# Patient Record
Sex: Female | Born: 1945 | Race: White | Hispanic: No | Smoking: Never smoker
Health system: Southern US, Community
[De-identification: ages and names within clinical notes are randomized; demographics above are authoritative.]

## PROBLEM LIST (undated history)

## (undated) DIAGNOSIS — I10 Essential (primary) hypertension: Secondary | ICD-10-CM

## (undated) DIAGNOSIS — C50919 Malignant neoplasm of unspecified site of unspecified female breast: Secondary | ICD-10-CM

## (undated) DIAGNOSIS — I639 Cerebral infarction, unspecified: Secondary | ICD-10-CM

## (undated) DIAGNOSIS — I509 Heart failure, unspecified: Secondary | ICD-10-CM

## (undated) DIAGNOSIS — M353 Polymyalgia rheumatica: Secondary | ICD-10-CM

## (undated) DIAGNOSIS — F419 Anxiety disorder, unspecified: Secondary | ICD-10-CM

## (undated) DIAGNOSIS — J189 Pneumonia, unspecified organism: Secondary | ICD-10-CM

## (undated) HISTORY — DX: Cerebral infarction, unspecified: I63.9

---

## 1997-04-07 DIAGNOSIS — Z8572 Personal history of non-Hodgkin lymphomas: Secondary | ICD-10-CM | POA: Insufficient documentation

## 2013-09-02 DIAGNOSIS — M353 Polymyalgia rheumatica: Secondary | ICD-10-CM

## 2013-11-07 DIAGNOSIS — F32A Depression, unspecified: Secondary | ICD-10-CM | POA: Insufficient documentation

## 2015-12-15 ENCOUNTER — Emergency Department (HOSPITAL_COMMUNITY): Payer: Medicare Other

## 2015-12-15 ENCOUNTER — Emergency Department (HOSPITAL_COMMUNITY)
Admission: EM | Admit: 2015-12-15 | Discharge: 2015-12-15 | Disposition: A | Discharge status: Home / Self Care | Payer: Medicare Other | Attending: Emergency Medicine | Admitting: Emergency Medicine

## 2015-12-15 ENCOUNTER — Encounter (HOSPITAL_COMMUNITY): Payer: Self-pay | Admitting: Emergency Medicine

## 2015-12-15 DIAGNOSIS — S90512A Abrasion, left ankle, initial encounter: Secondary | ICD-10-CM | POA: Diagnosis not present

## 2015-12-15 DIAGNOSIS — Y929 Unspecified place or not applicable: Secondary | ICD-10-CM | POA: Diagnosis not present

## 2015-12-15 DIAGNOSIS — Z853 Personal history of malignant neoplasm of breast: Secondary | ICD-10-CM | POA: Insufficient documentation

## 2015-12-15 DIAGNOSIS — Y939 Activity, unspecified: Secondary | ICD-10-CM | POA: Insufficient documentation

## 2015-12-15 DIAGNOSIS — S5002XA Contusion of left elbow, initial encounter: Secondary | ICD-10-CM | POA: Diagnosis not present

## 2015-12-15 DIAGNOSIS — S50811A Abrasion of right forearm, initial encounter: Secondary | ICD-10-CM | POA: Diagnosis not present

## 2015-12-15 DIAGNOSIS — S300XXA Contusion of lower back and pelvis, initial encounter: Secondary | ICD-10-CM | POA: Diagnosis not present

## 2015-12-15 DIAGNOSIS — Z79899 Other long term (current) drug therapy: Secondary | ICD-10-CM | POA: Insufficient documentation

## 2015-12-15 DIAGNOSIS — S0083XA Contusion of other part of head, initial encounter: Secondary | ICD-10-CM | POA: Insufficient documentation

## 2015-12-15 DIAGNOSIS — Y999 Unspecified external cause status: Secondary | ICD-10-CM | POA: Diagnosis not present

## 2015-12-15 DIAGNOSIS — W010XXA Fall on same level from slipping, tripping and stumbling without subsequent striking against object, initial encounter: Secondary | ICD-10-CM | POA: Diagnosis not present

## 2015-12-15 DIAGNOSIS — W100XXA Fall (on)(from) escalator, initial encounter: Secondary | ICD-10-CM | POA: Diagnosis not present

## 2015-12-15 DIAGNOSIS — W19XXXA Unspecified fall, initial encounter: Secondary | ICD-10-CM

## 2015-12-15 DIAGNOSIS — T148XXA Other injury of unspecified body region, initial encounter: Secondary | ICD-10-CM

## 2015-12-15 DIAGNOSIS — M25512 Pain in left shoulder: Secondary | ICD-10-CM | POA: Diagnosis present

## 2015-12-15 DIAGNOSIS — I10 Essential (primary) hypertension: Secondary | ICD-10-CM | POA: Diagnosis not present

## 2015-12-15 HISTORY — DX: Essential (primary) hypertension: I10

## 2015-12-15 HISTORY — DX: Anxiety disorder, unspecified: F41.9

## 2015-12-15 HISTORY — DX: Malignant neoplasm of unspecified site of unspecified female breast: C50.919

## 2015-12-15 HISTORY — DX: Polymyalgia rheumatica: M35.3

## 2015-12-15 MED ORDER — CYCLOBENZAPRINE HCL 10 MG PO TABS
10.0000 mg | ORAL_TABLET | Freq: Once | ORAL | Status: AC
  Administered 2015-12-15: 10 mg via ORAL
  Filled 2015-12-15: qty 1

## 2015-12-15 MED ORDER — CYCLOBENZAPRINE HCL 10 MG PO TABS: 10.0000 mg | ORAL_TABLET | Freq: Two times a day (BID) | ORAL | 0 refills | Status: DC | PRN

## 2015-12-15 MED ORDER — ACETAMINOPHEN 500 MG PO TABS
1000.0000 mg | ORAL_TABLET | Freq: Once | ORAL | Status: AC
  Administered 2015-12-15: 1000 mg via ORAL
  Filled 2015-12-15: qty 2

## 2015-12-15 NOTE — ED Triage Notes (Signed)
Pt fell while going up escalator today. Fell onto L side, but caught herself. Pt has abrasion to L elbow and wrist. Also complains of L shoulder pain and L buttock pain. Pt ambulatory

## 2015-12-15 NOTE — ED Notes (Signed)
Patient was on escalator earlier today and fell backward after tripping over her grandson.  Patient presents with left shoulder pain and bruising, but full rom in LUE.  Patient also has bruising and abrasion to left lateral forearm.  Patient also has bruising and abrasions consistent with escalator tracks to upper left lateral thigh.  Patient denies hitting head or LOC.  Patient's last tetanus was in 2013 according to PCP office.

## 2015-12-15 NOTE — ED Provider Notes (Signed)
[facility] DEPT Provider Note   CSN: [id number] Arrival date & time: 12/15/15  1403  First Provider Contact:  None       History   Chief Complaint Chief Complaint  Patient presents with  . Fall  . Shoulder Injury    HPI Melissa Sosa is a 69 y.o. female.  HPI  Was going up the escalator and grandson was afraid and turned to get him and stepped of for lost balance and fell back and 2 ladies on escalator broke her fall and held her, fell to left side and they helped hold her, hit elbow, left hip, hit, scraped edge of escalator. Pt reports she may have fallen down all the way but it happened so fast. No LOC.  Mild headache (woke up with one this AM) No vomiting  Left shoulder pain and left hip pain.  8/10 on arrival but has eased up a little. Has not had anything yet for pain. Stinging burning pain but when move is worse.  Able to ambulate following fall   Past Medical History:  Diagnosis Date  . Anxiety   . Breast cancer ([place])   . Hypertension   . PMR (polymyalgia rheumatica) (HCC)     There are no active problems to display for this patient.   History reviewed. No pertinent surgical history.  OB History    No data available       Home Medications    Prior to Admission medications   Medication Sig Start Date End Date Taking? Authorizing Provider  Cholecalciferol (VITAMIN D3) 2000 units capsule Take 2,000 Units by mouth daily.   Yes Historical Provider, MD  Cyanocobalamin (VITAMIN B 12) 100 MCG LOZG Take 100 mcg by mouth daily.   Yes Historical Provider, MD  Ginger 500 MG CAPS Take 500 mg by mouth daily.   Yes Historical Provider, MD  lisinopril-hydrochlorothiazide (PRINZIDE,ZESTORETIC) 10-12.5 MG tablet Take 0.5 tablets by mouth daily.   Yes Historical Provider, MD  nortriptyline (PAMELOR) 25 MG capsule Take 25 mg by mouth at bedtime.   Yes Historical Provider, MD  Omega-3 Fatty Acids (FISH OIL) 500 MG CAPS Take 1,000 mg by mouth daily.   Yes Historical  Provider, MD  predniSONE (DELTASONE) 10 MG tablet Take 5 mg by mouth daily.   Yes Historical Provider, MD  tamoxifen (NOLVADEX) 20 MG tablet Take 20 mg by mouth at bedtime.   Yes Historical Provider, MD  Turmeric 500 MG CAPS Take 500 mg by mouth daily.   Yes Historical Provider, MD  cyclobenzaprine (FLEXERIL) 10 MG tablet Take 1 tablet (10 mg total) by mouth 2 (two) times daily as needed for muscle spasms. 12/15/15   Gareth Morgan, MD    Family History History reviewed. No pertinent family history.  Social History Social History  Substance Use Topics  . Smoking status: Never Smoker  . Smokeless tobacco: Never Used  . Alcohol use No     Allergies   Imitrex [sumatriptan] and Morphine and related   Review of Systems Review of Systems  Constitutional: Negative for fever.  HENT: Negative for sore throat.   Eyes: Negative for visual disturbance.  Respiratory: Negative for cough and shortness of breath.   Cardiovascular: Negative for chest pain.  Gastrointestinal: Negative for abdominal pain, nausea and vomiting.  Genitourinary: Negative for difficulty urinating.  Musculoskeletal: Positive for arthralgias, myalgias and neck pain. Negative for back pain.  Skin: Positive for wound. Negative for rash.  Neurological: Positive for headaches (woke up with mild headache). Negative  for syncope, weakness and numbness.     Physical Exam Updated Vital Signs BP 133/86 (BP Location: Right Arm)   Pulse 88   Temp 98.4 F (36.9 C) (Oral)   Resp 16   SpO2 98%   Physical Exam  Constitutional: She is oriented to person, place, and time. She appears well-developed and well-nourished. No distress.  HENT:  Head: Normocephalic. Head is with contusion (right occiput). Head is without raccoon's eyes and without Battle's sign.  Right Ear: No hemotympanum.  Left Ear: No hemotympanum.  Eyes: Conjunctivae and EOM are normal.  Neck: Normal range of motion.  Cardiovascular: Normal rate, regular  rhythm, normal heart sounds and intact distal pulses.  Exam reveals no gallop and no friction rub.   No murmur heard. Pulmonary/Chest: Effort normal and breath sounds normal. No respiratory distress. She has no wheezes. She has no rales.  Abdominal: Soft. She exhibits no distension. There is no tenderness. There is no guarding.  Musculoskeletal: She exhibits no edema.       Right shoulder: She exhibits normal range of motion, no bony tenderness and no deformity.       Left shoulder: She exhibits tenderness (L trapezius). She exhibits normal range of motion, no deformity, no laceration and normal pulse.       Right hip: She exhibits normal range of motion and normal strength.       Left hip: She exhibits normal range of motion and normal strength.       Cervical back: She exhibits no bony tenderness.       Thoracic back: She exhibits no bony tenderness.       Lumbar back: She exhibits no bony tenderness.  Neurological: She is alert and oriented to person, place, and time. She has normal strength. No sensory deficit. GCS eye subscore is 4. GCS verbal subscore is 5. GCS motor subscore is 6.  Skin: Skin is warm and dry. No rash noted. She is not diaphoretic. There is erythema (vertical erythematous lines over back, left elbow with abrasion/contusion, left buttock with contusion/abrasion, left ankle abrasion, right forearm abrasions).  Nursing note and vitals reviewed.    ED Treatments / Results  Labs (all labs ordered are listed, but only abnormal results are displayed) Labs Reviewed - No data to display  EKG  EKG Interpretation None       Radiology Dg Shoulder Left  Result Date: 12/15/2015 CLINICAL DATA:  Fall backwards on escalator.  Left shoulder pain. EXAM: LEFT SHOULDER - 2+ VIEW COMPARISON:  None. FINDINGS: Mild degenerative changes in the left [facility] joint. No acute bony abnormality. Specifically, no fracture, subluxation, or dislocation. Soft tissues are intact. IMPRESSION: No acute  bony abnormality. Electronically Signed   By: Rolm Baptise M.D.   On: 12/15/2015 15:09   Procedures Procedures (including critical care time)  Medications Ordered in ED Medications  acetaminophen (TYLENOL) tablet 1,000 mg (1,000 mg Oral Given 12/15/15 1640)  cyclobenzaprine (FLEXERIL) tablet 10 mg (10 mg Oral Given 12/15/15 1640)     Initial Impression / Assessment and Plan / ED Course  I have reviewed the triage vital signs and the nursing notes.  Pertinent labs & imaging results that were available during my care of the patient were reviewed by me and considered in my medical decision making (see chart for details).  Clinical Course   70yo female with history of uterine cancer, breast cancer, Nonhodgkins lymphoma, htn, polymyalgia rheumatica presents with concern for fall on the escalator.  Patient with normal  mentation, no vomiting, no sign of skull fx on exam, not on anticoagulation, no LOC. Discussed that given age, I have low threshold to CT however patient and son would prefer to monitor symptoms.  Pt with contusion and abrasion around left hip, likely etiology of this pain, however able to ambulate, full ROM and doubt fracture or bony injury.  XR of left shoulder without acute abnormalities. No cervical, thoracic or lumbar spine tenderness.  Patient with several areas of contusion, abrasion.  Given flexeril, recommend prn outpatient follow up, wound care.   Final Clinical Impressions(s) / ED Diagnoses   Final diagnoses:  Fall, initial encounter  Abrasion  Contusion    New Prescriptions Discharge Medication List as of 12/15/2015  4:53 PM    START taking these medications   Details  cyclobenzaprine (FLEXERIL) 10 MG tablet Take 1 tablet (10 mg total) by mouth 2 (two) times daily as needed for muscle spasms., Starting Thu 12/15/2015, Print         Gareth Morgan, MD 12/15/15 2159

## 2019-07-15 ENCOUNTER — Ambulatory Visit: Payer: Medicare Other

## 2019-10-31 ENCOUNTER — Other Ambulatory Visit: Payer: Self-pay

## 2019-10-31 ENCOUNTER — Encounter (HOSPITAL_BASED_OUTPATIENT_CLINIC_OR_DEPARTMENT_OTHER): Payer: Self-pay | Admitting: Emergency Medicine

## 2019-10-31 ENCOUNTER — Emergency Department (HOSPITAL_BASED_OUTPATIENT_CLINIC_OR_DEPARTMENT_OTHER): Payer: Medicare Other

## 2019-10-31 ENCOUNTER — Emergency Department (HOSPITAL_BASED_OUTPATIENT_CLINIC_OR_DEPARTMENT_OTHER)
Admission: EM | Admit: 2019-10-31 | Discharge: 2019-10-31 | Disposition: A | Discharge status: Home / Self Care | Payer: Medicare Other | Attending: Emergency Medicine | Admitting: Emergency Medicine

## 2019-10-31 DIAGNOSIS — I10 Essential (primary) hypertension: Secondary | ICD-10-CM | POA: Diagnosis not present

## 2019-10-31 DIAGNOSIS — R0789 Other chest pain: Secondary | ICD-10-CM | POA: Diagnosis not present

## 2019-10-31 DIAGNOSIS — R05 Cough: Secondary | ICD-10-CM | POA: Diagnosis present

## 2019-10-31 DIAGNOSIS — Z20822 Contact with and (suspected) exposure to covid-19: Secondary | ICD-10-CM | POA: Diagnosis not present

## 2019-10-31 DIAGNOSIS — Z79899 Other long term (current) drug therapy: Secondary | ICD-10-CM | POA: Diagnosis not present

## 2019-10-31 DIAGNOSIS — J069 Acute upper respiratory infection, unspecified: Secondary | ICD-10-CM

## 2019-10-31 HISTORY — DX: Pneumonia, unspecified organism: J18.9

## 2019-10-31 LAB — TROPONIN I (HIGH SENSITIVITY)
Troponin I (High Sensitivity): 7 ng/L (ref <18)
Troponin I (High Sensitivity): 6 ng/L (ref <18)

## 2019-10-31 LAB — CBC WITH DIFFERENTIAL/PLATELET
Abs Immature Granulocytes: 0.03 10*3/uL (ref 0.00–0.07)
Basophils Absolute: 0.1 10*3/uL (ref 0.0–0.1)
Basophils Relative: 1 %
Eosinophils Absolute: 0.3 10*3/uL (ref 0.0–0.5)
Eosinophils Relative: 4 %
HCT: 41.5 % (ref 36.0–46.0)
Hemoglobin: 13.3 g/dL (ref 12.0–15.0)
Immature Granulocytes: 0 %
Lymphocytes Relative: 20 %
Lymphs Abs: 1.6 10*3/uL (ref 0.7–4.0)
MCH: 27.1 pg (ref 26.0–34.0)
MCHC: 32 g/dL (ref 30.0–36.0)
MCV: 84.5 fL (ref 80.0–100.0)
Monocytes Absolute: 0.8 10*3/uL (ref 0.1–1.0)
Monocytes Relative: 10 %
Neutro Abs: 5.4 10*3/uL (ref 1.7–7.7)
Neutrophils Relative %: 65 %
Platelets: 304 10*3/uL (ref 150–400)
RBC: 4.91 MIL/uL (ref 3.87–5.11)
RDW: 14.3 % (ref 11.5–15.5)
WBC: 8.2 10*3/uL (ref 4.0–10.5)
nRBC: 0 % (ref 0.0–0.2)

## 2019-10-31 LAB — BASIC METABOLIC PANEL
Anion gap: 11 (ref 5–15)
BUN: 19 mg/dL (ref 8–23)
CO2: 23 mmol/L (ref 22–32)
Calcium: 9.4 mg/dL (ref 8.9–10.3)
Chloride: 105 mmol/L (ref 98–111)
Creatinine, Ser: 0.97 mg/dL (ref 0.44–1.00)
GFR calc Af Amer: >60 mL/min (ref >60)
GFR calc non Af Amer: 58 mL/min — ABNORMAL LOW (ref >60)
Glucose, Bld: 108 mg/dL — ABNORMAL HIGH (ref 70–99)
Potassium: 3.8 mmol/L (ref 3.5–5.1)
Sodium: 139 mmol/L (ref 135–145)

## 2019-10-31 LAB — SARS CORONAVIRUS 2 BY RT PCR (HOSPITAL ORDER, PERFORMED IN ~~LOC~~ HOSPITAL LAB): SARS Coronavirus 2: NEGATIVE

## 2019-10-31 MED ORDER — ACETAMINOPHEN 325 MG PO TABS
650.0000 mg | ORAL_TABLET | Freq: Once | ORAL | Status: AC
  Administered 2019-10-31: 650 mg via ORAL
  Filled 2019-10-31: qty 2

## 2019-10-31 NOTE — ED Provider Notes (Signed)
[facility] EMERGENCY DEPARTMENT Provider Note   CSN: [id number] Arrival date & time: 10/31/19  1610     History Chief Complaint  Patient presents with  . Cough  . Chest Pain    Melissa Sosa is a 74 y.o. female.  The history is provided by the patient.  Cough Cough characteristics:  Dry, hacking and non-productive Severity:  Moderate Onset quality:  Gradual Duration:  24 hours Timing:  Constant Progression:  Worsening Chronicity:  New Smoker: no   Context: upper respiratory infection   Context comment:  Low grade fever today and ongoing cough. states feels tight in the chest but no SOB Relieved by:  None tried Worsened by:  Nothing Ineffective treatments:  None tried Associated symptoms: chest pain, fever and myalgias   Associated symptoms: no ear pain, no eye discharge, no shortness of breath, no sinus congestion, no sore throat and no wheezing   Associated symptoms comment:  Since yesterday has just had tightness in the entire chest which is slightly worse when lying Chest Pain Associated symptoms: cough and fever   Associated symptoms: no shortness of breath        Past Medical History:  Diagnosis Date  . Anxiety   . Breast cancer ([place])   . Hypertension   . PMR (polymyalgia rheumatica) (HCC)   . Pneumonia     There are no problems to display for this patient.   History reviewed. No pertinent surgical history.   OB History   No obstetric history on file.     No family history on file.  Social History   Tobacco Use  . Smoking status: Never Smoker  . Smokeless tobacco: Never Used  Vaping Use  . Vaping Use: Never used  Substance Use Topics  . Alcohol use: No  . Drug use: Never    Home Medications Prior to Admission medications   Medication Sig Start Date End Date Taking? Authorizing Provider  Cholecalciferol (VITAMIN D3) 2000 units capsule Take 2,000 Units by mouth daily.    [provider]  Cyanocobalamin (VITAMIN B  12) 100 MCG LOZG Take 100 mcg by mouth daily.    [provider]  cyclobenzaprine (FLEXERIL) 10 MG tablet Take 1 tablet (10 mg total) by mouth 2 (two) times daily as needed for muscle spasms. 12/15/15   Gareth Morgan, MD  Ginger 500 MG CAPS Take 500 mg by mouth daily.    [provider]  lisinopril-hydrochlorothiazide (PRINZIDE,ZESTORETIC) 10-12.5 MG tablet Take 0.5 tablets by mouth daily.    [provider]  nortriptyline (PAMELOR) 25 MG capsule Take 25 mg by mouth at bedtime.    [provider]  Omega-3 Fatty Acids (FISH OIL) 500 MG CAPS Take 1,000 mg by mouth daily.    [provider]  predniSONE (DELTASONE) 10 MG tablet Take 5 mg by mouth daily.    [provider]  tamoxifen (NOLVADEX) 20 MG tablet Take 20 mg by mouth at bedtime.    [provider]  Turmeric 500 MG CAPS Take 500 mg by mouth daily.    [provider]    Allergies    Imitrex [sumatriptan] and Morphine and related  Review of Systems   Review of Systems  Constitutional: Positive for fever.  HENT: Negative for ear pain and sore throat.   Eyes: Negative for discharge.  Respiratory: Positive for cough. Negative for shortness of breath and wheezing.   Cardiovascular: Positive for chest pain.  Musculoskeletal: Positive for myalgias.  All other  systems reviewed and are negative.   Physical Exam Updated Vital Signs BP 114/75 (BP Location: Right Arm)   Pulse (!) 113   Temp 99.8 F (37.7 C) (Oral)   Resp 16   Ht 5\' 6"  (1.676 m)   Wt 90.7 kg   SpO2 100%   BMI 32.28 kg/m   Physical Exam Vitals and nursing note reviewed.  Constitutional:      General: She is not in acute distress.    Appearance: She is well-developed and normal weight.  HENT:     Head: Normocephalic and atraumatic.     Mouth/Throat:     Mouth: Mucous membranes are moist.  Eyes:     Pupils: Pupils are equal, round, and reactive to light.  Cardiovascular:     Rate and  Rhythm: Regular rhythm. Tachycardia present.     Pulses: Normal pulses.     Heart sounds: Normal heart sounds. No murmur heard.  No friction rub.  Pulmonary:     Effort: Pulmonary effort is normal.     Breath sounds: Normal breath sounds. No wheezing or rales.  Abdominal:     General: Bowel sounds are normal. There is no distension.     Palpations: Abdomen is soft.     Tenderness: There is no abdominal tenderness. There is no guarding or rebound.  Musculoskeletal:        General: No tenderness. Normal range of motion.     Cervical back: Normal range of motion.     Right lower leg: No edema.     Left lower leg: No edema.     Comments: No edema  Skin:    General: Skin is warm and dry.     Findings: No rash.  Neurological:     General: No focal deficit present.     Mental Status: She is alert and oriented to person, place, and time. Mental status is at baseline.     Cranial Nerves: No cranial nerve deficit.  Psychiatric:        Mood and Affect: Mood normal.        Behavior: Behavior normal.        Thought Content: Thought content normal.      ED Results / Procedures / Treatments   Labs (all labs ordered are listed, but only abnormal results are displayed) Labs Reviewed  SARS CORONAVIRUS 2 BY RT PCR (HOSPITAL ORDER, [facility])  CBC WITH DIFFERENTIAL/PLATELET  BASIC METABOLIC PANEL  TROPONIN I (HIGH SENSITIVITY)    EKG EKG Interpretation  Date/Time:  Saturday October 31 2019 16:23:29 EDT Ventricular Rate:  111 PR Interval:    QRS Duration: 91 QT Interval:  330 QTC Calculation: 449 R Axis:   41 Text Interpretation: Sinus tachycardia Abnormal R-wave progression, early transition Nonspecific T abnormalities, lateral leads Baseline wander in lead(s) III aVL V2 V4 No previous tracing Confirmed by Blanchie Dessert [phone]) on 10/31/2019 4:39:51 PM   Radiology No results found.  Procedures Procedures (including critical care time)  Medications  Ordered in ED Medications  acetaminophen (TYLENOL) tablet 650 mg (650 mg Oral Given 10/31/19 1713)    ED Course  I have reviewed the triage vital signs and the nursing notes.  Pertinent labs & imaging results that were available during my care of the patient were reviewed by me and considered in my medical decision making (see chart for details).    MDM Rules/Calculators/A&P  Patient is a 74 year old female presenting today with 24 hours of cough and tightness in her chest.  Patient had low-grade temperature of 99.8 here.  She was seen at [facility] clinic but they sent her here to make sure there was no other acute cause of her symptoms.  Patient has been vaccinated against Covid and uses protective precautions.  She is in no acute distress at this time and denies any shortness of breath.  Satting 100% on room air.  Patient was mildly tachycardic upon arrival here but then heart rate improved to the mid 90s after patient received Tylenol.  She has no wheezing on exam and is in no distress.  Patient's EKG shows nonspecific T waves without old to compare but no evidence of ACS at this time.  She has no radiation of her symptoms and seems more infectious than cardiac.  Troponin was 7 and that is after having pain for 24 hours.  Chest x-ray without sign of infiltrates.  Covid is negative, CBC and CMP within normal limits.  Findings discussed with patient.  Feel it is reasonable to allow patient to go home symptomatic treatment for URI and follow-up with physician or return if symptoms worsen.  MDM Number of Diagnoses or Management Options   Amount and/or Complexity of Data Reviewed Clinical lab tests: ordered and reviewed Tests in the radiology section of CPT: ordered and reviewed Tests in the medicine section of CPT: ordered and reviewed Obtain history from someone other than the patient: no Review and summarize past medical records: yes Discuss the patient with other  providers: no Independent visualization of images, tracings, or specimens: yes  Risk of Complications, Morbidity, and/or Mortality Presenting problems: moderate Diagnostic procedures: low Management options: low  Patient Progress Patient progress: improved  Final Clinical Impression(s) / ED Diagnoses Final diagnoses:  Viral upper respiratory tract infection    Rx / DC Orders ED Discharge Orders    None       Blanchie Dessert, MD 10/31/19 1930

## 2019-10-31 NOTE — ED Triage Notes (Signed)
Pt c/o having a dry cough and tightness in middle of chest.  Pt had COVID vaccine last in February.

## 2019-10-31 NOTE — Discharge Instructions (Signed)
Everything with your heart and your x-ray today were normal.  This most likely is a viral bug however if you start developing shortness of breath, vomiting, high fevers please return to the emergency room.  If you are just not getting any better follow-up with your doctor next week.  You can try regular Mucinex as well as Tylenol for low-grade temperatures.

## 2020-09-21 DIAGNOSIS — M353 Polymyalgia rheumatica: Secondary | ICD-10-CM | POA: Diagnosis not present

## 2020-09-21 DIAGNOSIS — Z7952 Long term (current) use of systemic steroids: Secondary | ICD-10-CM | POA: Diagnosis not present

## 2020-09-21 DIAGNOSIS — Z8572 Personal history of non-Hodgkin lymphomas: Secondary | ICD-10-CM | POA: Diagnosis not present

## 2020-09-21 DIAGNOSIS — M1990 Unspecified osteoarthritis, unspecified site: Secondary | ICD-10-CM | POA: Diagnosis not present

## 2020-10-10 DIAGNOSIS — L281 Prurigo nodularis: Secondary | ICD-10-CM | POA: Diagnosis not present

## 2020-10-10 DIAGNOSIS — L820 Inflamed seborrheic keratosis: Secondary | ICD-10-CM | POA: Diagnosis not present

## 2020-10-11 DIAGNOSIS — J479 Bronchiectasis, uncomplicated: Secondary | ICD-10-CM | POA: Insufficient documentation

## 2020-10-11 DIAGNOSIS — M25562 Pain in left knee: Secondary | ICD-10-CM | POA: Diagnosis not present

## 2020-10-11 DIAGNOSIS — F411 Generalized anxiety disorder: Secondary | ICD-10-CM | POA: Insufficient documentation

## 2020-11-07 ENCOUNTER — Emergency Department (HOSPITAL_BASED_OUTPATIENT_CLINIC_OR_DEPARTMENT_OTHER)
Admission: EM | Admit: 2020-11-07 | Discharge: 2020-11-07 | Disposition: A | Discharge status: Home / Self Care | Payer: Medicare Other | Attending: Emergency Medicine | Admitting: Emergency Medicine

## 2020-11-07 ENCOUNTER — Other Ambulatory Visit: Payer: Self-pay

## 2020-11-07 ENCOUNTER — Emergency Department (HOSPITAL_BASED_OUTPATIENT_CLINIC_OR_DEPARTMENT_OTHER): Payer: Medicare Other

## 2020-11-07 ENCOUNTER — Encounter (HOSPITAL_BASED_OUTPATIENT_CLINIC_OR_DEPARTMENT_OTHER): Payer: Self-pay | Admitting: Radiology

## 2020-11-07 DIAGNOSIS — R000 Tachycardia, unspecified: Secondary | ICD-10-CM | POA: Diagnosis not present

## 2020-11-07 DIAGNOSIS — Z853 Personal history of malignant neoplasm of breast: Secondary | ICD-10-CM | POA: Insufficient documentation

## 2020-11-07 DIAGNOSIS — Z79899 Other long term (current) drug therapy: Secondary | ICD-10-CM | POA: Insufficient documentation

## 2020-11-07 DIAGNOSIS — R1110 Vomiting, unspecified: Secondary | ICD-10-CM | POA: Diagnosis not present

## 2020-11-07 DIAGNOSIS — Z20822 Contact with and (suspected) exposure to covid-19: Secondary | ICD-10-CM | POA: Diagnosis not present

## 2020-11-07 DIAGNOSIS — R109 Unspecified abdominal pain: Secondary | ICD-10-CM | POA: Diagnosis not present

## 2020-11-07 DIAGNOSIS — R5383 Other fatigue: Secondary | ICD-10-CM | POA: Diagnosis not present

## 2020-11-07 DIAGNOSIS — R112 Nausea with vomiting, unspecified: Secondary | ICD-10-CM

## 2020-11-07 DIAGNOSIS — I10 Essential (primary) hypertension: Secondary | ICD-10-CM | POA: Insufficient documentation

## 2020-11-07 DIAGNOSIS — R531 Weakness: Secondary | ICD-10-CM | POA: Diagnosis present

## 2020-11-07 DIAGNOSIS — R509 Fever, unspecified: Secondary | ICD-10-CM | POA: Diagnosis not present

## 2020-11-07 DIAGNOSIS — R197 Diarrhea, unspecified: Secondary | ICD-10-CM | POA: Insufficient documentation

## 2020-11-07 LAB — COMPREHENSIVE METABOLIC PANEL
ALT: 24 U/L (ref 0–44)
AST: 36 U/L (ref 15–41)
Albumin: 3.6 g/dL (ref 3.5–5.0)
Alkaline Phosphatase: 69 U/L (ref 38–126)
Anion gap: 8 (ref 5–15)
BUN: 11 mg/dL (ref 8–23)
CO2: 29 mmol/L (ref 22–32)
Calcium: 10 mg/dL (ref 8.9–10.3)
Chloride: 102 mmol/L (ref 98–111)
Creatinine, Ser: 0.89 mg/dL (ref 0.44–1.00)
GFR, Estimated: >60 mL/min (ref >60)
Glucose, Bld: 121 mg/dL — ABNORMAL HIGH (ref 70–99)
Potassium: 3.4 mmol/L — ABNORMAL LOW (ref 3.5–5.1)
Sodium: 139 mmol/L (ref 135–145)
Total Bilirubin: 0.3 mg/dL (ref 0.3–1.2)
Total Protein: 7.4 g/dL (ref 6.5–8.1)

## 2020-11-07 LAB — URINALYSIS, ROUTINE W REFLEX MICROSCOPIC
Bilirubin Urine: NEGATIVE
Glucose, UA: NEGATIVE mg/dL
Hgb urine dipstick: NEGATIVE
Ketones, ur: NEGATIVE mg/dL
Nitrite: NEGATIVE
Protein, ur: NEGATIVE mg/dL
Specific Gravity, Urine: <1.005 — ABNORMAL LOW (ref 1.005–1.030)
pH: 6.5 (ref 5.0–8.0)

## 2020-11-07 LAB — RESP PANEL BY RT-PCR (FLU A&B, COVID) ARPGX2
Influenza A by PCR: NEGATIVE
Influenza B by PCR: NEGATIVE
SARS Coronavirus 2 by RT PCR: NEGATIVE

## 2020-11-07 LAB — CBC
HCT: 41.8 % (ref 36.0–46.0)
Hemoglobin: 13.6 g/dL (ref 12.0–15.0)
MCH: 27.4 pg (ref 26.0–34.0)
MCHC: 32.5 g/dL (ref 30.0–36.0)
MCV: 84.1 fL (ref 80.0–100.0)
Platelets: 360 10*3/uL (ref 150–400)
RBC: 4.97 MIL/uL (ref 3.87–5.11)
RDW: 14.1 % (ref 11.5–15.5)
WBC: 6.1 10*3/uL (ref 4.0–10.5)
nRBC: 0 % (ref 0.0–0.2)

## 2020-11-07 LAB — URINALYSIS, MICROSCOPIC (REFLEX)

## 2020-11-07 LAB — LIPASE, BLOOD: Lipase: 23 U/L (ref 11–51)

## 2020-11-07 MED ORDER — ONDANSETRON HCL 4 MG PO TABS: 4.0000 mg | ORAL_TABLET | Freq: Three times a day (TID) | ORAL | 0 refills | Status: DC | PRN

## 2020-11-07 MED ORDER — SODIUM CHLORIDE 0.9 % IV BOLUS
1000.0000 mL | Freq: Once | INTRAVENOUS | Status: AC
  Administered 2020-11-07: 1000 mL via INTRAVENOUS

## 2020-11-07 MED ORDER — PANTOPRAZOLE SODIUM 20 MG PO TBEC: 20.0000 mg | DELAYED_RELEASE_TABLET | Freq: Every day | ORAL | 0 refills | Status: DC

## 2020-11-07 MED ORDER — ONDANSETRON 4 MG PO TBDP
ORAL_TABLET | ORAL | Status: AC
  Filled 2020-11-07: qty 1

## 2020-11-07 MED ORDER — ONDANSETRON 4 MG PO TBDP
4.0000 mg | ORAL_TABLET | Freq: Once | ORAL | Status: AC | PRN
  Administered 2020-11-07: 4 mg via ORAL

## 2020-11-07 MED ORDER — IOHEXOL 300 MG/ML  SOLN
100.0000 mL | Freq: Once | INTRAMUSCULAR | Status: AC | PRN
  Administered 2020-11-07: 100 mL via INTRAVENOUS

## 2020-11-07 NOTE — ED Notes (Signed)
Patient denies pain and is resting comfortably.  

## 2020-11-07 NOTE — ED Notes (Signed)
Aware of need for urine specimen, unable to provide at this time, IV fluids infusing

## 2020-11-07 NOTE — ED Triage Notes (Signed)
Pt states Tuesday night started having abdominal pain w/ vomiting & diarrhea. States some diarrhea since and nausea continues. Able to tolerate PO.

## 2020-11-07 NOTE — Discharge Instructions (Addendum)
You are seen in the emergency department for evaluation of of feeling fatigued after a bout of nausea vomiting diarrhea.  Your lab work did not show any significant abnormalities.  Your CAT scan also did not show any acute findings.  There were some incidental findings on the CAT scan that may need follow-up with your primary care doctor.  Included below is the report.  We are prescribing you some nausea medication along with some acid medication.  Please follow-up with your primary care doctor.  Return to the emergency department if any worsening or concerning symptoms  IMPRESSION:  1. No acute findings within the abdomen or pelvis. No bowel  inflammation.  2. Small left pleural effusion associated with linear lung base  opacities, the latter finding consistent with atelectasis and/or  scarring.  3. Mild aortic atherosclerosis.

## 2020-11-07 NOTE — ED Provider Notes (Signed)
[facility] EMERGENCY DEPARTMENT Provider Note   CSN: [id number] Arrival date & time: 11/07/20  1125     History Chief Complaint  Patient presents with   Nausea    Melissa Sosa is a 75 y.o. female.  She has a history of breast cancer and is on tamoxifen.  She said 6 days ago she thinks she had food poisoning.  Acute onset of crampy abdominal pain vomiting and diarrhea.  She has been drinking clear liquid diet since then and slowly advancing.  She still is having a little bit of loose stool but not much.  No more abdominal pain.  Still has low-grade temp.  Her main concern is that she still feels very fatigued, sleeping a lot.  Did a negative home COVID test.  Called her PCP today who recommended she come appearing get some IV fluids.  She said her heart rate and her blood pressure up which is unusual for her.  No recent antibiotics.  No blood in the stool.  The history is provided by the patient.  Weakness Severity:  Moderate Onset quality:  Gradual Duration:  6 days Timing:  Constant Progression:  Unchanged Chronicity:  New Context: dehydration and recent infection   Context: not urinary tract infection   Relieved by:  Nothing Worsened by:  Activity Ineffective treatments:  Drinking fluids Associated symptoms: abdominal pain, diarrhea, fever, nausea and vomiting   Associated symptoms: no chest pain, no cough, no dysuria, no falls, no foul-smelling urine, no frequency, no headaches, no loss of consciousness, no shortness of breath and no syncope       Past Medical History:  Diagnosis Date   Anxiety    Breast cancer ([place])    Hypertension    PMR (polymyalgia rheumatica) (HCC)    Pneumonia     There are no problems to display for this patient.   No past surgical history on file.   OB History   No obstetric history on file.     No family history on file.  Social History   Tobacco Use   Smoking status: Never   Smokeless tobacco: Never  Vaping Use    Vaping Use: Never used  Substance Use Topics   Alcohol use: No   Drug use: Never    Home Medications Prior to Admission medications   Medication Sig Start Date End Date Taking? Authorizing Provider  Cholecalciferol (VITAMIN D3) 2000 units capsule Take 2,000 Units by mouth daily.    [provider]  Cyanocobalamin (VITAMIN B 12) 100 MCG LOZG Take 100 mcg by mouth daily.    [provider]  cyclobenzaprine (FLEXERIL) 10 MG tablet Take 1 tablet (10 mg total) by mouth 2 (two) times daily as needed for muscle spasms. 12/15/15   Gareth Morgan, MD  Ginger 500 MG CAPS Take 500 mg by mouth daily.    [provider]  lisinopril-hydrochlorothiazide (PRINZIDE,ZESTORETIC) 10-12.5 MG tablet Take 0.5 tablets by mouth daily.    [provider]  nortriptyline (PAMELOR) 25 MG capsule Take 25 mg by mouth at bedtime.    [provider]  Omega-3 Fatty Acids (FISH OIL) 500 MG CAPS Take 1,000 mg by mouth daily.    [provider]  predniSONE (DELTASONE) 10 MG tablet Take 5 mg by mouth daily.    [provider]  tamoxifen (NOLVADEX) 20 MG tablet Take 20 mg by mouth at bedtime.    [provider]  Turmeric 500 MG CAPS Take 500 mg by mouth daily.  [provider]    Allergies    Imitrex [sumatriptan] and Morphine and related  Review of Systems   Review of Systems  Constitutional:  Positive for fatigue and fever.  HENT:  Negative for sore throat.   Eyes:  Negative for visual disturbance.  Respiratory:  Negative for cough and shortness of breath.   Cardiovascular:  Negative for chest pain and syncope.  Gastrointestinal:  Positive for abdominal pain, diarrhea, nausea and vomiting.  Genitourinary:  Negative for dysuria and frequency.  Musculoskeletal:  Negative for falls and neck pain.  Skin:  Negative for rash.  Neurological:  Positive for weakness. Negative for loss of consciousness and headaches.   Physical Exam Updated  Vital Signs BP (!) 143/83 (BP Location: Right Arm)   Pulse (!) 106   Temp 98.5 F (36.9 C) (Oral)   Resp 18   Ht 5\' 6"  (1.676 m)   Wt 90.7 kg   SpO2 100%   BMI 32.28 kg/m   Physical Exam Vitals and nursing note reviewed.  Constitutional:      General: She is not in acute distress.    Appearance: Normal appearance. She is well-developed.  HENT:     Head: Normocephalic and atraumatic.  Eyes:     Conjunctiva/sclera: Conjunctivae normal.  Cardiovascular:     Rate and Rhythm: Regular rhythm. Tachycardia present.     Heart sounds: No murmur heard. Pulmonary:     Effort: Pulmonary effort is normal. No respiratory distress.     Breath sounds: Normal breath sounds.  Abdominal:     Palpations: Abdomen is soft.     Tenderness: There is no abdominal tenderness.  Musculoskeletal:        General: No deformity or signs of injury. Normal range of motion.     Cervical back: Neck supple.     Right lower leg: No edema.     Left lower leg: No edema.  Skin:    General: Skin is warm and dry.     Capillary Refill: Capillary refill takes less than 2 seconds.  Neurological:     General: No focal deficit present.     Mental Status: She is alert.     Sensory: No sensory deficit.     Motor: No weakness.    ED Results / Procedures / Treatments   Labs (all labs ordered are listed, but only abnormal results are displayed) Labs Reviewed  COMPREHENSIVE METABOLIC PANEL - Abnormal; Notable for the following components:      Result Value   Potassium 3.4 (*)    Glucose, Bld 121 (*)    All other components within normal limits  URINALYSIS, ROUTINE W REFLEX MICROSCOPIC - Abnormal; Notable for the following components:   APPearance CLOUDY (*)    Specific Gravity, Urine <1.005 (*)    Leukocytes,Ua SMALL (*)    All other components within normal limits  URINALYSIS, MICROSCOPIC (REFLEX) - Abnormal; Notable for the following components:   Bacteria, UA FEW (*)    All other components within normal  limits  RESP PANEL BY RT-PCR (FLU A&B, COVID) ARPGX2  LIPASE, BLOOD  CBC    EKG None  Radiology CT Abdomen Pelvis W Contrast  Result Date: 11/07/2020 CLINICAL DATA:  Abdominal pain and fever. Nausea, vomiting and diarrhea for 7 days. EXAM: CT ABDOMEN AND PELVIS WITH CONTRAST TECHNIQUE: Multidetector CT imaging of the abdomen and pelvis was performed using the standard protocol following bolus administration of intravenous contrast. CONTRAST:  126mL OMNIPAQUE IOHEXOL 300 MG/ML  SOLN COMPARISON:  None. FINDINGS: Lower chest: Small left pleural effusion. Linear opacities at the left lung base consistent with atelectasis and/or scarring. No acute findings. Hepatobiliary: No focal liver abnormality is seen. Status post cholecystectomy. No biliary dilatation. Pancreas: Unremarkable. No pancreatic ductal dilatation or surrounding inflammatory changes. Spleen: Normal in size without focal abnormality. Adrenals/Urinary Tract: No adrenal masses. Kidneys normal in size, orientation and position. 4 mm low-attenuation lesion, lateral lower pole cortex of the right kidney, likely a cyst. No other renal masses, no stones and no hydronephrosis. Normal ureters. Normal bladder. Stomach/Bowel: Normal stomach. Small bowel and colon are normal in caliber. No wall thickening. No inflammation. Prominent fat at the ileocecal valve with twos small additional areas of fat within the ascending colon wall consistent with lipomas. Vascular/Lymphatic: Mild aortic atherosclerosis. No aneurysm. No enlarged lymph nodes. Reproductive: Status post hysterectomy. No adnexal masses. Other: No abdominal wall hernia or abnormality. No abdominopelvic ascites. Musculoskeletal: No fracture or acute finding.  No bone lesion. IMPRESSION: 1. No acute findings within the abdomen or pelvis. No bowel inflammation. 2. Small left pleural effusion associated with linear lung base opacities, the latter finding consistent with atelectasis and/or scarring.  3. Mild aortic atherosclerosis. Electronically Signed   By: Lajean Manes M.D.   On: 11/07/2020 13:54    Procedures Procedures   Medications Ordered in ED Medications  sodium chloride 0.9 % bolus 1,000 mL (has no administration in time range)  ondansetron (ZOFRAN-ODT) disintegrating tablet 4 mg (4 mg Oral Given 11/07/20 1139)  ondansetron (ZOFRAN-ODT) 4 MG disintegrating tablet (  Return to [facility] 11/07/20 1140)    ED Course  I have reviewed the triage vital signs and the nursing notes.  Pertinent labs & imaging results that were available during my care of the patient were reviewed by me and considered in my medical decision making (see chart for details).    MDM Rules/Calculators/A&P                         Junetta Hearn was evaluated in Emergency Department on 11/07/2020 for the symptoms described in the history of present illness. She was evaluated in the context of the global COVID-19 pandemic, which necessitated consideration that the patient might be at risk for infection with the SARS-CoV-2 virus that causes COVID-19. Institutional protocols and algorithms that pertain to the evaluation of patients at risk for COVID-19 are in a state of rapid change based on information released by regulatory bodies including the CDC and federal and state organizations. These policies and algorithms were followed during the patient's care in the ED.  This patient complains of weakness and fatigue, recent nausea vomiting diarrhea that seems improving; this involves an extensive number of treatment Options and is a complaint that carries with it a high risk of complications and Morbidity. The differential includes gastroenteritis, obstruction, diverticulitis, colitis, dehydration, renal failure, metabolic derangement, pancreatitis  I ordered, reviewed and interpreted labs, which included CBC with normal white count normal hemoglobin, chemistries fairly normal other than mildly low potassium, COVID and  flu testing negative, urinalysis fairly unremarkable I ordered medication IV fluids pain medication nausea medication I ordered imaging studies which included CT abdomen and pelvis and I independently    visualized and interpreted imaging which showed no acute findings, does have small effusion and atelectasis Previous records obtained and reviewed in epic, no recent admissions  After the interventions stated above, I reevaluated the patient and found patient to be feeling little  bit better.  Tachycardia resolved.  She is comfortable plan for treatment with Zofran and PPI and outpatient follow-up with PCP.  Return instructions discussed   Final Clinical Impression(s) / ED Diagnoses Final diagnoses:  Fatigue, unspecified type  Nausea vomiting and diarrhea    Rx / DC Orders ED Discharge Orders          Ordered    ondansetron (ZOFRAN) 4 MG tablet  Every 8 hours PRN        11/07/20 1424    pantoprazole (PROTONIX) 20 MG tablet  Daily        11/07/20 1424             Hayden Rasmussen, MD 11/07/20 1732

## 2020-12-09 DIAGNOSIS — M1712 Unilateral primary osteoarthritis, left knee: Secondary | ICD-10-CM | POA: Diagnosis not present

## 2021-02-20 DIAGNOSIS — J479 Bronchiectasis, uncomplicated: Secondary | ICD-10-CM | POA: Diagnosis not present

## 2021-02-20 DIAGNOSIS — B372 Candidiasis of skin and nail: Secondary | ICD-10-CM | POA: Diagnosis not present

## 2021-02-20 DIAGNOSIS — I700 Atherosclerosis of aorta: Secondary | ICD-10-CM | POA: Diagnosis not present

## 2021-02-27 DIAGNOSIS — G43909 Migraine, unspecified, not intractable, without status migrainosus: Secondary | ICD-10-CM | POA: Diagnosis not present

## 2021-02-27 DIAGNOSIS — H43813 Vitreous degeneration, bilateral: Secondary | ICD-10-CM | POA: Diagnosis not present

## 2021-03-13 DIAGNOSIS — H52203 Unspecified astigmatism, bilateral: Secondary | ICD-10-CM | POA: Diagnosis not present

## 2021-04-05 DIAGNOSIS — H52203 Unspecified astigmatism, bilateral: Secondary | ICD-10-CM | POA: Diagnosis not present

## 2021-04-27 DIAGNOSIS — L281 Prurigo nodularis: Secondary | ICD-10-CM | POA: Diagnosis not present

## 2021-05-10 DIAGNOSIS — Z03818 Encounter for observation for suspected exposure to other biological agents ruled out: Secondary | ICD-10-CM | POA: Diagnosis not present

## 2021-05-10 DIAGNOSIS — R509 Fever, unspecified: Secondary | ICD-10-CM | POA: Diagnosis not present

## 2021-05-10 DIAGNOSIS — R059 Cough, unspecified: Secondary | ICD-10-CM | POA: Diagnosis not present

## 2021-05-16 DIAGNOSIS — J029 Acute pharyngitis, unspecified: Secondary | ICD-10-CM | POA: Diagnosis not present

## 2021-05-16 DIAGNOSIS — J40 Bronchitis, not specified as acute or chronic: Secondary | ICD-10-CM | POA: Diagnosis not present

## 2021-05-16 DIAGNOSIS — R0602 Shortness of breath: Secondary | ICD-10-CM | POA: Diagnosis not present

## 2021-05-25 DIAGNOSIS — U071 COVID-19: Secondary | ICD-10-CM | POA: Diagnosis not present

## 2021-06-23 DIAGNOSIS — M1712 Unilateral primary osteoarthritis, left knee: Secondary | ICD-10-CM | POA: Diagnosis not present

## 2021-08-04 DIAGNOSIS — H52203 Unspecified astigmatism, bilateral: Secondary | ICD-10-CM | POA: Diagnosis not present

## 2021-09-25 DIAGNOSIS — L280 Lichen simplex chronicus: Secondary | ICD-10-CM | POA: Diagnosis not present

## 2021-09-25 DIAGNOSIS — L738 Other specified follicular disorders: Secondary | ICD-10-CM | POA: Diagnosis not present

## 2021-09-28 DIAGNOSIS — M353 Polymyalgia rheumatica: Secondary | ICD-10-CM | POA: Diagnosis not present

## 2021-09-28 DIAGNOSIS — M1990 Unspecified osteoarthritis, unspecified site: Secondary | ICD-10-CM | POA: Diagnosis not present

## 2021-09-28 DIAGNOSIS — Z7952 Long term (current) use of systemic steroids: Secondary | ICD-10-CM | POA: Diagnosis not present

## 2021-09-28 DIAGNOSIS — G43909 Migraine, unspecified, not intractable, without status migrainosus: Secondary | ICD-10-CM | POA: Diagnosis not present

## 2021-11-01 DIAGNOSIS — H52203 Unspecified astigmatism, bilateral: Secondary | ICD-10-CM | POA: Diagnosis not present

## 2021-12-07 DIAGNOSIS — H52203 Unspecified astigmatism, bilateral: Secondary | ICD-10-CM | POA: Diagnosis not present

## 2021-12-26 DIAGNOSIS — Z8541 Personal history of malignant neoplasm of cervix uteri: Secondary | ICD-10-CM | POA: Insufficient documentation

## 2021-12-26 DIAGNOSIS — E669 Obesity, unspecified: Secondary | ICD-10-CM | POA: Insufficient documentation

## 2021-12-26 DIAGNOSIS — M1712 Unilateral primary osteoarthritis, left knee: Secondary | ICD-10-CM | POA: Diagnosis not present

## 2022-01-17 DIAGNOSIS — I10 Essential (primary) hypertension: Secondary | ICD-10-CM | POA: Diagnosis not present

## 2022-01-17 DIAGNOSIS — G43909 Migraine, unspecified, not intractable, without status migrainosus: Secondary | ICD-10-CM | POA: Diagnosis not present

## 2022-01-17 DIAGNOSIS — L719 Rosacea, unspecified: Secondary | ICD-10-CM | POA: Diagnosis not present

## 2022-01-17 DIAGNOSIS — F411 Generalized anxiety disorder: Secondary | ICD-10-CM | POA: Diagnosis not present

## 2022-02-08 DIAGNOSIS — R5383 Other fatigue: Secondary | ICD-10-CM | POA: Diagnosis not present

## 2022-02-08 DIAGNOSIS — R051 Acute cough: Secondary | ICD-10-CM | POA: Diagnosis not present

## 2022-02-08 DIAGNOSIS — B349 Viral infection, unspecified: Secondary | ICD-10-CM | POA: Diagnosis not present

## 2022-02-08 DIAGNOSIS — R519 Headache, unspecified: Secondary | ICD-10-CM | POA: Diagnosis not present

## 2022-03-07 DIAGNOSIS — R197 Diarrhea, unspecified: Secondary | ICD-10-CM | POA: Diagnosis not present

## 2022-03-07 DIAGNOSIS — R109 Unspecified abdominal pain: Secondary | ICD-10-CM | POA: Diagnosis not present

## 2022-03-07 DIAGNOSIS — R110 Nausea: Secondary | ICD-10-CM | POA: Diagnosis not present

## 2022-03-24 DIAGNOSIS — H6991 Unspecified Eustachian tube disorder, right ear: Secondary | ICD-10-CM | POA: Diagnosis not present

## 2022-03-24 DIAGNOSIS — J329 Chronic sinusitis, unspecified: Secondary | ICD-10-CM | POA: Diagnosis not present

## 2022-03-24 DIAGNOSIS — J029 Acute pharyngitis, unspecified: Secondary | ICD-10-CM | POA: Diagnosis not present

## 2022-03-24 DIAGNOSIS — R0981 Nasal congestion: Secondary | ICD-10-CM | POA: Diagnosis not present

## 2022-04-02 DIAGNOSIS — M353 Polymyalgia rheumatica: Secondary | ICD-10-CM | POA: Diagnosis not present

## 2022-04-02 DIAGNOSIS — M1990 Unspecified osteoarthritis, unspecified site: Secondary | ICD-10-CM | POA: Diagnosis not present

## 2022-04-02 DIAGNOSIS — G43909 Migraine, unspecified, not intractable, without status migrainosus: Secondary | ICD-10-CM | POA: Diagnosis not present

## 2022-04-02 DIAGNOSIS — Z7952 Long term (current) use of systemic steroids: Secondary | ICD-10-CM | POA: Diagnosis not present

## 2022-05-09 DIAGNOSIS — F419 Anxiety disorder, unspecified: Secondary | ICD-10-CM | POA: Diagnosis not present

## 2022-05-09 DIAGNOSIS — R42 Dizziness and giddiness: Secondary | ICD-10-CM | POA: Diagnosis not present

## 2022-05-22 DIAGNOSIS — J069 Acute upper respiratory infection, unspecified: Secondary | ICD-10-CM | POA: Diagnosis not present

## 2022-05-30 DIAGNOSIS — F411 Generalized anxiety disorder: Secondary | ICD-10-CM | POA: Diagnosis not present

## 2022-05-30 DIAGNOSIS — J069 Acute upper respiratory infection, unspecified: Secondary | ICD-10-CM | POA: Diagnosis not present

## 2022-05-30 DIAGNOSIS — R42 Dizziness and giddiness: Secondary | ICD-10-CM | POA: Diagnosis not present

## 2022-05-30 DIAGNOSIS — I10 Essential (primary) hypertension: Secondary | ICD-10-CM | POA: Diagnosis not present

## 2022-06-11 DIAGNOSIS — H52203 Unspecified astigmatism, bilateral: Secondary | ICD-10-CM | POA: Diagnosis not present

## 2022-06-13 DIAGNOSIS — F419 Anxiety disorder, unspecified: Secondary | ICD-10-CM | POA: Diagnosis not present

## 2022-06-13 DIAGNOSIS — I10 Essential (primary) hypertension: Secondary | ICD-10-CM | POA: Diagnosis not present

## 2022-06-13 DIAGNOSIS — R519 Headache, unspecified: Secondary | ICD-10-CM | POA: Diagnosis not present

## 2022-06-13 DIAGNOSIS — R42 Dizziness and giddiness: Secondary | ICD-10-CM | POA: Diagnosis not present

## 2022-06-26 DIAGNOSIS — H524 Presbyopia: Secondary | ICD-10-CM | POA: Diagnosis not present

## 2022-07-06 DIAGNOSIS — J0140 Acute pansinusitis, unspecified: Secondary | ICD-10-CM | POA: Diagnosis not present

## 2022-08-06 DIAGNOSIS — J343 Hypertrophy of nasal turbinates: Secondary | ICD-10-CM | POA: Diagnosis not present

## 2022-08-06 DIAGNOSIS — J324 Chronic pansinusitis: Secondary | ICD-10-CM | POA: Diagnosis not present

## 2022-08-07 DIAGNOSIS — I10 Essential (primary) hypertension: Secondary | ICD-10-CM | POA: Diagnosis not present

## 2022-08-07 DIAGNOSIS — R739 Hyperglycemia, unspecified: Secondary | ICD-10-CM | POA: Diagnosis not present

## 2022-08-07 DIAGNOSIS — F411 Generalized anxiety disorder: Secondary | ICD-10-CM | POA: Diagnosis not present

## 2022-08-07 DIAGNOSIS — Z0000 Encounter for general adult medical examination without abnormal findings: Secondary | ICD-10-CM | POA: Diagnosis not present

## 2022-08-07 DIAGNOSIS — D649 Anemia, unspecified: Secondary | ICD-10-CM | POA: Diagnosis not present

## 2022-08-07 DIAGNOSIS — Z1331 Encounter for screening for depression: Secondary | ICD-10-CM | POA: Diagnosis not present

## 2022-08-07 DIAGNOSIS — M353 Polymyalgia rheumatica: Secondary | ICD-10-CM | POA: Diagnosis not present

## 2022-08-08 ENCOUNTER — Other Ambulatory Visit: Payer: Self-pay | Admitting: Family Medicine

## 2022-08-08 DIAGNOSIS — E2839 Other primary ovarian failure: Secondary | ICD-10-CM

## 2022-08-09 ENCOUNTER — Inpatient Hospital Stay: Admission: RE | Admit: 2022-08-09 | Payer: Medicare Other | Source: Ambulatory Visit

## 2022-08-10 DIAGNOSIS — M1712 Unilateral primary osteoarthritis, left knee: Secondary | ICD-10-CM | POA: Diagnosis not present

## 2022-08-10 DIAGNOSIS — M25562 Pain in left knee: Secondary | ICD-10-CM | POA: Diagnosis not present

## 2022-08-30 DIAGNOSIS — R002 Palpitations: Secondary | ICD-10-CM | POA: Diagnosis not present

## 2022-09-13 DIAGNOSIS — Z03818 Encounter for observation for suspected exposure to other biological agents ruled out: Secondary | ICD-10-CM | POA: Diagnosis not present

## 2022-09-13 DIAGNOSIS — R051 Acute cough: Secondary | ICD-10-CM | POA: Diagnosis not present

## 2022-10-04 DIAGNOSIS — K080 Exfoliation of teeth due to systemic causes: Secondary | ICD-10-CM | POA: Diagnosis not present

## 2022-10-19 DIAGNOSIS — M25561 Pain in right knee: Secondary | ICD-10-CM | POA: Diagnosis not present

## 2022-11-06 DIAGNOSIS — M1711 Unilateral primary osteoarthritis, right knee: Secondary | ICD-10-CM | POA: Diagnosis not present

## 2022-12-05 DIAGNOSIS — M170 Bilateral primary osteoarthritis of knee: Secondary | ICD-10-CM | POA: Diagnosis not present

## 2022-12-05 DIAGNOSIS — M1711 Unilateral primary osteoarthritis, right knee: Secondary | ICD-10-CM | POA: Diagnosis not present

## 2022-12-17 DIAGNOSIS — D509 Iron deficiency anemia, unspecified: Secondary | ICD-10-CM | POA: Diagnosis not present

## 2022-12-22 IMAGING — CT CT ABD-PELV W/ CM
2 of 5 series · 16 of 46 positions shown, 18 images · IV contrast (Omnipaque)
Comparison: None.

CLINICAL DATA: Abdominal pain and fever. Nausea, vomiting and
diarrhea for 7 days.

EXAM:
CT ABDOMEN AND PELVIS WITH CONTRAST
TECHNIQUE: Multidetector CT imaging of the abdomen and pelvis was performed
using the standard protocol following bolus administration of
intravenous contrast.
CONTRAST:  100mL OMNIPAQUE IOHEXOL 300 MG/ML  SOLN

[Series 2: axial st · axial · 0.98mm/px · z∈[-430,-14]mm · 13 of 93 slices shown, 15 images]
[im 5/93  soft-tissue]
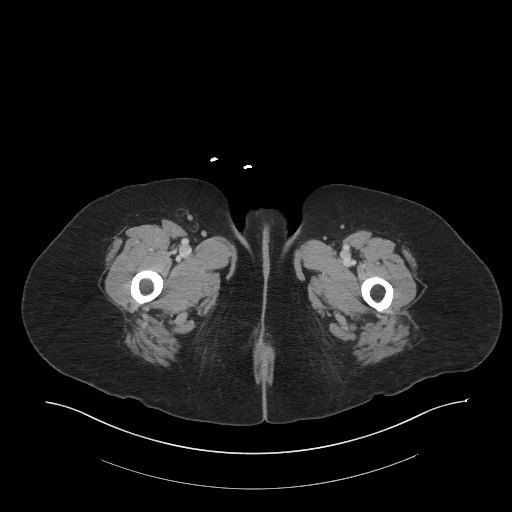
[im 5/93  bone]
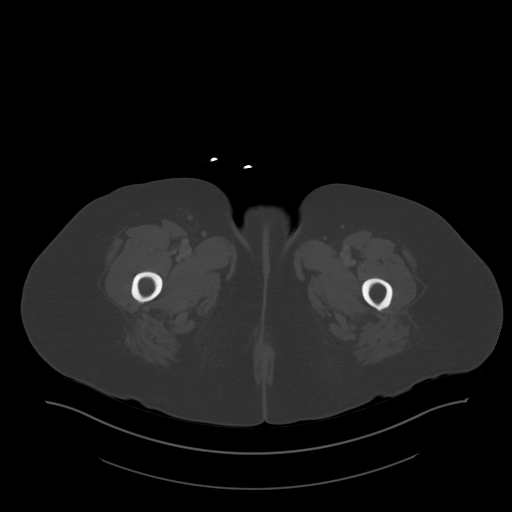
[im 14/93  soft-tissue]
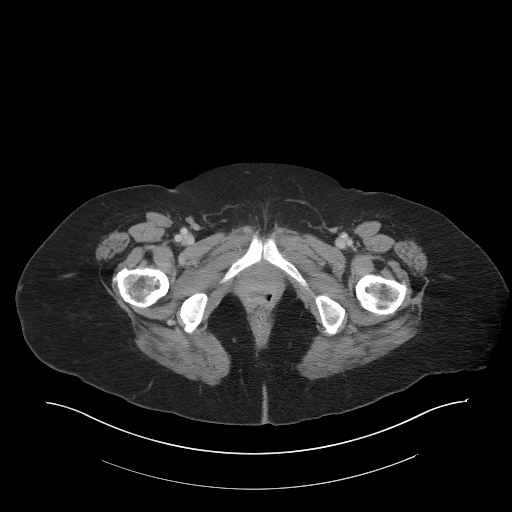
[im 19/93  soft-tissue]
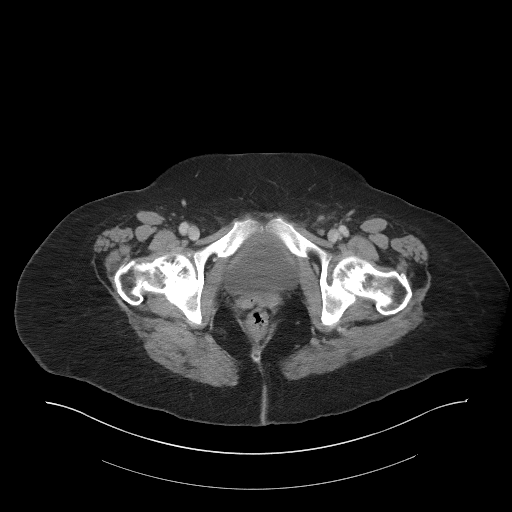
[im 28/93  soft-tissue]
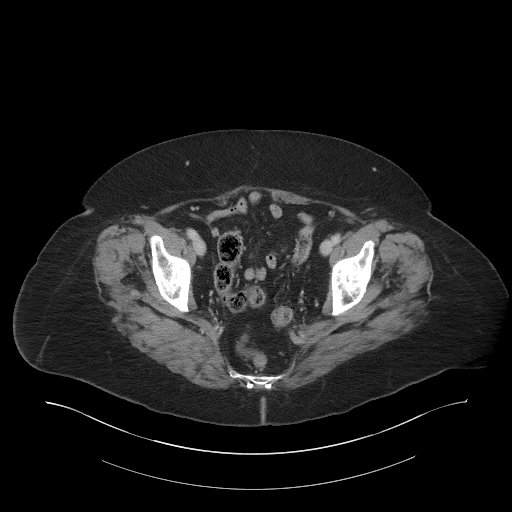
[im 33/93  soft-tissue]
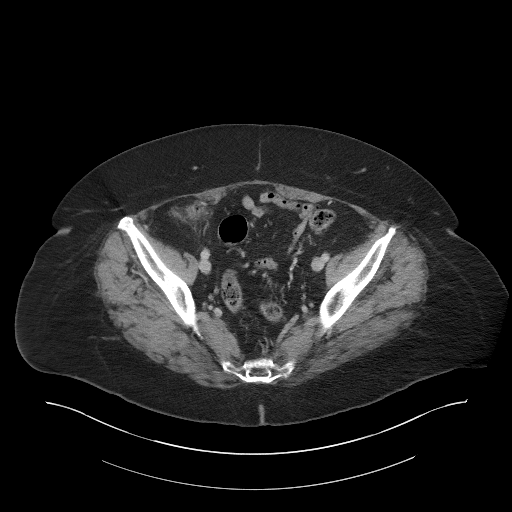
[im 42/93  soft-tissue]
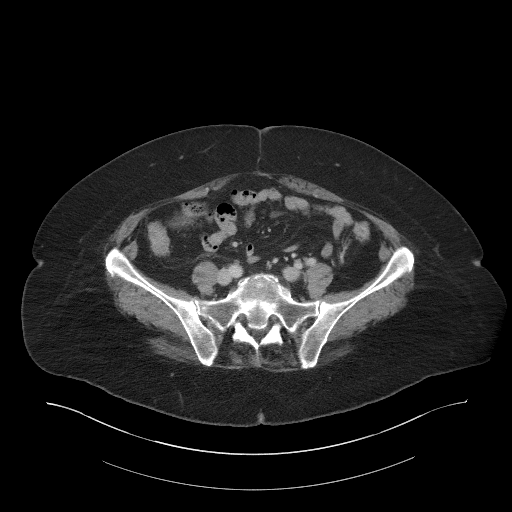
[im 47/93  soft-tissue]
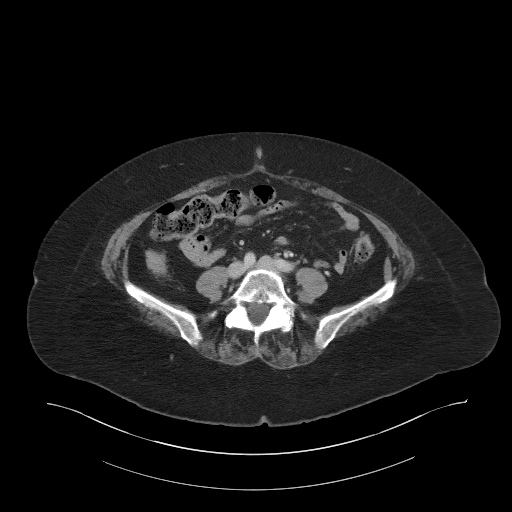
[im 51/93  soft-tissue]
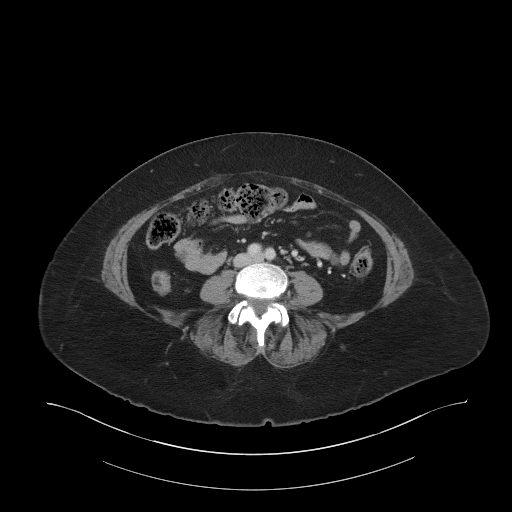
[im 60/93  soft-tissue]
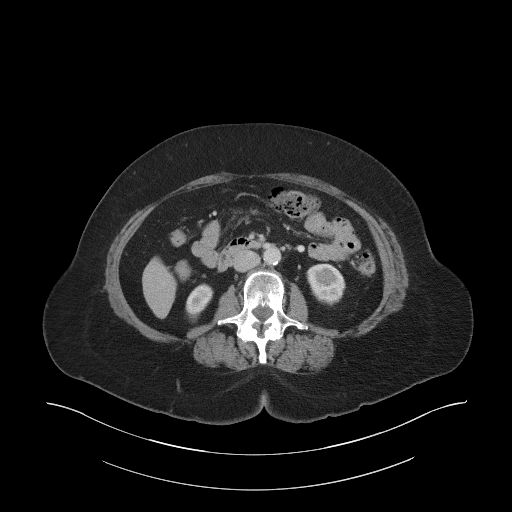
[im 60/93  bone]
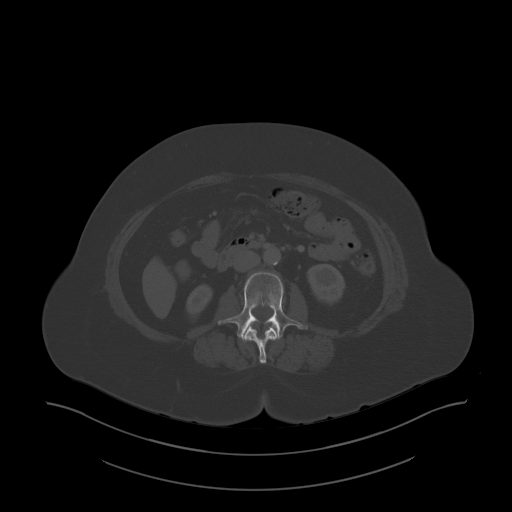
[im 65/93  soft-tissue]
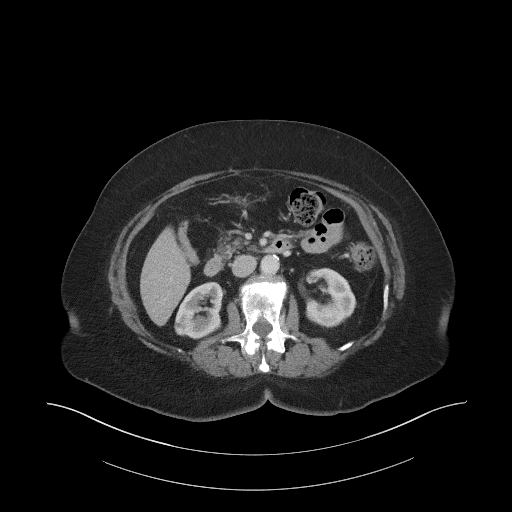
[im 74/93  soft-tissue]
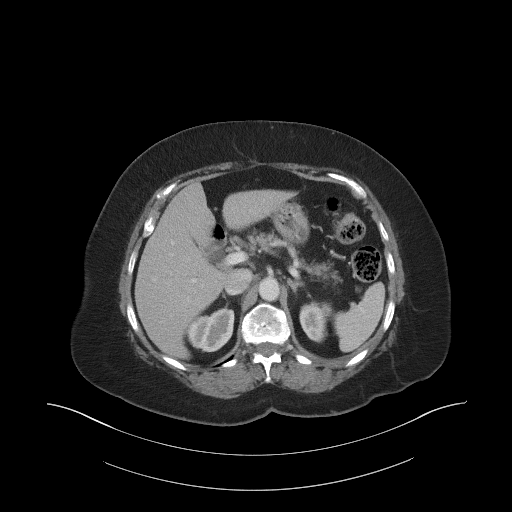
[im 79/93  soft-tissue]
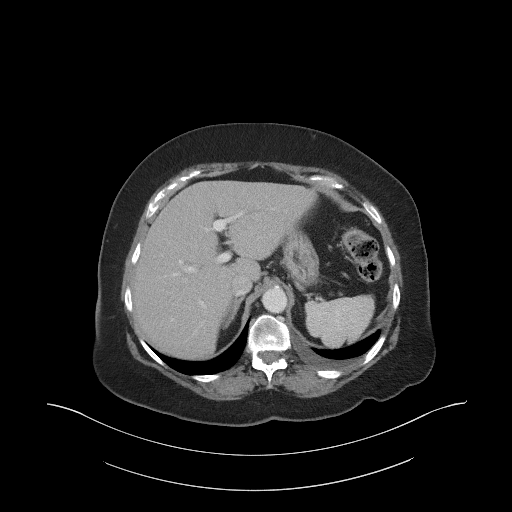
[im 88/93  soft-tissue]
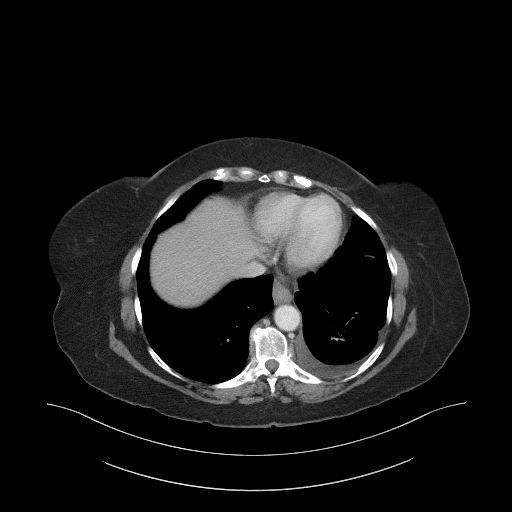

[Series 5: coronal st · coronal · 0.88mm/px · 3 of 102 slices shown]
[im 34/102  soft-tissue]
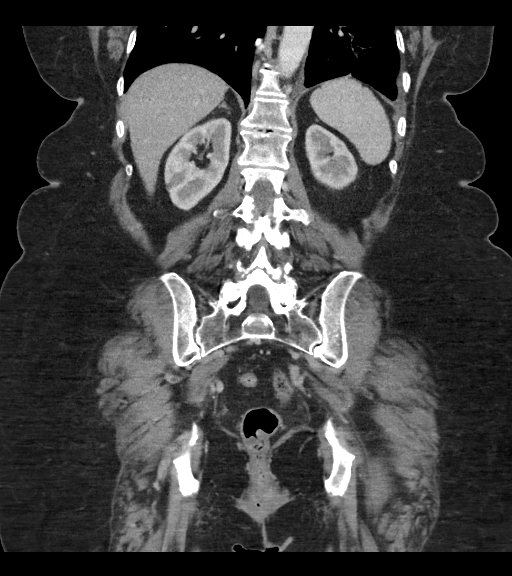
[im 45/102  soft-tissue]
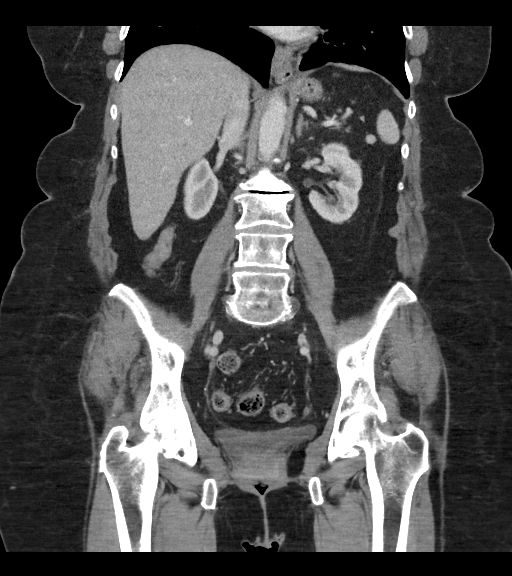
[im 57/102  soft-tissue]
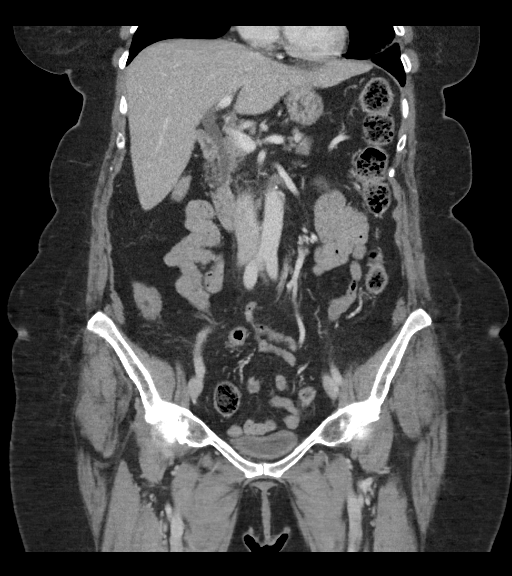

[16 of 46 positions shown; findings below may reference images not displayed]

FINDINGS: Lower chest: Small left pleural effusion. Linear opacities at the
left lung base consistent with atelectasis and/or scarring. No acute
findings.

Hepatobiliary: No focal liver abnormality is seen. Status post
cholecystectomy. No biliary dilatation.

Pancreas: Unremarkable. No pancreatic ductal dilatation or
surrounding inflammatory changes.

Spleen: Normal in size without focal abnormality.

Adrenals/Urinary Tract: No adrenal masses. Kidneys normal in size,
orientation and position. 4 mm low-attenuation lesion, lateral lower
pole cortex of the right kidney, likely a cyst. No other renal
masses, no stones and no hydronephrosis. Normal ureters. Normal
bladder.

Stomach/Bowel: Normal stomach. Small bowel and colon are normal in
caliber. No wall thickening. No inflammation. Prominent fat at the
ileocecal valve with twos small additional areas of fat within the
ascending colon wall consistent with lipomas.

Vascular/Lymphatic: Mild aortic atherosclerosis. No aneurysm. No
enlarged lymph nodes.

Reproductive: Status post hysterectomy. No adnexal masses.

Other: No abdominal wall hernia or abnormality. No abdominopelvic
ascites.

Musculoskeletal: No fracture or acute finding.  No bone lesion.
IMPRESSION: 1. No acute findings within the abdomen or pelvis. No bowel
inflammation.
2. Small left pleural effusion associated with linear lung base
opacities, the latter finding consistent with atelectasis and/or
scarring.
3. Mild aortic atherosclerosis.

## 2023-01-01 DIAGNOSIS — Z8572 Personal history of non-Hodgkin lymphomas: Secondary | ICD-10-CM | POA: Diagnosis not present

## 2023-01-01 DIAGNOSIS — M1990 Unspecified osteoarthritis, unspecified site: Secondary | ICD-10-CM | POA: Diagnosis not present

## 2023-01-01 DIAGNOSIS — G43909 Migraine, unspecified, not intractable, without status migrainosus: Secondary | ICD-10-CM | POA: Diagnosis not present

## 2023-01-01 DIAGNOSIS — M353 Polymyalgia rheumatica: Secondary | ICD-10-CM | POA: Diagnosis not present

## 2023-01-16 DIAGNOSIS — M170 Bilateral primary osteoarthritis of knee: Secondary | ICD-10-CM | POA: Diagnosis not present

## 2023-02-14 DIAGNOSIS — M1712 Unilateral primary osteoarthritis, left knee: Secondary | ICD-10-CM | POA: Diagnosis not present

## 2023-02-20 DIAGNOSIS — Z961 Presence of intraocular lens: Secondary | ICD-10-CM | POA: Diagnosis not present

## 2023-02-21 DIAGNOSIS — M1712 Unilateral primary osteoarthritis, left knee: Secondary | ICD-10-CM | POA: Diagnosis not present

## 2023-02-26 DIAGNOSIS — F411 Generalized anxiety disorder: Secondary | ICD-10-CM | POA: Diagnosis not present

## 2023-02-26 DIAGNOSIS — Z23 Encounter for immunization: Secondary | ICD-10-CM | POA: Diagnosis not present

## 2023-02-26 DIAGNOSIS — E441 Mild protein-calorie malnutrition: Secondary | ICD-10-CM | POA: Diagnosis not present

## 2023-02-26 DIAGNOSIS — M353 Polymyalgia rheumatica: Secondary | ICD-10-CM | POA: Diagnosis not present

## 2023-02-26 DIAGNOSIS — I10 Essential (primary) hypertension: Secondary | ICD-10-CM | POA: Diagnosis not present

## 2023-02-28 ENCOUNTER — Ambulatory Visit
Admission: RE | Admit: 2023-02-28 | Discharge: 2023-02-28 | Disposition: A | Discharge status: Home / Self Care | Payer: Medicare Other | Source: Ambulatory Visit | Attending: Family Medicine | Admitting: Family Medicine

## 2023-02-28 DIAGNOSIS — E2839 Other primary ovarian failure: Secondary | ICD-10-CM

## 2023-02-28 DIAGNOSIS — N958 Other specified menopausal and perimenopausal disorders: Secondary | ICD-10-CM | POA: Diagnosis not present

## 2023-02-28 DIAGNOSIS — Z853 Personal history of malignant neoplasm of breast: Secondary | ICD-10-CM | POA: Diagnosis not present

## 2023-02-28 DIAGNOSIS — R29890 Loss of height: Secondary | ICD-10-CM | POA: Diagnosis not present

## 2023-02-28 DIAGNOSIS — M1712 Unilateral primary osteoarthritis, left knee: Secondary | ICD-10-CM | POA: Diagnosis not present

## 2023-05-09 DIAGNOSIS — H8113 Benign paroxysmal vertigo, bilateral: Secondary | ICD-10-CM | POA: Diagnosis not present

## 2023-05-09 DIAGNOSIS — H6993 Unspecified Eustachian tube disorder, bilateral: Secondary | ICD-10-CM | POA: Diagnosis not present

## 2023-05-23 DIAGNOSIS — H6993 Unspecified Eustachian tube disorder, bilateral: Secondary | ICD-10-CM | POA: Diagnosis not present

## 2023-05-23 DIAGNOSIS — K5900 Constipation, unspecified: Secondary | ICD-10-CM | POA: Diagnosis not present

## 2023-06-28 DIAGNOSIS — B349 Viral infection, unspecified: Secondary | ICD-10-CM | POA: Diagnosis not present

## 2023-08-06 DIAGNOSIS — L718 Other rosacea: Secondary | ICD-10-CM | POA: Diagnosis not present

## 2023-08-16 ENCOUNTER — Telehealth (INDEPENDENT_AMBULATORY_CARE_PROVIDER_SITE_OTHER): Payer: Self-pay | Admitting: Otolaryngology

## 2023-08-16 NOTE — Telephone Encounter (Signed)
 Flonase nasal spray was called in at  [facility] on [address] with one refill. Patient need a f/u appointment before next refill.

## 2023-08-23 DIAGNOSIS — F419 Anxiety disorder, unspecified: Secondary | ICD-10-CM | POA: Diagnosis not present

## 2023-08-23 DIAGNOSIS — G43909 Migraine, unspecified, not intractable, without status migrainosus: Secondary | ICD-10-CM | POA: Diagnosis not present

## 2023-08-23 DIAGNOSIS — I10 Essential (primary) hypertension: Secondary | ICD-10-CM | POA: Diagnosis not present

## 2023-08-23 DIAGNOSIS — M353 Polymyalgia rheumatica: Secondary | ICD-10-CM | POA: Diagnosis not present

## 2023-09-01 DIAGNOSIS — J309 Allergic rhinitis, unspecified: Secondary | ICD-10-CM | POA: Diagnosis not present

## 2023-09-01 DIAGNOSIS — Z03818 Encounter for observation for suspected exposure to other biological agents ruled out: Secondary | ICD-10-CM | POA: Diagnosis not present

## 2023-09-01 DIAGNOSIS — R519 Headache, unspecified: Secondary | ICD-10-CM | POA: Diagnosis not present

## 2023-09-01 DIAGNOSIS — R067 Sneezing: Secondary | ICD-10-CM | POA: Diagnosis not present

## 2023-09-03 ENCOUNTER — Inpatient Hospital Stay (HOSPITAL_COMMUNITY)
Admission: EM | Admit: 2023-09-03 | Discharge: 2023-09-07 | DRG: 101 | Disposition: A | Discharge status: Home Health | Attending: Family Medicine | Admitting: Family Medicine

## 2023-09-03 ENCOUNTER — Emergency Department (HOSPITAL_COMMUNITY)

## 2023-09-03 ENCOUNTER — Other Ambulatory Visit: Payer: Self-pay

## 2023-09-03 ENCOUNTER — Encounter (HOSPITAL_COMMUNITY): Payer: Self-pay

## 2023-09-03 DIAGNOSIS — G8929 Other chronic pain: Secondary | ICD-10-CM | POA: Diagnosis present

## 2023-09-03 DIAGNOSIS — Z881 Allergy status to other antibiotic agents status: Secondary | ICD-10-CM

## 2023-09-03 DIAGNOSIS — Z8572 Personal history of non-Hodgkin lymphomas: Secondary | ICD-10-CM | POA: Diagnosis not present

## 2023-09-03 DIAGNOSIS — Z79899 Other long term (current) drug therapy: Secondary | ICD-10-CM | POA: Diagnosis not present

## 2023-09-03 DIAGNOSIS — I663 Occlusion and stenosis of cerebellar arteries: Secondary | ICD-10-CM | POA: Diagnosis not present

## 2023-09-03 DIAGNOSIS — R569 Unspecified convulsions: Secondary | ICD-10-CM

## 2023-09-03 DIAGNOSIS — I708 Atherosclerosis of other arteries: Secondary | ICD-10-CM | POA: Diagnosis not present

## 2023-09-03 DIAGNOSIS — C801 Malignant (primary) neoplasm, unspecified: Secondary | ICD-10-CM | POA: Diagnosis not present

## 2023-09-03 DIAGNOSIS — Z79810 Long term (current) use of selective estrogen receptor modulators (SERMs): Secondary | ICD-10-CM

## 2023-09-03 DIAGNOSIS — Z885 Allergy status to narcotic agent status: Secondary | ICD-10-CM

## 2023-09-03 DIAGNOSIS — G43909 Migraine, unspecified, not intractable, without status migrainosus: Secondary | ICD-10-CM | POA: Diagnosis not present

## 2023-09-03 DIAGNOSIS — M353 Polymyalgia rheumatica: Secondary | ICD-10-CM

## 2023-09-03 DIAGNOSIS — M1712 Unilateral primary osteoarthritis, left knee: Secondary | ICD-10-CM | POA: Diagnosis not present

## 2023-09-03 DIAGNOSIS — E785 Hyperlipidemia, unspecified: Secondary | ICD-10-CM | POA: Diagnosis present

## 2023-09-03 DIAGNOSIS — R4701 Aphasia: Principal | ICD-10-CM

## 2023-09-03 DIAGNOSIS — R270 Ataxia, unspecified: Secondary | ICD-10-CM | POA: Diagnosis not present

## 2023-09-03 DIAGNOSIS — Z8673 Personal history of transient ischemic attack (TIA), and cerebral infarction without residual deficits: Secondary | ICD-10-CM | POA: Diagnosis not present

## 2023-09-03 DIAGNOSIS — R29818 Other symptoms and signs involving the nervous system: Secondary | ICD-10-CM | POA: Diagnosis not present

## 2023-09-03 DIAGNOSIS — R29810 Facial weakness: Secondary | ICD-10-CM | POA: Diagnosis not present

## 2023-09-03 DIAGNOSIS — Z8542 Personal history of malignant neoplasm of other parts of uterus: Secondary | ICD-10-CM

## 2023-09-03 DIAGNOSIS — G40909 Epilepsy, unspecified, not intractable, without status epilepticus: Secondary | ICD-10-CM | POA: Diagnosis not present

## 2023-09-03 DIAGNOSIS — D0511 Intraductal carcinoma in situ of right breast: Secondary | ICD-10-CM

## 2023-09-03 DIAGNOSIS — D0512 Intraductal carcinoma in situ of left breast: Secondary | ICD-10-CM | POA: Diagnosis not present

## 2023-09-03 DIAGNOSIS — Z888 Allergy status to other drugs, medicaments and biological substances status: Secondary | ICD-10-CM

## 2023-09-03 DIAGNOSIS — R410 Disorientation, unspecified: Secondary | ICD-10-CM

## 2023-09-03 DIAGNOSIS — I672 Cerebral atherosclerosis: Secondary | ICD-10-CM | POA: Diagnosis not present

## 2023-09-03 DIAGNOSIS — E860 Dehydration: Secondary | ICD-10-CM | POA: Diagnosis not present

## 2023-09-03 DIAGNOSIS — I10 Essential (primary) hypertension: Secondary | ICD-10-CM

## 2023-09-03 DIAGNOSIS — G4489 Other headache syndrome: Secondary | ICD-10-CM | POA: Diagnosis not present

## 2023-09-03 DIAGNOSIS — G319 Degenerative disease of nervous system, unspecified: Secondary | ICD-10-CM | POA: Diagnosis not present

## 2023-09-03 DIAGNOSIS — R2990 Unspecified symptoms and signs involving the nervous system: Secondary | ICD-10-CM

## 2023-09-03 DIAGNOSIS — R519 Headache, unspecified: Secondary | ICD-10-CM | POA: Diagnosis not present

## 2023-09-03 DIAGNOSIS — G459 Transient cerebral ischemic attack, unspecified: Secondary | ICD-10-CM | POA: Diagnosis not present

## 2023-09-03 DIAGNOSIS — R0989 Other specified symptoms and signs involving the circulatory and respiratory systems: Secondary | ICD-10-CM | POA: Diagnosis not present

## 2023-09-03 DIAGNOSIS — R4182 Altered mental status, unspecified: Secondary | ICD-10-CM | POA: Diagnosis not present

## 2023-09-03 DIAGNOSIS — I6782 Cerebral ischemia: Secondary | ICD-10-CM | POA: Diagnosis not present

## 2023-09-03 DIAGNOSIS — Z789 Other specified health status: Secondary | ICD-10-CM

## 2023-09-03 LAB — CBG MONITORING, ED: Glucose-Capillary: 140 mg/dL — ABNORMAL HIGH (ref 70–99)

## 2023-09-03 LAB — URINALYSIS, ROUTINE W REFLEX MICROSCOPIC
Bilirubin Urine: NEGATIVE
Glucose, UA: NEGATIVE mg/dL
Hgb urine dipstick: NEGATIVE
Ketones, ur: 20 mg/dL — AB
Leukocytes,Ua: NEGATIVE
Nitrite: NEGATIVE
Protein, ur: NEGATIVE mg/dL
Specific Gravity, Urine: >1.046 — ABNORMAL HIGH (ref 1.005–1.030)
pH: 7 (ref 5.0–8.0)

## 2023-09-03 LAB — CBC
HCT: 44.2 % (ref 36.0–46.0)
Hemoglobin: 15.1 g/dL — ABNORMAL HIGH (ref 12.0–15.0)
MCH: 29.4 pg (ref 26.0–34.0)
MCHC: 34.2 g/dL (ref 30.0–36.0)
MCV: 86 fL (ref 80.0–100.0)
Platelets: 299 10*3/uL (ref 150–400)
RBC: 5.14 MIL/uL — ABNORMAL HIGH (ref 3.87–5.11)
RDW: 12.9 % (ref 11.5–15.5)
WBC: 9.2 10*3/uL (ref 4.0–10.5)
nRBC: 0 % (ref 0.0–0.2)

## 2023-09-03 LAB — ETHANOL: Alcohol, Ethyl (B): <10 mg/dL (ref <10)

## 2023-09-03 LAB — COMPREHENSIVE METABOLIC PANEL WITH GFR
ALT: 27 U/L (ref 0–44)
AST: 37 U/L (ref 15–41)
Albumin: 3.8 g/dL (ref 3.5–5.0)
Alkaline Phosphatase: 71 U/L (ref 38–126)
Anion gap: 12 (ref 5–15)
BUN: 11 mg/dL (ref 8–23)
CO2: 22 mmol/L (ref 22–32)
Calcium: 9.6 mg/dL (ref 8.9–10.3)
Chloride: 100 mmol/L (ref 98–111)
Creatinine, Ser: 0.77 mg/dL (ref 0.44–1.00)
GFR, Estimated: >60 mL/min (ref >60)
Glucose, Bld: 152 mg/dL — ABNORMAL HIGH (ref 70–99)
Potassium: 3.8 mmol/L (ref 3.5–5.1)
Sodium: 134 mmol/L — ABNORMAL LOW (ref 135–145)
Total Bilirubin: 0.6 mg/dL (ref 0.0–1.2)
Total Protein: 7.7 g/dL (ref 6.5–8.1)

## 2023-09-03 LAB — I-STAT CHEM 8, ED
BUN: 13 mg/dL (ref 8–23)
Calcium, Ion: 1.12 mmol/L — ABNORMAL LOW (ref 1.15–1.40)
Chloride: 100 mmol/L (ref 98–111)
Creatinine, Ser: 0.8 mg/dL (ref 0.44–1.00)
Glucose, Bld: 153 mg/dL — ABNORMAL HIGH (ref 70–99)
HCT: 45 % (ref 36.0–46.0)
Hemoglobin: 15.3 g/dL — ABNORMAL HIGH (ref 12.0–15.0)
Potassium: 3.9 mmol/L (ref 3.5–5.1)
Sodium: 135 mmol/L (ref 135–145)
TCO2: 24 mmol/L (ref 22–32)

## 2023-09-03 LAB — SEDIMENTATION RATE: Sed Rate: 9 mm/h (ref 0–22)

## 2023-09-03 LAB — DIFFERENTIAL
Abs Immature Granulocytes: 0.05 10*3/uL (ref 0.00–0.07)
Basophils Absolute: 0.1 10*3/uL (ref 0.0–0.1)
Basophils Relative: 1 %
Eosinophils Absolute: 0 10*3/uL (ref 0.0–0.5)
Eosinophils Relative: 0 %
Immature Granulocytes: 1 %
Lymphocytes Relative: 14 %
Lymphs Abs: 1.3 10*3/uL (ref 0.7–4.0)
Monocytes Absolute: 0.5 10*3/uL (ref 0.1–1.0)
Monocytes Relative: 6 %
Neutro Abs: 7.2 10*3/uL (ref 1.7–7.7)
Neutrophils Relative %: 78 %

## 2023-09-03 LAB — PROTIME-INR
INR: 1 (ref 0.8–1.2)
Prothrombin Time: 13.2 s (ref 11.4–15.2)

## 2023-09-03 LAB — APTT: aPTT: 31 s (ref 24–36)

## 2023-09-03 LAB — C-REACTIVE PROTEIN: CRP: 0.5 mg/dL (ref <1.0)

## 2023-09-03 MED ORDER — VITAMIN D 25 MCG (1000 UNIT) PO TABS
2000.0000 [IU] | ORAL_TABLET | Freq: Every day | ORAL | Status: DC
  Administered 2023-09-03 – 2023-09-07 (×5): 2000 [IU] via ORAL
  Filled 2023-09-03 (×5): qty 2

## 2023-09-03 MED ORDER — LEVETIRACETAM 500 MG PO TABS
500.0000 mg | ORAL_TABLET | Freq: Two times a day (BID) | ORAL | Status: DC
  Administered 2023-09-03 – 2023-09-04 (×2): 500 mg
  Filled 2023-09-03 (×3): qty 1

## 2023-09-03 MED ORDER — ACETAMINOPHEN 500 MG PO TABS
1000.0000 mg | ORAL_TABLET | Freq: Once | ORAL | Status: AC
  Administered 2023-09-03: 1000 mg via ORAL
  Filled 2023-09-03: qty 2

## 2023-09-03 MED ORDER — SODIUM CHLORIDE 0.9% FLUSH: 3.0000 mL | Freq: Once | INTRAVENOUS | Status: DC

## 2023-09-03 MED ORDER — HYDROCHLOROTHIAZIDE 12.5 MG PO TABS
6.2500 mg | ORAL_TABLET | Freq: Every day | ORAL | Status: DC
  Administered 2023-09-03: 6.25 mg via ORAL
  Filled 2023-09-03: qty 1

## 2023-09-03 MED ORDER — ONDANSETRON HCL 4 MG/2ML IJ SOLN
4.0000 mg | Freq: Once | INTRAMUSCULAR | Status: AC
  Administered 2023-09-03: 4 mg via INTRAVENOUS
  Filled 2023-09-03: qty 2

## 2023-09-03 MED ORDER — ENOXAPARIN SODIUM 40 MG/0.4ML IJ SOSY: 40.0000 mg | PREFILLED_SYRINGE | INTRAMUSCULAR | Status: DC

## 2023-09-03 MED ORDER — TAMOXIFEN CITRATE 10 MG PO TABS
20.0000 mg | ORAL_TABLET | Freq: Every day | ORAL | Status: DC
  Administered 2023-09-03 – 2023-09-06 (×4): 20 mg via ORAL
  Filled 2023-09-03 (×5): qty 2

## 2023-09-03 MED ORDER — LEVETIRACETAM IN NACL 500 MG/100ML IV SOLN
500.0000 mg | Freq: Once | INTRAVENOUS | Status: AC
  Administered 2023-09-03: 500 mg via INTRAVENOUS
  Filled 2023-09-03: qty 100

## 2023-09-03 MED ORDER — LISINOPRIL-HYDROCHLOROTHIAZIDE 10-12.5 MG PO TABS: 0.5000 | ORAL_TABLET | Freq: Every day | ORAL | Status: DC

## 2023-09-03 MED ORDER — LACTATED RINGERS IV BOLUS
1000.0000 mL | Freq: Once | INTRAVENOUS | Status: AC
  Administered 2023-09-03: 1000 mL via INTRAVENOUS

## 2023-09-03 MED ORDER — LEVETIRACETAM IN NACL 500 MG/100ML IV SOLN
500.0000 mg | Freq: Two times a day (BID) | INTRAVENOUS | Status: DC
  Filled 2023-09-03 (×2): qty 100

## 2023-09-03 MED ORDER — PANTOPRAZOLE SODIUM 20 MG PO TBEC
20.0000 mg | DELAYED_RELEASE_TABLET | Freq: Every day | ORAL | Status: DC
  Administered 2023-09-04 – 2023-09-07 (×4): 20 mg via ORAL
  Filled 2023-09-03 (×4): qty 1

## 2023-09-03 MED ORDER — TAMOXIFEN CITRATE 20 MG PO TABS: 20.0000 mg | ORAL_TABLET | Freq: Every day | ORAL | Status: DC

## 2023-09-03 MED ORDER — IOHEXOL 350 MG/ML SOLN
100.0000 mL | Freq: Once | INTRAVENOUS | Status: AC | PRN
  Administered 2023-09-03: 100 mL via INTRAVENOUS

## 2023-09-03 MED ORDER — LISINOPRIL 2.5 MG PO TABS
5.0000 mg | ORAL_TABLET | Freq: Every day | ORAL | Status: DC
  Administered 2023-09-03 – 2023-09-07 (×5): 5 mg via ORAL
  Filled 2023-09-03: qty 2
  Filled 2023-09-03: qty 1
  Filled 2023-09-03 (×3): qty 2

## 2023-09-03 MED ORDER — ONDANSETRON HCL 4 MG PO TABS: 4.0000 mg | ORAL_TABLET | Freq: Three times a day (TID) | ORAL | Status: DC | PRN

## 2023-09-03 MED ORDER — ACETAMINOPHEN 500 MG PO TABS
1000.0000 mg | ORAL_TABLET | Freq: Four times a day (QID) | ORAL | Status: DC
  Administered 2023-09-03 – 2023-09-07 (×12): 1000 mg via ORAL
  Filled 2023-09-03 (×13): qty 2

## 2023-09-03 MED ORDER — PREDNISONE 5 MG PO TABS
5.0000 mg | ORAL_TABLET | Freq: Every day | ORAL | Status: DC
  Administered 2023-09-04 – 2023-09-06 (×3): 5 mg via ORAL
  Filled 2023-09-03 (×3): qty 1

## 2023-09-03 MED ORDER — NORTRIPTYLINE HCL 25 MG PO CAPS
25.0000 mg | ORAL_CAPSULE | Freq: Every day | ORAL | Status: DC
  Administered 2023-09-04 – 2023-09-06 (×4): 25 mg via ORAL
  Filled 2023-09-03 (×6): qty 1

## 2023-09-03 NOTE — Assessment & Plan Note (Addendum)
 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  Last known well 04/14 at 1400 according to EDP.  Patient has expressive aphasia consistent with Wernicke's aphasia.  Also with mild receptive aphasia.  Minimal motor involvement.  CT head, CT head and neck, and CT perfusion were negative.  MRI of brain without contrast showed possible punctate acute infarct in the left periatrial white matter.  Could be choroid plexus.  Per Dr. Cleone Dad with neurology, preliminary LTM EEG read shows focal left slowing; she will be started on Keppra.  Final report pending.  While less likely, she does have a history of migraines, so question sequelae of this. - Admit to [facility], Dr. [place] Lax admitting - Start IV Keppra 500 mg twice daily, per neuro recs, appreciate neuro assistance - Continue LTM EEG, awaiting final read - NPO until speech stroke eval - Seizure precautions - PT/OT/SLP - Reversible causes of AMS labs as follows: TSH, vitamin B1, folate, vitamin B12, RPR, HIV - Complete echocardiogram - Will schedule Tylenol 1000 mg every 6 hours for now given difficulty asking for pain medications for headache

## 2023-09-03 NOTE — Consult Note (Addendum)
 NEUROLOGY CONSULT NOTE   Date of service: September 03, 2023 Patient Name: Melissa Sosa MRN:  [id number] DOB:  Dec 20, 1945 Chief Complaint: "Code Stroke" Requesting Provider: Clay Cummins, MD  History of Present Illness  Melissa Sosa is a 78 y.o. female with hx HTN, PMR, migraine, depression, of presenting with aphasia.  Per her son Melissa Sosa) she felt like she had a sinus infection or headache on Wednesday and Thursday last week. The Friday she went to the walk in clinic and they diagnosed her with a viral sinus infection. Flu and covid testing were negative at the time. On Sunday her son visited her and she felt tired and like her sinuses were tight. Monday night she faced timed with her son and seemed to be in her normal state of health. This morning she went to her neighbors house and appeared confused, reporting left temporal headache with aphasia and ataxia at 0830am. Her neighbors called EMS. She arrived to the ED around 0950am.  She is having expressive aphasia.  She is following commands.  She does endorse a headache.  She is able to tell me her name and that she is at  Hospital.  In discussion with son he states that she is typically alert and oriented x 4 and completes all of her ADLs independently.  She does have a history of PMR and takes her steroids as needed.  He tells us that she does this because they steroids give her insomnia.  She recently had her blood pressure medication cut in half and then it was discontinued earlier this week.  LKW: 4/14  Modified rankin score: 0-Completely asymptomatic and back to baseline post- stroke IV Thrombolysis:  EVT: No, no LVO  NIHSS components Score: Comment  1a Level of Conscious 0[x] 1[] 2[] 3[]     1b LOC Questions 0[] 1[] 2[x]      1c LOC Commands 0[] 1[x] 2[]      2 Best Gaze 0[x] 1[] 2[]      3 Visual 0[x] 1[] 2[] 3[]     4 Facial Palsy 0[x] 1[] 2[] 3[]     5a Motor Arm - left 0[x] 1[] 2[] 3[] 4[] UN[]   5b Motor Arm - Right  0[x] 1[] 2[] 3[] 4[] UN[]   6a Motor Leg - Left 0[x] 1[] 2[] 3[] 4[] UN[]   6b Motor Leg - Right 0[x] 1[] 2[] 3[] 4[] UN[]   7 Limb Ataxia 0[] 1[x] 2[] 3[] UN[]    8 Sensory 0[x] 1[] 2[] UN[]     9 Best Language 0[x] 1[] 2[] 3[]     10 Dysarthria 0[x] 1[] 2[] UN[]     11  Extinct. and Inattention 0[x]  1[]  2[]       TOTAL:4       ROS   Unable to ascertain due to Aphasia   Past History   Past Medical History:  Diagnosis Date   Anxiety    Breast cancer (HCC)    Hypertension    PMR (polymyalgia rheumatica) (HCC)    Pneumonia     History reviewed. No pertinent surgical history.  Family History: History reviewed. No pertinent family history.  Social History  reports that she has never smoked. She has never used smokeless tobacco. She reports that she does not drink alcohol and does not use drugs.  Allergies  Allergen Reactions   Imitrex [Sumatriptan]     hypertension   Morphine And Codeine Other (See Comments)    Hyper, confusion  Medications   Current Facility-Administered Medications:    sodium chloride flush (NS) 0.9 % injection 3 mL, 3 mL, Intravenous, Once, Soto, Johana, PA-C  Current Outpatient Medications:    Cholecalciferol (VITAMIN D3) 2000 units capsule, Take 2,000 Units by mouth daily., Disp: , Rfl:    Cyanocobalamin (VITAMIN B 12) 100 MCG LOZG, Take 100 mcg by mouth daily., Disp: , Rfl:    cyclobenzaprine (FLEXERIL) 10 MG tablet, Take 1 tablet (10 mg total) by mouth 2 (two) times daily as needed for muscle spasms., Disp: 20 tablet, Rfl: 0   Ginger 500 MG CAPS, Take 500 mg by mouth daily., Disp: , Rfl:    lisinopril-hydrochlorothiazide (PRINZIDE,ZESTORETIC) 10-12.5 MG tablet, Take 0.5 tablets by mouth daily., Disp: , Rfl:    nortriptyline (PAMELOR) 25 MG capsule, Take 25 mg by mouth at bedtime., Disp: , Rfl:    Omega-3 Fatty Acids (FISH OIL) 500 MG CAPS, Take 1,000 mg by mouth daily., Disp: , Rfl:    ondansetron (ZOFRAN) 4 MG tablet, Take 1 tablet (4 mg  total) by mouth every 8 (eight) hours as needed for nausea or vomiting., Disp: 20 tablet, Rfl: 0   pantoprazole (PROTONIX) 20 MG tablet, Take 1 tablet (20 mg total) by mouth daily., Disp: 30 tablet, Rfl: 0   predniSONE (DELTASONE) 10 MG tablet, Take 5 mg by mouth daily., Disp: , Rfl:    tamoxifen (NOLVADEX) 20 MG tablet, Take 20 mg by mouth at bedtime., Disp: , Rfl:    Turmeric 500 MG CAPS, Take 500 mg by mouth daily., Disp: , Rfl:   Vitals   Vitals:   09/03/23 0957 09/03/23 1000  BP:  (!) 143/69  Pulse:  99  Resp:  14  Temp:  98 F (36.7 C)  TempSrc:  Oral  SpO2: 97% 100%    There is no height or weight on file to calculate BMI.  Physical Exam   Constitutional: Appears well-developed and well-nourished.  Psych: Affect appropriate to situation.  Eyes: No scleral injection.  HENT: No OP obstruction.  Head: Normocephalic.  Cardiovascular: Normal rate and regular rhythm.  Respiratory: Effort normal, non-labored breathing.  GI: Soft.  No distension. There is no tenderness.  Skin: WDI.   Neurologic Examination   Neuro: Mental Status: Patient is awake, alert Expressive aphasia that fluctuates, at times able to complete almost a full sentence and at other times her speech is entirely word salad States sons name "Melissa Sosa" but identifies that she isn't saying it right Able to state name  Cranial Nerves: II: Visual Fields are full. Pupils are equal, round, and reactive to light.   III,IV, VI: EOMI without ptosis or diploplia.  V: Facial sensation is symmetric to temperature VII: Facial movement is symmetric resting and smiling.  Intermittent subtle right facial droop VIII: Hearing is intact to voice Motor: No drift in all 4 extremities Sensory: Sensation is symmetric to light touch and temperature in the arms and legs. No extinction to DSS present.  Cerebellar: Moderate ataxia on NP eval, could not understand command on MD eval  Labs/Imaging/Neurodiagnostic studies   CBC:   Recent Labs  Lab 09/03/23 1042 09/03/23 1048  WBC 9.2  --   NEUTROABS 7.2  --   HGB 15.1* 15.3*  HCT 44.2 45.0  MCV 86.0  --   PLT 299  --    Basic Metabolic Panel:  Lab Results  Component Value Date   NA 135 09/03/2023   K 3.9 09/03/2023   CO2 29 11/07/2020  GLUCOSE 153 (H) 09/03/2023   BUN 13 09/03/2023   CREATININE 0.80 09/03/2023   CALCIUM 10.0 11/07/2020   GFRNONAA >60 11/07/2020   GFRAA >60 10/31/2019   Lipid Panel: No results found for: "LDLCALC" HgbA1c: No results found for: "HGBA1C" Urine Drug Screen: No results found for: "LABOPIA", "COCAINSCRNUR", "LABBENZ", "AMPHETMU", "THCU", "LABBARB"  Alcohol Level     Component Value Date/Time   ETH <10 09/03/2023 1042   INR  Lab Results  Component Value Date   INR 1.0 09/03/2023   APTT  Lab Results  Component Value Date   APTT 31 09/03/2023   AED levels: No results found for: "PHENYTOIN", "ZONISAMIDE", "LAMOTRIGINE", "LEVETIRACETA"  CT Head without contrast(Personally reviewed): No evidence of an acute intracranial abnormality. Mild cerebral atrophy and cerebral white matter chronic small vessel ischemic disease.  CT angio Head and Neck with contrast(Personally reviewed): CTA neck:  1. The common carotid and internal carotid arteries are patent within the neck without stenosis. Mild atherosclerotic plaque bilaterally, as described. 2. The vertebral arteries are patent within the neck without stenosis or significant atherosclerotic disease. 3. Aortic Atherosclerosis (ICD10-I70.0).   CTA head: 1. No proximal intracranial large vessel occlusion identified. 2. Intracranial atherosclerotic disease as described. 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   CT perfusion head: The perfusion software identifies no core infarct. The perfusion software identifies no critically hypoperfused parenchyma (utilizing Tmax>6 seconds threshold). No mismatch volume  reported.   MRI Brain(Personally reviewed): No diffusion restriction noted on initial review. Awaiting formal radiology read   ASSESSMENT   Melissa Sosa is a 78 y.o. female presenting with left temporal headache and aphasia. CT Head and CTA negative for emergent occlusion or infarct. MRI negative for diffusion restriction on initial review. Will obtain a stat EEG to assess for possible focal seizures as well as ESR and CRP. Continue evaluation for seizure and temporal arteritis (strongly associated with polymyalgia rheumatica) as well.   On exam she continues to have some confusion with fluctuating expressive aphasia.  For example she states her son's name as "Melissa Sosa" but is able to explain that she is saying it incorrectly.  She is able to correctly give her name and location.  Given no stroke on imaging, favor focal seizure or potentially encephalopathy as etiology of her symptoms, however diagnosis is unclear at this time  RECOMMENDATIONS   Emergent recommendations: - Code stroke activation for potential treatment by wake-up protocol - CTA head and neck with perfusion given potentially outside of 6-hour window - Emergent MRI brain  Additional recommendations - Admit to hospitalist service - Continue q  2 hr neuro checks - Stat EEG - ESR, CRP - Temporal artery [place]  - Neurology will follow along ______________________________________________________________________   Signed, Elmer Picker, NP [facility]  Attending Neurologist's note:  I personally saw this patient, gathering history, performing a full neurologic examination, reviewing relevant labs, personally reviewing relevant imaging including head CT, CTA head and neck, CT perfusion, MRI brain, and formulated the assessment and plan, adding the note above for completeness and clarity to accurately reflect my thoughts   Brooke Dare MD-PhD [facility] [phone] CRITICAL CARE Performed by:  Gordy Councilman   Total critical care time: 50 minutes  Critical care time was exclusive of separately billable procedures and treating other patients.  Critical care was necessary to treat or prevent imminent or life-threatening deterioration.  Critical care was time spent personally by me on the following activities: development  of treatment plan with patient and/or surrogate as well as nursing, discussions with consultants, evaluation of patient's response to treatment, examination of patient, obtaining history from patient or surrogate, ordering and performing treatments and interventions, ordering and review of laboratory studies, ordering and review of radiographic studies, pulse oximetry and re-evaluation of patient's condition.

## 2023-09-03 NOTE — ED Triage Notes (Signed)
 Pt from home with ems for expressive aphasia and possible ataxia.  Not a very clear LSN, maybe 2 pm yesterday.  Pt c.o left sided headache, no weakness noted on exam. Pt a.o

## 2023-09-03 NOTE — Care Plan (Signed)
 FMTS Brief Progress Note  S: Patient seen at bedside with Dr. Ival Marines.  Patient's son is present and states that he thinks aphasia is worsening, though he is able to still communicate with his mom and understand most of what she is trying to express.  In the room patient is able to express to us  that she has a headache.  Sounds like this is unchanged from the last several days per her son.  No other concerns at this time   O: BP 123/75   Pulse 97   Temp 98.1 F (36.7 C)   Resp 16   Wt 90.5 kg   SpO2 93%   BMI 32.20 kg/m   General: Lying in bed, sleepy-appearing, no acute distress. HEENT: normocephalic, PERRLA, EOM grossly intact. Neuro: Mostly aphasic though able to answer yes and no questions. Psych: Alert and cooperative with normal attention span and concentration.   A/P: AMS  Wernicke Aphasia Neurology consulted. Continuing on LTM EEG tonight, preliminary results with focal slowing. - renally dosed Keppra per Neuro  - Orders reviewed. Labs for AM ordered, which was adjusted as needed.  - If condition changes, plan includes reevaluation.   Omar Bibber, DO 09/03/2023, 8:39 PM PGY-1, [facility] Night Resident  Please page [phone] with questions.

## 2023-09-03 NOTE — Hospital Course (Addendum)
 Melissa Sosa is a 78 y.o.female with a history of polymyalgia rheumatica, breast cancer, hypertension, and anxiety who was admitted to the [facility] Medicine Teaching Service at [facility] for new onset expressive and receptive aphasia.   Her hospital course is detailed below:  AMS  Stroke-like symptoms Prior to presentation at hospital patient was apparently found by a neighbor, who noted aphasia and possible ataxia. In the ED patient underwent extensive head imaging, which largely returned negative.  Questionable punctate focus in periventricular white matter of the left side, however this would not explain patient's symptomatology.  Started on long-term monitoring electroencephalogram after consultation with neurology, which showed focal slowing on the left side, possible postictal state but no epileptiform changes.  Patient had an episode of decreased mental status; repeat stat CT negative.  LP, serum altered mental status labs all returned negative. Primary differential: Focal seizures not captured on EEG. Started on Keppra , which titrated upward to 750 mg twice daily.  Loaded with IV Depakote.  With therapy, patient continued to improve with speech fluency.  She was recommended for CIR though declined; therefore, she was discharged with outpatient SLP.  Other chronic conditions were medically managed with home medications and formulary alternatives as necessary (polymyalgia rheumatica, DCIS, IDA, HTN)  PCP Follow-up Recommendations: Needs close neurology follow-up Continued SLP needs Continued daily prednisone  5 mg for polymyalgia rheumatica; recommend reevaluating need

## 2023-09-03 NOTE — ED Notes (Addendum)
 Family medicine at bedside (Dr. Grandville Lax)

## 2023-09-03 NOTE — Code Documentation (Addendum)
 Stroke Response Nurse Documentation Code Documentation  Melissa Sosa is a 78 y.o. female arriving to [facility]  via EMS on 09/03/2023 with past medical hx of HTN, PMR and breast cancer. On No antithrombotic. Code stroke was activated by ED.   Patient from home where she was LKW at yesterday at 2 PM  and now complaining of aphasia.   Stroke team at the bedside on patient's activation.. Labs drawn and patient cleared for CT by EDP. Patient to CT with team. NIHSS 4, see documentation for details and code stroke times. Patient with disoriented and Expressive aphasia  on exam. The following imaging was completed:  CT Head, CTA, and MRI. Patient is not a candidate for IV Thrombolytic due to outside of treatment window. Patient is not a candidate for IR due to LVO negative..   Care Plan:  NIHSS and VS q 2 for 12, then q 4. NPO until stroke swallow screen passed  Bedside handoff with ED RN Carolynne Citron.    [name]  Stroke Response RN

## 2023-09-03 NOTE — Progress Notes (Signed)
 Temporal artery duplex completed. Please see CV Procedures for preliminary results.  Estanislao Heimlich, RVT 09/03/23 12:54 PM

## 2023-09-03 NOTE — ED Notes (Signed)
 ED Provider at bedside.

## 2023-09-03 NOTE — ED Notes (Signed)
 EEG at bedside.

## 2023-09-03 NOTE — ED Provider Notes (Signed)
 [place] EMERGENCY DEPARTMENT AT [facility] Provider Note   CSN: [id number] Arrival date & time: 09/03/23  4098  An emergency department physician performed an initial assessment on this suspected stroke patient at 1127.  History  Chief Complaint  Patient presents with   Aphasia    Melissa Sosa is a 78 y.o. female.  78 y.o female with a PMH of hypertension presents to the ED via EMS with a chief complaint of change in speech.  According to EMS report, there was a neighbor that saw her this morning after she walked over their home.  Patient is experiencing some fluctuating expressive aphasia with possible ataxia.  She is also endorsing a left-sided headache.  According to family member in the room, she was evaluated for a sinus infection this past Friday.  Patient denies any falls, according to records she is not anticoagulated.  The history is provided by the patient.       Home Medications Prior to Admission medications   Medication Sig Start Date End Date Taking? Authorizing Provider  Cholecalciferol (VITAMIN D3) 2000 units capsule Take 2,000 Units by mouth daily.   Yes [provider]  Cyanocobalamin (VITAMIN B 12) 100 MCG LOZG Take 100 mcg by mouth daily.   Yes [provider]  cyclobenzaprine (FLEXERIL) 10 MG tablet Take 1 tablet (10 mg total) by mouth 2 (two) times daily as needed for muscle spasms. 12/15/15  Yes Scarlette Currier, MD  Ginger 500 MG CAPS Take 500 mg by mouth daily.   Yes [provider]  lisinopril-hydrochlorothiazide (PRINZIDE,ZESTORETIC) 10-12.5 MG tablet Take 0.5 tablets by mouth daily.   Yes [provider]  nortriptyline (PAMELOR) 25 MG capsule Take 25 mg by mouth at bedtime.   Yes [provider]  Omega-3 Fatty Acids (FISH OIL) 500 MG CAPS Take 1,000 mg by mouth daily.   Yes [provider]  ondansetron (ZOFRAN) 4 MG tablet Take 1 tablet (4 mg total) by mouth every 8 (eight) hours as  needed for nausea or vomiting. 11/07/20  Yes Tonya Fredrickson, MD  pantoprazole (PROTONIX) 20 MG tablet Take 1 tablet (20 mg total) by mouth daily. 11/07/20  Yes Tonya Fredrickson, MD  predniSONE (DELTASONE) 10 MG tablet Take 5 mg by mouth daily.   Yes [provider]  tamoxifen (NOLVADEX) 20 MG tablet Take 20 mg by mouth at bedtime.   Yes [provider]  Turmeric 500 MG CAPS Take 500 mg by mouth daily.   Yes [provider]      Allergies    Sulfamethoxazole-trimethoprim, Venlafaxine, Imitrex [sumatriptan], and Morphine and codeine    Review of Systems   Review of Systems  Constitutional:  Negative for chills, diaphoresis and fever.  Respiratory:  Negative for shortness of breath.   Cardiovascular:  Negative for chest pain.  Gastrointestinal:  Negative for abdominal pain.  Genitourinary:  Negative for dysuria.  Musculoskeletal:  Negative for back pain.  Neurological:  Positive for headaches. Negative for syncope, weakness and light-headedness.  All other systems reviewed and are negative.   Physical Exam Updated Vital Signs BP 106/64   Pulse 96   Temp 98.1 F (36.7 C)   Resp (!) 21   SpO2 98%  Physical Exam Vitals and nursing note reviewed.  HENT:     Head: Normocephalic.     Comments: No visible signs of trauma.    Nose: Congestion present.  Eyes:     Pupils: Pupils are equal, round, and reactive  to light.     Comments: Pupils are equal and reactive.  Cardiovascular:     Rate and Rhythm: Normal rate.  Pulmonary:     Effort: Pulmonary effort is normal.     Breath sounds: No wheezing.  Abdominal:     General: Abdomen is flat.     Palpations: Abdomen is soft.     Comments: No pain with palpation of her abdomen.  Musculoskeletal:     Cervical back: Normal range of motion and neck supple.  Skin:    General: Skin is warm and dry.  Neurological:     Mental Status: She is alert. She is confused.     Motor: No weakness.     Coordination:  Finger-Nose-Finger Test abnormal.     Comments: Patient appears somewhat confused, unable to express her words, however she is alert.  She appears ataxic does not follow commands.  Can move upper and lower extremities without focal motor weakness.     ED Results / Procedures / Treatments   Labs (all labs ordered are listed, but only abnormal results are displayed) Labs Reviewed  CBC - Abnormal; Notable for the following components:      Result Value   RBC 5.14 (*)    Hemoglobin 15.1 (*)    All other components within normal limits  COMPREHENSIVE METABOLIC PANEL WITH GFR - Abnormal; Notable for the following components:   Sodium 134 (*)    Glucose, Bld 152 (*)    All other components within normal limits  I-STAT CHEM 8, ED - Abnormal; Notable for the following components:   Glucose, Bld 153 (*)    Calcium, Ion 1.12 (*)    Hemoglobin 15.3 (*)    All other components within normal limits  CBG MONITORING, ED - Abnormal; Notable for the following components:   Glucose-Capillary 140 (*)    All other components within normal limits  PROTIME-INR  APTT  DIFFERENTIAL  ETHANOL  SEDIMENTATION RATE  C-REACTIVE PROTEIN  URINALYSIS, ROUTINE W REFLEX MICROSCOPIC    EKG None  Radiology MR BRAIN WO CONTRAST Result Date: 09/03/2023 CLINICAL DATA:  Provided history: Neuro deficit, acute, stroke suspected. EXAM: MRI HEAD WITHOUT CONTRAST TECHNIQUE: Multiplanar, multiecho pulse sequences of the brain and surrounding structures were obtained without intravenous contrast. COMPARISON:  Non-contrast head CT and CT angiogram head/neck performed earlier today 09/03/2023. FINDINGS: Brain: Mild generalized cerebral atrophy. Possible punctate acute infarct within the left periatrial white matter (versus volume averaging of adjacent choroid plexus)(series 3, image 30). Multifocal T2 FLAIR hyperintense signal abnormality within the cerebral white matter, nonspecific but compatible with mild chronic small  vessel ischemic disease. Partially empty sella turcica. No cortical encephalomalacia is identified. No evidence of an intracranial mass. No chronic intracranial blood products. No extra-axial fluid collection. No midline shift. Vascular: Maintained flow voids within the proximal large arterial vessels. Skull and upper cervical spine: No focal worrisome marrow lesion. Sinuses/Orbits: No mass or acute finding within the imaged orbits. Prior but ocular lens replacement. Trace mucosal thickening within the bilateral ethmoid and left sphenoid sinuses. IMPRESSION: 1. Possible punctate acute infarct within the left periatrial white matter (versus volume averaging of adjacent choroid plexus). 2. Background mild cerebral white matter chronic small vessel ischemic disease. 3. Mild generalized cerebral atrophy. 4. Minor paranasal sinus mucosal thickening. Electronically Signed   By: Jackey Loge D.O.   On: 09/03/2023 13:52   VAS [place] TEMPORAL ARTERY Result Date: 09/03/2023 DEFAULT Cardiovascular   Pt. Name: Iara Frederic  Pt. DOB: 11-Apr-1946         ID: [id number]                  Pt. Age: 66 years Study Type: VAS [place] TEMPORAL ARTERY   Exam Date: 09/03/2023 Ref. Phys.: [id number] [facility]   Review Date:  Diag Phys:                           Modality: [place]  Indications: Left temporal headache History: HTN Diagnosis: Impression: No significant stenosis or Halo seen. Summary:   Preliminary    CT ANGIO HEAD NECK W WO CM W PERF (CODE STROKE) Result Date: 09/03/2023 CLINICAL DATA:  Provided history: Neuro deficit, acute, stroke suspected. Aphasia. Possible ataxia. EXAM: CT ANGIOGRAPHY HEAD AND NECK CT PERFUSION BRAIN TECHNIQUE: Multidetector CT imaging of the head and neck was performed using the standard protocol during bolus administration of intravenous contrast. Multiplanar CT image reconstructions and MIPs were obtained to evaluate the vascular anatomy. Carotid stenosis measurements (when applicable) are obtained  utilizing NASCET criteria, using the distal internal carotid diameter as the denominator. Multiphase CT imaging of the brain was performed following IV bolus contrast injection. Subsequent parametric perfusion maps were calculated using RAPID software. RADIATION DOSE REDUCTION: This exam was performed according to the departmental dose-optimization program which includes automated exposure control, adjustment of the mA and/or kV according to patient size and/or use of iterative reconstruction technique. CONTRAST:  OMNIPAQUE IOHEXOL 350 MG/ML SOLN COMPARISON:  Non-contrast head CT performed earlier today 09/03/2023. FINDINGS: CTA NECK FINDINGS Aortic arch: Standard aortic branching. Atherosclerotic plaque within the visualized thoracic aorta and proximal major branch vessels of the neck. Streak/beam hardening artifact arising from a dense contrast bolus partially obscures the right subclavian artery. Within this limitation, there is no appreciable hemodynamically significant innominate or proximal subclavian artery stenosis. Right carotid system: Beam hardening artifact arising from a dense contrast bolus partially obscures the proximal common carotid artery. Within this limitation, the common carotid and internal carotid arteries are patent within the neck without measurable stenosis. Mild atherosclerotic plaque at both carotid bifurcation. Left carotid system: CCA and ICA patent within the neck without measurable stenosis. Mild atherosclerotic plaque at the carotid bifurcation. Vertebral arteries: Codominant and patent within the neck without stenosis. Skeleton: Non severe reversal of the expected cervical lordosis. Grade 1 anterolisthesis at C2-C3 and C3-C4. C4-C5 vertebral ankylosis. Cervical spondylosis. Other neck: No neck mass or cervical lymphadenopathy. Upper chest: No consolidation within the imaged lung apices. Review of the MIP images confirms the above findings CTA HEAD FINDINGS Anterior  circulation: The intracranial internal carotid arteries are patent. Non-stenotic atherosclerotic plaque within both vessels. The M1 middle cerebral arteries are patent. No M2 proximal branch occlusion or high-grade proximal stenosis. The anterior cerebral arteries are patent. No intracranial aneurysm is identified. Posterior circulation: The intracranial vertebral arteries are patent. The basilar artery is patent. 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 The posterior cerebral arteries are patent. Mild stenosis within the right PCA proximal P2 segment. Posterior communicating arteries are diminutive or absent, bilaterally. Venous sinuses: Within the limitations of contrast timing, no convincing thrombus. Anatomic variants: As described. Review of the MIP images confirms the above findings CT Brain Perfusion Findings: CBF (<30%) Volume: 0mL Perfusion (Tmax>6.0s) volume: 0mL Mismatch Volume: 0mL Infarction Location:None identified. No emergent large vessel occlusion identified. 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 These results were  communicated to Dr. Cleone Dad at 12:07 pmon 4/15/2025by text page via the [facility] messaging system. IMPRESSION: CTA neck: 1. The common carotid and internal carotid arteries are patent within the neck without stenosis. Mild atherosclerotic plaque bilaterally, as described. 2. The vertebral arteries are patent within the neck without stenosis or significant atherosclerotic disease. 3. Aortic Atherosclerosis (ICD10-I70.0). CTA head: 1. No proximal intracranial large vessel occlusion identified. 2. Intracranial atherosclerotic disease as described. 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 CT perfusion head: The perfusion software identifies no core infarct. The perfusion software identifies no critically hypoperfused parenchyma (utilizing  Tmax>6 seconds threshold). No mismatch volume reported. Electronically Signed   By: Bascom Lily D.O.   On: 09/03/2023 12:07   CT HEAD WO CONTRAST ( ) Result Date: 09/03/2023 CLINICAL DATA:  Provided history: Transient ischemic attack. Expressive aphasia. EXAM: CT HEAD WITHOUT CONTRAST TECHNIQUE: Contiguous axial images were obtained from the base of the skull through the vertex without intravenous contrast. RADIATION DOSE REDUCTION: This exam was performed according to the departmental dose-optimization program which includes automated exposure control, adjustment of the mA and/or kV according to patient size and/or use of iterative reconstruction technique. COMPARISON:  None. FINDINGS: Mildly motion degraded exam. Within this limitation, findings are as follows. Brain: Mild generalized cerebral atrophy. Patchy and ill-defined hypoattenuation within the cerebral white matter, nonspecific but compatible with mild chronic small vessel ischemic disease. Partially empty sella turcica. There is no acute intracranial hemorrhage. No demarcated cortical infarct. No extra-axial fluid collection. No evidence of an intracranial mass. No midline shift. Vascular: No hyperdense vessel.  Atherosclerotic calcifications. Skull: No calvarial fracture or aggressive osseous lesion. Sinuses/Orbits: No mass or acute finding within the imaged orbits. No significant paranasal sinus disease at the imaged levels. No evidence of an acute intracranial abnormality. These results were communicated to Dr. Cleone Dad At 11:33 amon 4/15/2025by text page via the [facility] messaging system. Please note, there was a delay in radiology image interpretation as the examination was not initially assigned stroke code status. IMPRESSION: 1. Mildly motion degraded exam. 2. No evidence of an acute intracranial abnormality. 3. Mild cerebral atrophy and cerebral white matter chronic small vessel ischemic disease. Electronically Signed   By: Bascom Lily D.O.    On: 09/03/2023 11:34    Procedures .Critical Care  Performed by: Zohar Maroney, PA-C Authorized by: Neilson Oehlert, PA-C   Critical care provider statement:    Critical care time (minutes):  45   Critical care start time:  09/03/2023 12:30 PM   Critical care end time:  09/03/2023 1:15 PM   Critical care was necessary to treat or prevent imminent or life-threatening deterioration of the following conditions:  Metabolic crisis and CNS failure or compromise   Critical care was time spent personally by me on the following activities:  Development of treatment plan with patient or surrogate, discussions with consultants, evaluation of patient's response to treatment, examination of patient, ordering and review of laboratory studies, ordering and review of radiographic studies, ordering and performing treatments and interventions, pulse oximetry, re-evaluation of patient's condition and review of old charts     Medications Ordered in ED Medications  sodium chloride flush (NS) 0.9 % injection 3 mL (3 mLs Intravenous Not Given 09/03/23 1107)  acetaminophen (TYLENOL) tablet 1,000 mg (has no administration in time range)  iohexol (OMNIPAQUE) 350 MG/ML injection 100 mL (100 mLs Intravenous Contrast Given 09/03/23 1138)  ondansetron (ZOFRAN) injection 4 mg (4 mg Intravenous Given 09/03/23 1428)  ED Course/ Medical Decision Making/ A&P                                 Medical Decision Making Amount and/or Complexity of Data Reviewed Labs: ordered. Radiology: ordered.  Risk OTC drugs. Prescription drug management.   This patient presents to the ED for concern of aphasia, this involves a number of treatment options, and is a complaint that carries with it a  high risk of complications and morbidity.  The differential diagnosis includes Stroke, metabolic derangement, infection versus encephalopathy.    Co morbidities: Discussed in HPI   Brief History:  See HPI.   EMR reviewed including pt  PMHx, past surgical history and past visits to ER.   See HPI for more details   Lab Tests:  I ordered and independently interpreted labs.  The pertinent results include:    I personally reviewed all laboratory work and imaging. Metabolic panel without any acute abnormality specifically kidney function within normal limits and no significant electrolyte abnormalities. CBC without leukocytosis or significant anemia.   Imaging Studies:  CT Head showed: IMPRESSION:  1. Mildly motion degraded exam.  2. No evidence of an acute intracranial abnormality.  3. Mild cerebral atrophy and cerebral white matter chronic small  vessel ischemic disease.   CTA head and neck along with MRI Brain pending.   Cardiac Monitoring:  The patient was maintained on a cardiac monitor.  I personally viewed and interpreted the cardiac monitored which showed an underlying rhythm of: NSR EKG non-ischemic   Medicines ordered:  N/A  Consults:  11: 14 AM I requested consultation with Dr. Cleone Dad,  and discussed lab and imaging findings as well as pertinent plan - they recommend: further imaging and will evaluate patient in the ED.   Reevaluation:  After the interventions noted above I re-evaluated patient and found that they have :stayed the same  Social Determinants of Health:  The patient's social determinants of health were a factor in the care of this patient  Problem List / ED Course:  Patient presents to the ED after feeling "unwell ", she walked over to her neighbor's house across the street, reports she was somewhat stumbling and it was difficult for her to catch her gait.  On today's visit patient has significant expressive aphasia, this does fluctuate when you speak to her.  Her son is on his way back from the beach but he FaceTime with her last night.  Her neighbor last saw her normal this morning around 9 AM, she arrived to the ED after [place]. Originally patient arrived with stable vital  signs, no hypoxia, slight increase in her heart rate and slight elevation in BP.  She did have her blood pressure medication discontinue a few days ago according to medical records which have been reviewed.  CMP with no electrolyte derangement to account for the symptoms.  Creatinine level is unremarkable.  CBC with no leukocytosis, hemoglobin slightly elevated.  PT and INR normal, ethanol levels also normal.  She is not currently anticoagulated. On exam she does have difficulty with finger-to-nose, does not have any focal weakness but is very hard to understand due to her expressive aphasia. Neurology has conducted a CTA, CT, MRI brain which do not show any acute infarct at this time.  She has not had any fever, no neck rigidity but was told that she likely had a sinus infection.  We discussed admission  in order to complete EEG and further testing and rule out metabolic component causing her symptoms. 3:23 PM Spoke to family medicine who will admit patient under assigned.   Dispostion:  After consideration of the diagnostic results and the patients response to treatment, I feel that the patent would benefit from admission.   Final Clinical Impression(s) / ED Diagnoses Final diagnoses:  Aphasia    Rx / DC Orders ED Discharge Orders     None         Juanda Luba, PA-C 09/03/23 1524    Clay Cummins, MD 09/03/23 1542

## 2023-09-03 NOTE — H&P (Addendum)
 Hospital Admission History and Physical Service Pager: [phone]  Patient name: Melissa Sosa Medical record number: [id number] Date of Birth: [birth year] Age: 78 y.o. Gender: female  Primary Care Provider: Pearson Forster, MD Consultants: Neurology Code Status: FULL  Preferred Emergency Contact: Vincent Peyer, [phone]  Chief Complaint: Aphasia  Assessment and Plan: Melissa Sosa is a 78 y.o. female presenting with new onset expressive and receptive aphasia.  Differential includes:  Focal seizure, possible: Preliminary EEG shows left-sided generalized slowing. Imaging shows possible punctate acute infarct and left periatrial white matter -- however, possibly this is artifactual and in fact represents adjacent choroid plexus.  No reported history of seizure. Left-sided CVA, possible: Imaging as above.  It is possible that a small infarct in the left temporoparietal would cause a Wernicke's aphasia type picture.  Patient has a history of breast cancer, which could increase likelihood of hypercoagulable picture.  However, this is a very pronounced presentation and it seems unlikely that an infarct of the size would show sequelae this pronounced or prolonged. TIA, less likely: If punctate acute infarct is not artifactual, by definition this would not be a TIA.  As it stands, TIAs do not cause the generalized slowing shown on EEG. Assessment & Plan Stroke-like symptoms CVA versus focal seizure.  Last known well 04/14 at 1400 according to EDP.  Patient has expressive aphasia consistent with Wernicke's aphasia.  Also with mild receptive aphasia.  Minimal motor involvement.  CT head, CT head and neck, and CT perfusion were negative.  MRI of brain without contrast showed possible punctate acute infarct in the left periatrial white matter.  Could be choroid plexus.  Per Dr. Iver Nestle with neurology, preliminary LTM EEG read shows focal left slowing; she will be started on Keppra.  Final report pending.   While less likely, she does have a history of migraines, so question sequelae of this. - Admit to [facility] Medicine Teaching Service, Dr. Lum Babe admitting - Start IV Keppra 500 mg twice daily, per neuro recs, appreciate neuro assistance - Continue LTM EEG, awaiting final read - NPO until speech stroke eval - Seizure precautions - PT/OT/SLP - Reversible causes of AMS labs as follows: TSH, vitamin B1, folate, vitamin B12, RPR, HIV - Complete echocardiogram - Will schedule Tylenol 1000 mg every 6 hours for now given difficulty asking for pain medications for headache Chronic health problem Polymyalgia rheumatica: Continue prednisone.  CRP/sed rate WNL Ductal carcinoma in situ: Continue tamoxifen IDA: Hgb 15.1, monitor clinically Hypertension: Acceptable, continue home lisinopril hydrochlorothiazide  FEN/GI: NPO until speech stroke eval VTE Prophylaxis: Lovenox  Disposition: Pending PT/OT and clinical improvement  History of Present Illness:  Melissa Sosa is a 78 y.o. female presenting with a past medical history of polymyalgia rheumatica, breast cancer, hypertension, and anxiety new onset expressive and receptive aphasia.  On interview with patient, unable to meaningfully participate given her aphasia.  Is able to state that she has ongoing ache around the left orbit.  Also notes that she is tired.  Presentation appears to be consistent with Wernicke's aphasia.  Neurological assessment/findings discussed below.  Per ED note which received report from EMS, a neighbor saw her this morning around 9 AM after she walked over to their home.  Patient was having aphasia at that time and possible ataxia.  Family member in the room with the EDP stated that she was evaluated for sinus infection this past Friday after having symptoms since Wednesday.  She also felt tired yesterday and that her  sinuses were tight.  Yesterday night, she FaceTime to her son, she seemed to be in her normal state of health.   At baseline, she is able to complete all activities of daily living independently.  In the ED, patient underwent extensive head imaging, which largely returned negative.  Started on long-term monitoring electroencephalogram after consultation with neurology.  Review Of Systems: Per HPI.  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   Pertinent Past Surgical History: Hysterectomy Arthroscopies Remainder reviewed in history tab.   Pertinent Social History: Tobacco use: Never per chart review Alcohol use: No per chart review Other Substance use: Never per chart review Appears to live alone  Pertinent Family History: None pertinent Remainder reviewed in history tab.   Important Outpatient Medications: Tamoxifen 20 mg nightly Lisinopril - hydrochlorothiazide 10 to 12.5 mg 0.5 tablets daily Nortriptyline 25 mg nightly Prednisone 10 mg daily Zofran 4 mg every 8 hours for nausea and vomiting Protonix 20 mg daily Vitamin D 100 mcg daily Flexeril 10 mg twice daily for muscle spasms Vitamin D3, turmeric, ginger, fish oil Remainder reviewed in medication history.   Objective: BP 139/62   Pulse 94   Temp 98.1 F (36.7 C)   Resp 20   Wt 90.5 kg   SpO2 96%   BMI 32.20 kg/m  Exam: General: Older woman in no acute distress laying in hospital bed, appears engaged in conversation although she cannot meaningfully participate in large portions of it. Eyes: Anicteric Cardiovascular: Regular rate and rhythm, no murmurs rubs or gallops Respiratory: To auscultation in bilateral anterior lung fields, no wheezes, rhonchi, rales appreciated Gastrointestinal: Bowel sounds present, no tenderness on palpation MSK: No bilateral lower extremity edema noted Derm: No lesions, rashes  noted Neuro:   Mental status: Patient expresses significant receptive and expressive aphasia, similar to Wernicke's presentation.  Able to understand short questions and replies in short answers ("I am tired"), but almost entirely word salad.  No slurring noted.  Repeats 3 unintelligible neologisms when asked to repeat "no ifs, ands or buts."  Appears slightly troubled by inability to speak, but it seems that she is under the impression that most of her words are being understood by providers.  Is unable to repeat her name, or write and sentences.  Appears confused when given a pen and paper to write on.  Can ends consistently follow repeated verbal commands (show 2 fingers, raise right arm) but when asked to raise right leg, raises right arm instead.   CN: II: Unable to assess, pupillary constriction bilateral III-VI: EOMI grossly intact, V: Facial sensation intact VII: Unable to assess VIII: Hearing grossly intact IX - X: Soft palate raises bilaterally XI: Shoulder shrug bilateral XII: Tongue protrusion intact  Sensation: Well manage by understanding, patient appears to endorse intact facial sensation Motor: Patient is unable to fully participate in exam, but is at least 4/5 strength in upper and lower extremities.  Psych: Mood normal, flat affect  Labs:  CBC BMET  Recent Labs  Lab 09/03/23 1042 09/03/23 1048  WBC 9.2  --   HGB 15.1* 15.3*  HCT 44.2 45.0  PLT 299  --    Recent Labs  Lab 09/03/23 1042 09/03/23 1048  NA 134* 135  K 3.8 3.9  CL 100 100  CO2 22  --   BUN 11 13  CREATININE 0.77 0.80  GLUCOSE  152* 153*  CALCIUM 9.6  --     Pertinent additional labs   CXR/CRP: Within normal limits EtOH: Negative NA: 134 Hgb 15.1 WBC: 9.2 UA: Unremarkable  EKG: NSR, QTc 422, increased amplitude of QRS complexes likely due to positioning of leads  Imaging Studies Performed:  CT head without contrast (4/15):  IMPRESSION: 1. Mildly motion degraded exam. 2. No  evidence of an acute intracranial abnormality. 3. Mild cerebral atrophy and cerebral white matter chronic small vessel ischemic disease.  CT head and neck with without/CT perfusion (4/15):  IMPRESSION: CTA neck:   1. The common carotid and internal carotid arteries are patent within the neck without stenosis. Mild atherosclerotic plaque bilaterally, as described. 2. The vertebral arteries are patent within the neck without stenosis or significant atherosclerotic disease. 3. Aortic Atherosclerosis (ICD10-I70.0).   CTA head:   1. No proximal intracranial large vessel occlusion identified. 2. Intracranial atherosclerotic disease as described. 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   CT perfusion head:   The perfusion software identifies no core infarct. The perfusion software identifies no critically hypoperfused parenchyma (utilizing Tmax>6 seconds threshold). No mismatch volume reported.  MRI brain without contrast (4/15):  IMPRESSION: 1. Possible punctate acute infarct within the left periatrial white matter (versus volume averaging of adjacent choroid plexus). 2. Background mild cerebral white matter chronic small vessel ischemic disease. 3. Mild generalized cerebral atrophy. 4. Minor paranasal sinus mucosal thickening.  XR chest (4/15):  IMPRESSION: No acute cardiopulmonary disease.  Gwyndolyn Lerner, MD 09/03/2023, 5:07 PM PGY-1, [facility] Family Medicine  FPTS Intern pager: [phone], text pages welcome Secure chat group [facility] Teaching Service   I agree with the assessment and plan as documented above.  Genetta Kenning, MD PGY-2, [facility]

## 2023-09-03 NOTE — Progress Notes (Signed)
 LTM EEG hooked up and running - no initial skin breakdown - push button tested, family education on event button. Pt in ED unmonitored by [facility] at this time

## 2023-09-03 NOTE — Assessment & Plan Note (Addendum)
 Polymyalgia rheumatica: Continue prednisone.  CRP/sed rate WNL Ductal carcinoma in situ: Continue tamoxifen IDA: Hgb 15.1, monitor clinically Hypertension: Acceptable, continue home lisinopril hydrochlorothiazide

## 2023-09-03 NOTE — Progress Notes (Signed)
 Patient and EEG machine transferred to 041

## 2023-09-03 NOTE — ED Provider Notes (Signed)
 I provided a substantive portion of the care of this patient.  I personally made/approved the management plan for this patient and take responsibility for the patient management.    .Critical Care  Performed by: Clay Cummins, MD Authorized by: Clay Cummins, MD   Critical care provider statement:    Critical care time (minutes):  30   Critical care start time:  09/03/2023 12:00 PM   Critical care end time:  09/03/2023 12:30 PM   Critical care time was exclusive of:  Separately billable procedures and treating other patients and teaching time   Critical care was necessary to treat or prevent imminent or life-threatening deterioration of the following conditions:  CNS failure or compromise   Critical care was time spent personally by me on the following activities:  Ordering and performing treatments and interventions, re-evaluation of patient's condition and discussions with consultants     Clay Cummins, MD 09/03/23 1546

## 2023-09-03 NOTE — Progress Notes (Addendum)
 Exam unchanged with essentially stable aphasia for me  Focal left slowing on EEG prelim report, final report pending  Recommend starting keppra at lowest dose appropriate for her creatine clearance  CrCl 80 to 130 mL/minute/1.73 m2: 500 mg to 1.5 g every 12 hours.  CrCl 50 to <80 mL/minute/1.73 m2: 500 mg to 1 g every 12 hours.  CrCl 30 to <50 mL/minute/1.73 m2: 250 to 750 mg every 12 hours.  CrCl 15 to <30 mL/minute/1.73 m2: 250 to 500 mg every 12 hours.  CrCl <15 mL/minute/1.73 m2: 250 to 500 mg every 24 hours (expert opinion).  Asked nursing to obtain patient weight so CrCl can be calculated  CrCl cannot be calculated (Unknown ideal weight.).   Suspect CrCl is > 50 so will start with 500 mg BID, with one time dose of 500 mg IV now as well   Neurology will continue to follow along

## 2023-09-04 ENCOUNTER — Inpatient Hospital Stay (HOSPITAL_COMMUNITY)

## 2023-09-04 ENCOUNTER — Encounter (HOSPITAL_COMMUNITY)

## 2023-09-04 DIAGNOSIS — E860 Dehydration: Secondary | ICD-10-CM | POA: Diagnosis not present

## 2023-09-04 DIAGNOSIS — R569 Unspecified convulsions: Secondary | ICD-10-CM

## 2023-09-04 DIAGNOSIS — R519 Headache, unspecified: Secondary | ICD-10-CM | POA: Diagnosis not present

## 2023-09-04 DIAGNOSIS — R4701 Aphasia: Secondary | ICD-10-CM | POA: Diagnosis not present

## 2023-09-04 DIAGNOSIS — I10 Essential (primary) hypertension: Secondary | ICD-10-CM

## 2023-09-04 DIAGNOSIS — R410 Disorientation, unspecified: Secondary | ICD-10-CM | POA: Diagnosis not present

## 2023-09-04 DIAGNOSIS — R2990 Unspecified symptoms and signs involving the nervous system: Secondary | ICD-10-CM | POA: Diagnosis not present

## 2023-09-04 LAB — MENINGITIS/ENCEPHALITIS PANEL (CSF)

## 2023-09-04 LAB — CSF CELL COUNT WITH DIFFERENTIAL
RBC Count, CSF: 24 /mm3 — ABNORMAL HIGH
RBC Count, CSF: 3 /mm3 — ABNORMAL HIGH
Tube #: 1
Tube #: 4
WBC, CSF: 2 /mm3 (ref 0–5)
WBC, CSF: 4 /mm3 (ref 0–5)

## 2023-09-04 LAB — BASIC METABOLIC PANEL WITH GFR
Anion gap: 11 (ref 5–15)
BUN: 8 mg/dL (ref 8–23)
CO2: 23 mmol/L (ref 22–32)
Calcium: 9.5 mg/dL (ref 8.9–10.3)
Chloride: 103 mmol/L (ref 98–111)
Creatinine, Ser: 0.8 mg/dL (ref 0.44–1.00)
GFR, Estimated: >60 mL/min (ref >60)
Glucose, Bld: 102 mg/dL — ABNORMAL HIGH (ref 70–99)
Potassium: 3.4 mmol/L — ABNORMAL LOW (ref 3.5–5.1)
Sodium: 137 mmol/L (ref 135–145)

## 2023-09-04 LAB — ECHOCARDIOGRAM COMPLETE
AR max vel: 2.09 cm2
AV Area VTI: 2.02 cm2
AV Area mean vel: 2.14 cm2
AV Mean grad: 3 mmHg
AV Peak grad: 4.9 mmHg
Ao pk vel: 1.11 m/s
Area-P 1/2: 3.45 cm2
Height: 66 in
S' Lateral: 3.2 cm
Weight: 3312.19 [oz_av]

## 2023-09-04 LAB — LIPID PANEL
Cholesterol: 157 mg/dL (ref 0–200)
HDL: 57 mg/dL (ref >40)
LDL Cholesterol: 87 mg/dL (ref 0–99)
Total CHOL/HDL Ratio: 2.8 ratio
Triglycerides: 66 mg/dL (ref <150)
VLDL: 13 mg/dL (ref 0–40)

## 2023-09-04 LAB — PROTEIN AND GLUCOSE, CSF
Glucose, CSF: 66 mg/dL (ref 40–70)
Total  Protein, CSF: 30 mg/dL (ref 15–45)

## 2023-09-04 LAB — CBC
HCT: 40.6 % (ref 36.0–46.0)
Hemoglobin: 13.8 g/dL (ref 12.0–15.0)
MCH: 29 pg (ref 26.0–34.0)
MCHC: 34 g/dL (ref 30.0–36.0)
MCV: 85.3 fL (ref 80.0–100.0)
Platelets: 251 10*3/uL (ref 150–400)
RBC: 4.76 MIL/uL (ref 3.87–5.11)
RDW: 13.2 % (ref 11.5–15.5)
WBC: 7.3 10*3/uL (ref 4.0–10.5)
nRBC: 0 % (ref 0.0–0.2)

## 2023-09-04 LAB — HEMOGLOBIN A1C
Hgb A1c MFr Bld: 5.6 % (ref 4.8–5.6)
Mean Plasma Glucose: 114.02 mg/dL

## 2023-09-04 LAB — TSH: TSH: 0.615 u[IU]/mL (ref 0.350–4.500)

## 2023-09-04 LAB — VITAMIN B12: Vitamin B-12: 488 pg/mL (ref 180–914)

## 2023-09-04 LAB — FOLATE: Folate: >40 ng/mL (ref >5.9)

## 2023-09-04 MED ORDER — ORAL CARE MOUTH RINSE: 15.0000 mL | OROMUCOSAL | Status: DC | PRN

## 2023-09-04 MED ORDER — IBUPROFEN 200 MG PO TABS
400.0000 mg | ORAL_TABLET | Freq: Once | ORAL | Status: AC
  Administered 2023-09-04: 400 mg via ORAL
  Filled 2023-09-04: qty 2

## 2023-09-04 MED ORDER — POTASSIUM CHLORIDE CRYS ER 20 MEQ PO TBCR
40.0000 meq | EXTENDED_RELEASE_TABLET | Freq: Once | ORAL | Status: AC
  Administered 2023-09-04: 40 meq via ORAL
  Filled 2023-09-04: qty 2

## 2023-09-04 MED ORDER — LIDOCAINE 1 % OPTIME INJ - NO CHARGE
5.0000 mL | Freq: Once | INTRAMUSCULAR | Status: AC
  Administered 2023-09-04: 5 mL via INTRADERMAL
  Filled 2023-09-04: qty 6

## 2023-09-04 MED ORDER — ENOXAPARIN SODIUM 40 MG/0.4ML IJ SOSY
40.0000 mg | PREFILLED_SYRINGE | INTRAMUSCULAR | Status: DC
  Administered 2023-09-04 – 2023-09-06 (×3): 40 mg via SUBCUTANEOUS
  Filled 2023-09-04 (×3): qty 0.4

## 2023-09-04 MED ORDER — LEVETIRACETAM 500 MG PO TABS
500.0000 mg | ORAL_TABLET | Freq: Two times a day (BID) | ORAL | Status: DC
  Administered 2023-09-04: 500 mg via ORAL

## 2023-09-04 MED ORDER — LEVETIRACETAM IN NACL 500 MG/100ML IV SOLN: 500.0000 mg | Freq: Two times a day (BID) | INTRAVENOUS | Status: DC

## 2023-09-04 NOTE — Assessment & Plan Note (Addendum)
 Per family, some worsening of aphasia, however remains understandable, answering "yes, no.)  Somewhat drowsy this morning, briefly awakens to voice and light touch.  After neurology assessment, they have ordered a stat head CT without contrast for worsening of aphasia, will follow with lumbar puncture if negative. AMS labs unremarkable thus far.  Long-term monitoring EEG read showed cortical dysfunction likely secondary to underlying structural abnormality, postictal. No seizure captured.   - Appreciate further neurology recommendations - Continue IV Keppra 500 mg twice daily - Awaiting stat head CT, and follow-up LP if negative - Awaiting further AMS labs (in process): Thiamine, RPR, HIV - Await complete echocardiogram today - Speech/language/cognitive eval ordered, appreciate SLP recs - Seizure precautions - Delirium precautions - PT/OT - Continue Tylenol 1000 mg every 6 hours for now given difficulty asking for pain medications for headache -A.m. BMP

## 2023-09-04 NOTE — Procedures (Signed)
 PROCEDURE SUMMARY:  Successful fluoroscopic guided lumbar puncture. No immediate complications.  Pt tolerated well.   EBL = none  Please see full dictation in imaging section of Epic for procedure details.

## 2023-09-04 NOTE — Evaluation (Addendum)
 Speech Language Pathology Evaluation Patient Details Name: Melissa Sosa MRN: [id number] DOB: 1945-06-30 Today's Date: 09/04/2023 Time: 4098-1191 SLP Time Calculation (min) (ACUTE ONLY): 17 min  Problem List:  Patient Active Problem List   Diagnosis Date Noted   Stroke-like symptoms 09/03/2023   Chronic health problem 09/03/2023   Aphasia 09/03/2023   Past Medical History:  Past Medical History:  Diagnosis Date   Anxiety    Breast cancer (HCC)    Hypertension    PMR (polymyalgia rheumatica) (HCC)    Pneumonia    Past Surgical History: History reviewed. No pertinent surgical history. HPI:  Melissa Sosa is a 78 y.o. female with hx HTN, PMR, migraine, depression, of presenting with aphasia. MRI possible punctate acute infarct within the left periatrial white matter (versus volume averaging of adjacent choroid plexus), background mild cerebral white matter chronic small vessel ischemic disease. Repeat CT scheduled due to decreased verbal output.   Assessment / Plan / Recommendation Clinical Impression  Pt assessed with daughter at bedside who states that expression worsened from yesterday and MD obtaining a head CT. Pt demonstrates fluctuating severity of expressive, receptive aphasia and apraxia. During assessment pt was dysfluent responding in single words with mixture of appropriate words mixed with neologisms. SLP returned to room to ask a question and pt spoke in fluent sentences x 2 with phonemic paraphasias. Apraxic errors with phoneme substitutions with repetition and neologisms. Neologisms with confrontational naming not facilitated with written word. She imitated "one" but unable to count 1-5 in unision with visual assistance with decreased awareness to errors. Comprehension decreased for basic biographical and environmental yes/no questions to 0% although followed one step commands wtih 60% accuracy with motor apraxia and perseverations. Also noted inattention to right side of  environment. Educated daughter and provided strategies to assist with expressive and receptive language. ST will follow pt on acute venue and recommend > 3 hours at inpatient rehab venue.    SLP Assessment  SLP Recommendation/Assessment: Patient needs continued Speech Lanaguage Pathology Services SLP Visit Diagnosis: Aphasia (R47.01);Apraxia (R48.2)    Recommendations for follow up therapy are one component of a multi-disciplinary discharge planning process, led by the attending physician.  Recommendations may be updated based on patient status, additional functional criteria and insurance authorization.    Follow Up Recommendations  Acute inpatient rehab (3hours/day)    Assistance Recommended at Discharge  Frequent or constant Supervision/Assistance  Functional Status Assessment Patient has had a recent decline in their functional status and demonstrates the ability to make significant improvements in function in a reasonable and predictable amount of time.  Frequency and Duration min 2x/week  2 weeks      SLP Evaluation Cognition  Overall Cognitive Status: Impaired/Different from baseline Arousal/Alertness: Awake/alert Orientation Level: Oriented to person Attention: Sustained Sustained Attention: Appears intact Memory:  (TBA) Awareness: Impaired Awareness Impairment: Emergent impairment Problem Solving:  (TBA) Behaviors: Perseveration Safety/Judgment: Impaired       Comprehension  Auditory Comprehension Overall Auditory Comprehension: Impaired Yes/No Questions: Impaired Basic Biographical Questions: 0-25% accurate (0/1) Basic Immediate Environment Questions: 0-24% accurate (0/1) Commands: Impaired One Step Basic Commands: 50-74% accurate (60%) Two Step Basic Commands: 0-24% accurate (0/1 motor apraxia and perseveration) Visual Recognition/Discrimination Discrimination: Not tested Reading Comprehension Reading Status: Impaired Word level: Impaired Sentence Level: Not  tested    Expression Expression Primary Mode of Expression: Verbal Verbal Expression Overall Verbal Expression: Impaired Initiation: Impaired Automatic Speech:  (count not count with verbal/visual cues) Level of Generative/Spontaneous Verbalization: Word;Sentence (fluctuates within a 15  minute span) Repetition: Impaired (one neologism and one apraxic error) Level of Impairment: Word level Naming: Impairment Confrontation: Impaired (0%) Verbal Errors: Phonemic paraphasias;Neologisms;Semantic paraphasias;Not aware of errors (dtr stated pt was aware of errors yesterday) Pragmatics: No impairment Written Expression Written Expression: Not tested   Oral / Motor  Oral Motor/Sensory Function Overall Oral Motor/Sensory Function:  (unable due to significant apraxia) Motor Speech Overall Motor Speech: Impaired Respiration: Within functional limits Phonation: Normal Resonance: Within functional limits Articulation: Within functional limitis Intelligibility: Intelligible Motor Planning: Impaired Level of Impairment: Word Motor Speech Errors: Inconsistent;Unaware            Melissa Sosa 09/04/2023, 11:40 AM

## 2023-09-04 NOTE — Progress Notes (Signed)
 PT Cancellation Note  Patient Details Name: Jamielee Mchale MRN: [id number] DOB: 1945-09-20   Cancelled Treatment:    Reason Eval/Treat Not Completed: Patient at procedure or test/unavailable (Per RN, NP just order STAT CT on pt. Pt also noted with LP scheduled today. PT will hold until after CT and LP. Will follow up later if time allows.)   Jettie Mannor 09/04/2023, 11:12 AM

## 2023-09-04 NOTE — Progress Notes (Signed)
 LTM EEG discontinued - no skin breakdown at [facility].

## 2023-09-04 NOTE — Progress Notes (Signed)
 Daily Progress Note Intern Pager: [phone]  Patient name: Melissa Sosa Medical record number: [id number] Date of birth: 20-Jul-1945 Age: 78 y.o. 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  Received 1L IV fluid overnight as she appeared hemoconcentrated. Appreciate neurology recommendations concerning further management of new aphasia. Assessment & Plan Stroke-like symptoms Per family, some worsening of aphasia, however remains understandable, answering "yes, no.)  Somewhat drowsy this morning, briefly awakens to voice and light touch.  After neurology assessment, they have ordered a stat head CT without contrast for worsening of aphasia, will follow with lumbar puncture if negative. AMS labs unremarkable thus far.  Long-term monitoring EEG read showed cortical dysfunction likely secondary to underlying structural abnormality, postictal. No seizure captured.   - Appreciate further neurology recommendations - Continue IV Keppra 500 mg twice daily - Awaiting stat head CT, and follow-up LP if negative - Awaiting further AMS labs (in process): Thiamine, RPR, HIV - Await complete echocardiogram today - Speech/language/cognitive eval ordered, appreciate SLP recs - Seizure precautions - Delirium precautions - PT/OT - Continue Tylenol 1000 mg every 6 hours for now given difficulty asking for pain medications for headache -A.m. BMP Chronic health problem Polymyalgia rheumatica: Continue prednisone.  CRP/sed rate WNL Ductal carcinoma in situ: Continue tamoxifen IDA: Hgb 15.1, monitor clinically Hypertension: Discontinue hydrochlorothiazide for now  FEN/GI: NPO until stroke eval PPx: Lovenox Dispo: Pending  PT/OT  Subjective:   On interview, with son in room, patient is lying in hospital bed and appears asleep.  Wakes to voice.  Says "yes" multiple times to different questions.  Does not change this response.  Is not as talkative as yesterday.  When asked her name, says some neologisms and attempts to return to sleep.  Discussed with son the importance of regulating patient sleep-wake cycle where possible, opening blinds.  Per son, no significant new issues from last night, although she has been voicing left-sided headache.  Objective:  BP: 103/54 HR: 88 RR: 19 T: 98.0 O2sat: 90% on room air  Significant vitals over past 24 hours:   Physical Exam:  General: Sleepy-appearing female, NAD, briefly attentive Cardiovascular: RRR no m/r/g. Respiratory: CTAB. No w/r/r.  Abdomen: No tenderness in 4 quadrants. BS+.  Extremities: ROM intact.  Nonedematous. Neuro: Awakes to voice and light touch.  Answers yes/no questions inappropriately.  Some neologisms.  Appears sleepy.  No gaze preference noted.  Pupils equal.  Unable to participate in full neurologic interview.   Basic labs:  Most recent CBC Lab Results  Component Value Date   WBC 9.2 09/03/2023   HGB 15.3 (H) 09/03/2023   HCT 45.0 09/03/2023   MCV 86.0 09/03/2023   PLT 299 09/03/2023   Most recent BMP    Latest Ref Rng & Units 09/03/2023   10:48 AM  BMP  Glucose 70 - 99 mg/dL 130   BUN 8 - 23 mg/dL 13   Creatinine 8.65 - 1.00 mg/dL 7.84   Sodium 696 - 295 mmol/L 135   Potassium 3.5 - 5.1 mmol/L 3.9   Chloride 98 - 111 mmol/L 100     Other pertinent labs:   HIV: In process Lipid panel: Unremarkable A1c: 5.6%  CBC: WNL K 3.4  TSH: 0.615 Thiamine: In process Folate: >  40 B12: 488 RPR: In process  Imaging/Diagnostic Tests:  Continuous EEG (4/16) Left frontotemporal slowing with no seizures or epileptiform discharges.  Possible structural abnormality versus postictal state.  Temporal artery ultrasound Impression: No  significant stenosis or Halo seen.   Gwyndolyn Lerner, MD 09/04/2023, 7:31 AM  PGY-1, [facility] Family Medicine FPTS Intern pager: [phone], text pages welcome Secure chat group [facility] Teaching Service

## 2023-09-04 NOTE — Progress Notes (Signed)
 NEUROLOGY CONSULT FOLLOW UP NOTE   Date of service: September 04, 2023 Patient Name: Melissa Sosa MRN:  [id number] DOB:  09/06/1945  Interval Hx/subjective   Patient has been hemodynamically stable and afebrile overnight.  Her ESR and CRP are reassuring, as is her temporal artery ultrasound.  Her aphasia continues to fluctuate, with speech being mostly word salad on exam.    Vitals   Vitals:   09/04/23 0135 09/04/23 0140 09/04/23 0448 09/04/23 0717  BP:  (!) 140/55 (!) 113/58 (!) 103/54  Pulse:   87 88  Resp:  16 14 19   Temp:  99.1 F (37.3 C) 98.9 F (37.2 C) 98 F (36.7 C)  TempSrc:  Oral Oral Oral  SpO2:  97% 99% 98%  Weight: 93.9 kg     Height: 5\' 6"  (1.676 m)        Body mass index is 33.41 kg/m.  Physical Exam   Constitutional: Appears well-developed and well-nourished.  Psych: Affect appropriate to situation.  Eyes: No scleral injection.  HENT: No OP obstrucion.  Head: Normocephalic.  Respiratory: Effort normal, non-labored breathing.  Skin: WDI.   Neurologic Examination    NEURO:  Mental Status: Patient is drowsy but awakens to voice and light touch.  She responds to name and attempts to answer questions, but speech is largely word salad with many neologisms.  She is unable to name objects or repeat phrases.  She can intermittently follow simple, but not two step, commands and will mimic.   Cranial Nerves:  II: PERRL.  III, IV, VI: EOMI. Eyelids elevate symmetrically.  V: Sensation is intact to light touch and symmetrical to face.  VII: Smile is symmetrical.  VIII: hearing intact to voice. IX, X: Phonation is normal.  XII: Noncooperative with tongue protrusion Motor: Able to move all four extremities with symmetrical, antigravity strength Tone: is normal and bulk is normal Sensation- Intact to light touch bilaterally.  Coordination: Unable to perform Gait- deferred     Medications  Current Facility-Administered Medications:    acetaminophen  (TYLENOL) tablet 1,000 mg, 1,000 mg, Oral, Q6H, Mabe, Gerald, MD, 1,000 mg at 09/04/23 1324   cholecalciferol (VITAMIN D3) 25 MCG (1000 UNIT) tablet 2,000 Units, 2,000 Units, Oral, Daily, Tomie China, MD, 2,000 Units at 09/03/23 2248   enoxaparin (LOVENOX) injection 40 mg, 40 mg, Subcutaneous, Q24H, Tomie China, MD   hydrochlorothiazide (HYDRODIURIL) tablet 6.25 mg, 6.25 mg, Oral, Daily, Lum Babe, Kehinde T, MD, 6.25 mg at 09/03/23 2248   ibuprofen (ADVIL) tablet 400 mg, 400 mg, Oral, Once, Elberta Fortis, MD   levETIRAcetam (KEPPRA) IVPB 500 mg/100 mL premix, 500 mg, Intravenous, BID **OR** levETIRAcetam (KEPPRA) tablet 500 mg, 500 mg, Per Tube, BID, Tomie China, MD, 500 mg at 09/03/23 2248   lisinopril (ZESTRIL) tablet 5 mg, 5 mg, Oral, Daily, Lum Babe, Kehinde T, MD, 5 mg at 09/03/23 2249   nortriptyline (PAMELOR) capsule 25 mg, 25 mg, Oral, QHS, Tomie China, MD, 25 mg at 09/04/23 0213   ondansetron (ZOFRAN) tablet 4 mg, 4 mg, Oral, Q8H PRN, Tomie China, MD   Oral care mouth rinse, 15 mL, Mouth Rinse, PRN, Lum Babe, Kehinde T, MD   pantoprazole (PROTONIX) EC tablet 20 mg, 20 mg, Oral, Daily, Tomie China, MD   predniSONE (DELTASONE) tablet 5 mg, 5 mg, Oral, Daily, Tomie China, MD   sodium chloride flush (NS) 0.9 % injection 3 mL, 3 mL, Intravenous, Once, Soto, Johana, PA-C   tamoxifen (NOLVADEX) tablet 20 mg, 20 mg, Oral, QHS, Eniola, Kehinde T,  MD, 20 mg at 09/03/23 2317  Labs and Diagnostic Imaging   CBC:  Recent Labs  Lab 09/03/23 1042 09/03/23 1048  WBC 9.2  --   NEUTROABS 7.2  --   HGB 15.1* 15.3*  HCT 44.2 45.0  MCV 86.0  --   PLT 299  --     Basic Metabolic Panel:  Lab Results  Component Value Date   NA 135 09/03/2023   K 3.9 09/03/2023   CO2 22 09/03/2023   GLUCOSE 153 (H) 09/03/2023   BUN 13 09/03/2023   CREATININE 0.80 09/03/2023   CALCIUM 9.6 09/03/2023   GFRNONAA >60 09/03/2023   GFRAA >60 10/31/2019   Lipid Panel:  No results found for: "LDLCALC" HgbA1c: No results found for: "HGBA1C" Urine Drug Screen: No results found for: "LABOPIA", "COCAINSCRNUR", "LABBENZ", "AMPHETMU", "THCU", "LABBARB"  Alcohol Level     Component Value Date/Time   ETH <10 09/03/2023 1042   INR  Lab Results  Component Value Date   INR 1.0 09/03/2023   APTT  Lab Results  Component Value Date   APTT 31 09/03/2023   ESR: 9 CRP: 0.5 A1c 5.6 LDL 87 TSH 0.615 Thiamine pending B12 488 Folate >40 RPR pending  AED levels: No results found for: "PHENYTOIN", "ZONISAMIDE", "LAMOTRIGINE", "LEVETIRACETA"  CT Head without contrast(Personally reviewed): No acute abnormality, atrophy and chronic small vessel ischemic changes  CT angio Head and Neck with contrast(Personally reviewed): No LVO, 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    Assessment   Melissa Sosa is a 78 y.o. female with history of HTN, PMR on PRN steroids, migraines and depression who presented with fluctuating aphasia.  On exam today, her speech is largely word salad with frequent neologisms.  Her son, who is at the bedside, states that this fluctuates and that at times, she is able to communicate without aphasia and appears oriented to her situation.  She has continued to complain of a left temporal headache.  MRI brain reveals possible punctate infarct in left periatrial white matter, but this would not explain her symptoms.  She has been started on Keppra and placed on LTM EEG, awaiting today's reading.  ESR/CRP not consistent with systemic inflammation, and temporal artery [place] negative for halo sign. Initial elevated Hgb maybe secondary to dehydration given urine +Ketones, on today's CBC, HGB not  elevated, so initial result was likely due to dehydration.  LTM EEG demonstrates left frontotemporal slowing.  Given worsening of aphasia with no seizure activity on LTM, will repeat head CT and obtain LP if no new abnormalities seen.  Recommendations  - Discontinue LTM EEG - follow thiamine and RPR  - Continue Keppra 500 mg BID - repeat head CT given worsening of aphasia - LP under fluoroscopy with cell count in tubes 1 and 4, protein, glucose, gram stain, CSF culture and meningitis/encephalitis panel ______________________________________________________________________  Patient seen by NP and then by MD, MD to edit note as needed.  Cortney E Ernestina Columbia , MSN, AGACNP-BC [facility] See Amion for schedule and pager information 09/04/2023 9:28 AM   Attending Neurologist's note:  On my exam at approximately 10 AM patient was sleepy but awoke to repeated light stimulation.  She had a marked left gaze preference, could crossover towards the right with  extended encouragement.  Answering all questions yes.  No other verbal output.  Not following any commands, right hemianopia.  Markedly worse than my examination yesterday.  On review of SLP notes, does seem to have improved again at the time of their evaluation at 1142  Unclear etiology at this time, further workup planned as documented above -- will look for any sign of infectious/inflammatory process in CSF studies  On physical exam notably her lumbar spinous processes are very difficult to palpate and she does seem to have some element of scoliosis as well; appreciate assistance of fluoroscopy team for LP  I personally saw this patient, gathering history, performing a full neurologic examination, reviewing relevant labs, personally reviewing relevant imaging including MRI brain, EEG, and formulated the assessment and plan, adding the note above for completeness and clarity to accurately reflect my thoughts  Signed, Ronnette Coke, MD [facility] Available 7 AM to 7 PM, outside these hours please contact Neurologist on call listed on AMION

## 2023-09-04 NOTE — Progress Notes (Signed)
 OT Cancellation Note  Patient Details Name: Melissa Sosa MRN: [id number] DOB: 10/01/1945   Cancelled Treatment:    Reason Eval/Treat Not Completed: Other (comment) (Stat CT ordered. Scheduled for LP today. Will follow up on a later date.)  [facility] 09/04/2023, 11:39 AM Milburn Aliment, OT/L   Acute OT Clinical Specialist Acute Rehabilitation Services Pager [phone] Office [phone]

## 2023-09-04 NOTE — Plan of Care (Signed)
 Attempted to locate anatomical landmarks for bedside LP.  Landmarks difficult to palpate, and review of CT demonstrates significant soft tissue which would prevent successful bedside LP.  Will place order for LP under fluoroscopy.

## 2023-09-04 NOTE — Procedures (Addendum)
 Patient Name: Melissa Sosa  MRN: [id number]  Epilepsy Attending: Arleene Lack  Referring Physician/Provider: Ronnette Coke, MD  Duration: 09/03/2023 1334 to 09/04/2023 1057  Patient history: 78yo F with fluctuating aphasia. EEG to evaluate for seizure  Level of alertness: Awake, asleep  AEDs during EEG study: LEV  Technical aspects: This EEG study was done with scalp electrodes positioned according to the 10-20 International system of electrode placement. Electrical activity was reviewed with band pass filter of 1-70Hz , sensitivity of 7 uV/mm, display speed of 69mm/sec with a 60Hz  notched filter applied as appropriate. EEG data were recorded continuously and digitally stored.  Video monitoring was available and reviewed as appropriate.  Description: The posterior dominant rhythm consists of 8-9 Hz activity of moderate voltage (25-35 uV) seen predominantly in posterior head regions, symmetric and reactive to eye opening and eye closing. Sleep was characterized by vertex waves, sleep spindles (12 to 14 Hz), maximal frontocentral region. EEG showed near continuous 3 to 6 Hz theta-delta slowing in left fronto-temporal region. Hyperventilation and photic stimulation were not performed.     ABNORMALITY - Continuous slow, left fronto-temporal region  IMPRESSION: This study is suggestive of cortical dysfunction arising from left fronto-temporal region likely secondary to underlying structural abnormality, post-ictal state. No seizures or epileptiform discharges were seen throughout the recording.  Mayzie Caughlin O Elray Dains

## 2023-09-04 NOTE — Assessment & Plan Note (Addendum)
 Polymyalgia rheumatica: Continue prednisone.  CRP/sed rate WNL Ductal carcinoma in situ: Continue tamoxifen IDA: Hgb 15.1, monitor clinically Hypertension: Discontinue hydrochlorothiazide for now

## 2023-09-05 DIAGNOSIS — R4701 Aphasia: Secondary | ICD-10-CM | POA: Diagnosis not present

## 2023-09-05 DIAGNOSIS — E860 Dehydration: Secondary | ICD-10-CM | POA: Diagnosis not present

## 2023-09-05 DIAGNOSIS — R410 Disorientation, unspecified: Secondary | ICD-10-CM | POA: Diagnosis not present

## 2023-09-05 DIAGNOSIS — R519 Headache, unspecified: Secondary | ICD-10-CM | POA: Diagnosis not present

## 2023-09-05 LAB — BASIC METABOLIC PANEL WITH GFR
Anion gap: 9 (ref 5–15)
BUN: 15 mg/dL (ref 8–23)
CO2: 22 mmol/L (ref 22–32)
Calcium: 9.4 mg/dL (ref 8.9–10.3)
Chloride: 106 mmol/L (ref 98–111)
Creatinine, Ser: 0.88 mg/dL (ref 0.44–1.00)
GFR, Estimated: >60 mL/min (ref >60)
Glucose, Bld: 88 mg/dL (ref 70–99)
Potassium: 3.7 mmol/L (ref 3.5–5.1)
Sodium: 137 mmol/L (ref 135–145)

## 2023-09-05 LAB — MISC LABCORP TEST (SEND OUT): Labcorp test code: 83935

## 2023-09-05 LAB — MAGNESIUM: Magnesium: 1.9 mg/dL (ref 1.7–2.4)

## 2023-09-05 LAB — RPR: RPR Ser Ql: NONREACTIVE

## 2023-09-05 MED ORDER — LEVETIRACETAM 750 MG PO TABS
750.0000 mg | ORAL_TABLET | Freq: Two times a day (BID) | ORAL | Status: DC
  Administered 2023-09-05 – 2023-09-07 (×4): 750 mg via ORAL
  Filled 2023-09-05 (×4): qty 1

## 2023-09-05 MED ORDER — THIAMINE MONONITRATE 100 MG PO TABS
500.0000 mg | ORAL_TABLET | Freq: Every day | ORAL | Status: DC
  Administered 2023-09-05 – 2023-09-06 (×2): 500 mg via ORAL
  Filled 2023-09-05 (×2): qty 5

## 2023-09-05 MED ORDER — SODIUM CHLORIDE 0.9 % IV SOLN
750.0000 mg | Freq: Two times a day (BID) | INTRAVENOUS | Status: DC
  Filled 2023-09-05 (×6): qty 7.5

## 2023-09-05 MED ORDER — LEVETIRACETAM 250 MG PO TABS
250.0000 mg | ORAL_TABLET | Freq: Once | ORAL | Status: AC
  Administered 2023-09-05: 250 mg via ORAL
  Filled 2023-09-05: qty 1

## 2023-09-05 MED ORDER — VALPROATE SODIUM 100 MG/ML IV SOLN
1500.0000 mg | Freq: Once | INTRAVENOUS | Status: AC
  Administered 2023-09-05: 1500 mg via INTRAVENOUS
  Filled 2023-09-05: qty 15

## 2023-09-05 NOTE — Progress Notes (Signed)
 NEUROLOGY CONSULT FOLLOW UP NOTE   Date of service: September 05, 2023 Patient Name: Melissa Sosa MRN:  [id number] DOB:  Jun 24, 1945  Interval Hx/subjective   Patient has been hemodynamically stable and afebrile overnight.  She continues to have a fluctuating, fluent receptive and expressive aphasia with some intact social speech.  Family states that at times she is almost able to converse normally and at other times her speech is word salad.  Vitals   Vitals:   09/04/23 1923 09/04/23 2334 09/05/23 0416 09/05/23 0757  BP: (!) 141/70 (!) 134/51 120/77 (!) 141/73  Pulse: 90 93 88 89  Resp:   17 19  Temp: 98.7 F (37.1 C) 98.9 F (37.2 C) 98.4 F (36.9 C) 98.8 F (37.1 C)  TempSrc: Oral Oral Oral Oral  SpO2: 94% 94% 97% 96%  Weight:      Height:       Current vital signs: BP (!) 141/73 (BP Location: Left Arm)   Pulse 89   Temp 98.8 F (37.1 C) (Oral)   Resp 19   Ht 5\' 6"  (1.676 m)   Wt 93.9 kg   SpO2 96%   BMI 33.41 kg/m  Vital signs in last 24 hours: Temp:  [98.4 F (36.9 C)-98.9 F (37.2 C)] 98.8 F (37.1 C) (04/17 0757) Pulse Rate:  [83-93] 89 (04/17 0757) Resp:  [17-19] 19 (04/17 0757) BP: (120-141)/(51-77) 141/73 (04/17 0757) SpO2:  [94 %-97 %] 96 % (04/17 0757)    Body mass index is 33.41 kg/m.  Physical Exam   Constitutional: Appears well-developed and well-nourished.  Psych: Affect appropriate to situation.  Eyes: No scleral injection.  HENT: No OP obstrucion.  Head: Normocephalic.  Respiratory: Effort normal, non-labored breathing.  Skin: WDI.   Neurologic Examination    NEURO:  Mental Status: Patient is alert and responds to name but is unable to follow commands, name objects or repeat phrases.  Social speech is intact, and patient can say things like "I think so" and "yes".  Patient has clear receptive and expressive aphasia.  Per family, speech fluctuates, with speech being near normal and conversant at times and at other times being largely word  salad.   Cranial Nerves:  II: PERRL.  III, IV, VI: Eyes midline, difficulty getting patient to look to the left, unclear if this is gaze preference or simply difficulty following commands VII: Smile is symmetrical.  VIII: hearing intact to voice. IX, X: Phonation is normal.  XII: Noncooperative with tongue protrusion Motor: Able to move all four extremities with symmetrical, antigravity strength Tone: is normal and bulk is normal Sensation- Intact to light touch bilaterally.  Coordination: Unable to perform Gait- deferred     Medications  Current Facility-Administered Medications:    acetaminophen (TYLENOL) tablet 1,000 mg, 1,000 mg, Oral, Q6H, Mabe, Gerald, MD, 1,000 mg at 09/05/23 8469   cholecalciferol (VITAMIN D3) 25 MCG (1000 UNIT) tablet 2,000 Units, 2,000 Units, Oral, Daily, Gwyndolyn Lerner, MD, 2,000 Units at 09/04/23 1211   enoxaparin (LOVENOX) injection 40 mg, 40 mg, Subcutaneous, Q24H, de Thayne Fine, [place] E, NP, 40 mg at 09/04/23 2054   levETIRAcetam (KEPPRA) IVPB 500 mg/100 mL premix, 500 mg, Intravenous, BID **OR** levETIRAcetam (KEPPRA) tablet 500 mg, 500 mg, Oral, BID, McDiarmid, Demetra Filter, MD, 500 mg at 09/04/23 2242   lisinopril (ZESTRIL) tablet 5 mg, 5 mg, Oral, Daily, Penni Bowman T, MD, 5 mg at 09/04/23 1211   nortriptyline (PAMELOR) capsule 25 mg, 25 mg, Oral, QHS, Gwyndolyn Lerner, MD, 25  mg at 09/04/23 2242   ondansetron (ZOFRAN) tablet 4 mg, 4 mg, Oral, Q8H PRN, Tomie China, MD   Oral care mouth rinse, 15 mL, Mouth Rinse, PRN, Lum Babe, Theador Hawthorne, MD   pantoprazole (PROTONIX) EC tablet 20 mg, 20 mg, Oral, Daily, Tomie China, MD, 20 mg at 09/04/23 1211   predniSONE (DELTASONE) tablet 5 mg, 5 mg, Oral, Daily, Tomie China, MD, 5 mg at 09/04/23 1211   sodium chloride flush (NS) 0.9 % injection 3 mL, 3 mL, Intravenous, Once, Soto, Johana, PA-C   tamoxifen (NOLVADEX) tablet 20 mg, 20 mg, Oral, QHS, Eniola, Kehinde T, MD, 20 mg at 09/04/23  2242   thiamine (VITAMIN B1) tablet 500 mg, 500 mg, Oral, Daily, de Verdis Prime, NP  Labs and Diagnostic Imaging   CBC:  Recent Labs  Lab 09/03/23 1042 09/03/23 1048 09/04/23 0615  WBC 9.2  --  7.3  NEUTROABS 7.2  --   --   HGB 15.1* 15.3* 13.8  HCT 44.2 45.0 40.6  MCV 86.0  --  85.3  PLT 299  --  251    Basic Metabolic Panel:  Lab Results  Component Value Date   NA 137 09/05/2023   K 3.7 09/05/2023   CO2 22 09/05/2023   GLUCOSE 88 09/05/2023   BUN 15 09/05/2023   CREATININE 0.88 09/05/2023   CALCIUM 9.4 09/05/2023   GFRNONAA >60 09/05/2023   GFRAA >60 10/31/2019   Lipid Panel:  Lab Results  Component Value Date   LDLCALC 87 09/04/2023   HgbA1c:  Lab Results  Component Value Date   HGBA1C 5.6 09/04/2023   Alcohol Level     Component Value Date/Time   ETH <10 09/03/2023 1042   INR  Lab Results  Component Value Date   INR 1.0 09/03/2023   APTT  Lab Results  Component Value Date   APTT 31 09/03/2023   ESR: 9 CRP: 0.5 A1c 5.6 LDL 87 TSH 0.615 Thiamine pending B12 488 Folate >40 RPR nonreactive Meningitis encephalitis panel negative CSF RBC 24 and 2 1, WBC 4 CSF RBC 3 and WBC 2 and tube 4 CSF culture with Gram stain no WBC or organisms seen, culture pending CSF protein 30 CSF glucose 66  CT Head without contrast(Personally reviewed) -- both initial head CT as well as repeat head CT 4/16: No acute abnormality, atrophy and chronic small vessel ischemic changes  CT angio Head and Neck with contrast(Personally reviewed): No LVO, 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    Assessment   Melissa Sosa is a 78 y.o. female with history of HTN, PMR on PRN steroids, migraines and  depression who presented with fluctuating aphasia.  On exam today, her speech is largely word salad with frequent neologisms.  Her son, who is at the bedside, states that this fluctuates and that at times, she is able to communicate without aphasia and appears oriented to her situation.  She has continued to complain of a left temporal headache.  MRI brain reveals possible punctate infarct in left periatrial white matter, but this would not explain her symptoms.  She has been started on Keppra, and LTM EEG demonstrated left frontal slowing but no overt seizure activity.  ESR/CRP not consistent with systemic inflammation, and temporal artery  US  negative for halo sign. Initial elevated Hgb maybe secondary to dehydration given urine +Ketones, on today's CBC, HGB not elevated, so initial result was likely due to dehydration.  LTM EEG demonstrates left frontotemporal slowing.  Given worsening of aphasia, repeat head CT was obtained which demonstrated no new acute abnormalities.  Lumbar puncture results are reassuring so far.  However, patient continues to have fluent receptive and expressive aphasia --suspect she is having small focal seizures too subtle to capture on surface EEG  Recommendations  - follow thiamine and RPR  - Increase to Keppra 750 mg BID; give additional 250 mg one-time this afternoon - One-time loading dose of Depakote 1500 mg tonight at 8 PM - Neurology will follow along ______________________________________________________________________  Patient seen by NP and then by MD, MD to edit note as needed.  Cortney E Bucky Cardinal , MSN, AGACNP-BC [facility] See Amion for schedule and pager information 09/05/2023 9:33 AM     Attending Neurologist's note:  On my exam continues to have fluctuating aphasia, better able to follow commands today.  No gaze preference today.  No hemianopia today.  Using both upper extremities equally and both lower extremities  equally.  Workup is reassuring against a primary structural etiology or inflammatory/infectious process  Will treat as simple focal seizures, recommendations edited as needed above  I personally saw this patient, gathering history, performing a full neurologic examination, reviewing relevant labs, personally reviewing relevant imaging including head CT, and formulated the assessment and plan, adding the note above for completeness and clarity to accurately reflect my thoughts  Baldwin Levee MD-PhD [facility] [phone]

## 2023-09-05 NOTE — Progress Notes (Signed)
 Inpatient Rehab Admissions Coordinator Note:   Per therapy recommendations patient was screened for CIR candidacy by Mickey Alar, PT. At this time, pt appears to be a potential candidate for CIR. I will place an order for rehab consult for full assessment, per our protocol.  Please contact me any with questions.Loye Rumble, PT, DPT [phone] 09/05/23 4:41 PM

## 2023-09-05 NOTE — Evaluation (Signed)
 Occupational Therapy Evaluation Patient Details Name: Melissa Sosa MRN: [id number] DOB: [birth year] Today's Date: 09/05/2023   History of Present Illness   Pt is a 78 yo female admitted to [facility] on 09/03/23 with new onset of aphasia. 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 PMH of HTN, OA, lymphoma, anxiety     Clinical Impressions Pt admitted for above, PTA pt was living alone and ind with ADLs/iADLs. Pt currently presenting with impaired balance, processing and speech deficits which impact her ADL performance. Pt needing mod A with cues to process which objects she has and what functional tasks the objects are used to complete. Pt unable to process any higher level executive tasks. Pt needing Mod A to CGA for ADLs and CGA + RW for mobility. OT to continue following pt acutely to address listed deficits and help progress pt as able. Patient has the potential to reach Mod I and demos the ability to tolerate 3 hours of therapy. Pt would benefit from an intensive rehab program to help maximize functional independence if family can establish caregiver support.      If plan is discharge home, recommend the following:   A little help with walking and/or transfers;A little help with bathing/dressing/bathroom;Supervision due to cognitive status;Direct supervision/assist for financial management;Direct supervision/assist for medications management     Functional Status Assessment   Patient has had a recent decline in their functional status and demonstrates the ability to make significant improvements in function in a reasonable and predictable amount of time.     Equipment Recommendations   None recommended by OT     Recommendations for Other Services   Rehab consult     Precautions/Restrictions   Precautions Precautions: Fall Recall of Precautions/Restrictions: Impaired Restrictions Weight Bearing Restrictions Per Provider Order: No     Mobility  Bed Mobility Overal bed mobility: Needs Assistance Bed Mobility: Supine to Sit, Sit to Supine     Supine to sit: Min assist     General bed mobility comments: Assistance with BLE management. Max multimodal cues for sequencing.    Transfers Overall transfer level: Needs assistance Equipment used: Rolling walker (2 wheels) Transfers: Sit to/from Stand Sit to Stand: Min assist           General transfer comment: Cues for hand placement      Balance Overall balance assessment: Needs assistance Sitting-balance support: Bilateral upper extremity supported, Feet supported Sitting balance-Leahy Scale: Good     Standing balance support: Bilateral upper extremity supported, During functional activity Standing balance-Leahy Scale: Fair Standing balance comment: reliant on RW however was able to brush teeth at sink.                           ADL either performed or assessed with clinical judgement   ADL Overall ADL's : Needs assistance/impaired Eating/Feeding: Sitting;Supervision/ safety;Set up   Grooming: Moderate assistance;Standing;Oral care Grooming Details (indicate cue type and reason): assist to get everything setup and to demonstrate the use of toothbrush Upper Body Bathing: Sitting;Minimal assistance   Lower Body Bathing: Sitting/lateral leans;Moderate assistance   Upper Body Dressing : Sitting;Minimal assistance   Lower Body Dressing: Sit to/from stand;Moderate assistance   Toilet Transfer: Contact guard assist;Rolling walker (2 wheels);Ambulation   Toileting- Clothing Manipulation and Hygiene: Sitting/lateral lean;Contact guard assist       Functional mobility during ADLs: Contact guard assist;Rolling walker (2 wheels) General ADL Comments: Use of chaining techniques and visual cues  to help pt follow commands     Vision   Additional Comments: Pt tracking without challenge, unable to follow full commands for further visual testing.      Perception         Praxis Praxis: Impaired Praxis Impairment Details: Ideation     Pertinent Vitals/Pain Pain Assessment Pain Assessment: No/denies pain     Extremity/Trunk Assessment Upper Extremity Assessment Upper Extremity Assessment: Overall WFL for tasks assessed   Lower Extremity Assessment Lower Extremity Assessment: Overall WFL for tasks assessed   Cervical / Trunk Assessment Cervical / Trunk Assessment: Kyphotic   Communication Communication Communication: Impaired Factors Affecting Communication: Reduced clarity of speech;Difficulty expressing self   Cognition Arousal: Alert Behavior During Therapy: WFL for tasks assessed/performed Cognition: Difficult to assess (expressive and receptive aphasia.) Difficult to assess due to: Impaired communication           OT - Cognition Comments: Pt unable to name objects for ADLs or demonstrate their use without cueing. word perseveration and unable to write name                 Following commands: Impaired Following commands impaired: Follows one step commands inconsistently, Follows one step commands with increased time     Cueing  General Comments   Cueing Techniques: Verbal cues;Gestural cues;Tactile cues;Visual cues  VSS   Exercises     Shoulder Instructions      Home Living Family/patient expects to be discharged to:: Private residence Living Arrangements: Alone Available Help at Discharge: Family;Available PRN/intermittently;Friend(s);Neighbor Type of Home: Other(Comment) (Condo) Home Access: Stairs to enter [employer] of Steps: 1 Entrance Stairs-Rails: None Home Layout: One level     Bathroom Shower/Tub: Producer, television/film/video: Handicapped height Bathroom Accessibility: Yes   Home Equipment: Grab bars - tub/shower;Shower seat - built in;Cane - quad;Hand held Public house manager      Lives With: Alone    Prior Functioning/Environment Prior Level of Function :  Independent/Modified Independent;Driving             Mobility Comments: ind no AD ADLs Comments: Ind    OT Problem List: Decreased cognition;Obesity;Impaired balance (sitting and/or standing)   OT Treatment/Interventions: Self-care/ADL training;Balance training;Therapeutic activities;Cognitive remediation/compensation;Patient/family education      OT Goals(Current goals can be found in the care plan section)   Acute Rehab OT Goals Patient Stated Goal: To get better OT Goal Formulation: With patient/family Time For Goal Achievement: 09/19/23 Potential to Achieve Goals: Good ADL Goals Pt Will Perform Grooming: with supervision;with set-up;standing Pt Will Perform Lower Body Bathing: sitting/lateral leans;with set-up Pt Will Perform Lower Body Dressing: sit to/from stand;with set-up Pt Will Transfer to Toilet: with supervision;ambulating Additional ADL Goal #2: Pt will appropriately identify 3/3 objects for functional tasks and demonstrate proper use of them   OT Frequency:  Min 2X/week    Co-evaluation PT/OT/SLP Co-Evaluation/Treatment: Yes     OT goals addressed during session: ADL's and self-care;Strengthening/ROM      AM-PAC OT "6 Clicks" Daily Activity     Outcome Measure Help from another person eating meals?: A Little Help from another person taking care of personal grooming?: A Little Help from another person toileting, which includes using toliet, bedpan, or urinal?: A Little Help from another person bathing (including washing, rinsing, drying)?: A Lot Help from another person to put on and taking off regular upper body clothing?: A Little Help from another person to put on and taking off regular lower body clothing?: A Lot 6 Click  Score: 16   End of Session Equipment Utilized During Treatment: Gait belt;Rolling walker (2 wheels) Nurse Communication: Mobility status  Activity Tolerance: Patient tolerated treatment well Patient left: in bed;with call  bell/phone within reach;with bed alarm set;with family/visitor present  OT Visit Diagnosis: Other symptoms and signs involving cognitive function;Cognitive communication deficit (R41.841);Unsteadiness on feet (R26.81) Symptoms and signs involving cognitive functions: Cerebral infarction (possible infarct)                Time: 4098-1191 OT Time Calculation (min): 30 min Charges:  OT General Charges $OT Visit: 1 Visit OT Evaluation $OT Eval Moderate Complexity: 1 Mod  09/05/2023  AB, OTR/L  Acute Rehabilitation Services  Office: [phone]   Jorene New 09/05/2023, 5:02 PM

## 2023-09-05 NOTE — TOC Initial Note (Signed)
 Transition of Care [facility]) - Initial/Assessment Note    Patient Details  Name: Melissa Sosa MRN: [id number] Date of Birth: 31-Jan-1946  [facility] CM/SW Contact:    Jonathan Neighbor, RN Phone Number: 09/05/2023, 11:10 AM  Clinical Narrative:                  Pt with aphasia. Daughter at the bedside and she answered the questions. Daughter is from out of town. Son lives local but daughter not sure of his ability to provide assistance after d/c.  Current recommendations are for CIR per ST. Awaiting PT/OT evals. Pt was IADL: drove, managed her own medications. TOC following.  Expected Discharge Plan: IP Rehab Facility Barriers to Discharge: Continued Medical Work up   Patient Goals and CMS Choice   CMS Medicare.gov Compare Post Acute Care list provided to:: Patient Represenative (must comment) Choice offered to / list presented to : Adult Children      Expected Discharge Plan and Services   Discharge Planning Services: CM Consult Post Acute Care Choice: IP Rehab Living arrangements for the past 2 months: Single Family Home                                      Prior Living Arrangements/Services Living arrangements for the past 2 months: Single Family Home Lives with:: Self Patient language and need for interpreter reviewed:: Yes          Care giver support system in place?: No (comment) Current home services: DME (cane/ built in shower seat/ barss) Criminal Activity/Legal Involvement Pertinent to Current Situation/Hospitalization: No - Comment as needed  Activities of Daily Living      Permission Sought/Granted                  Emotional Assessment Appearance:: Appears stated age         Psych Involvement: No (comment)  Admission diagnosis:  Aphasia [R47.01] Stroke-like symptoms [R29.90] Patient Active Problem List   Diagnosis Date Noted   Stroke-like symptoms 09/03/2023   Chronic health problem 09/03/2023   Aphasia 09/03/2023    PCP:  Renda Carpen, MD Pharmacy:   [facility] [address] [place] - [address] [place] [address] Phone: [phone] Fax: [phone]     Social Drivers of Health (SDOH) Social History: SDOH Screenings   Food Insecurity: No Food Insecurity (09/04/2023)  Housing: Low Risk  (09/04/2023)  Transportation Needs: Patient Unable To Answer (09/04/2023)  Utilities: Patient Unable To Answer (09/04/2023)  Social Connections: Patient Unable To Answer (09/04/2023)  Tobacco Use: Low Risk  (09/03/2023)   SDOH Interventions:     Readmission Risk Interventions     No data to display

## 2023-09-05 NOTE — Plan of Care (Signed)
  Problem: Education: Goal: Knowledge of General Education information will improve Description: Including pain rating scale, medication(s)/side effects and non-pharmacologic comfort measures Outcome: Progressing   Problem: Health Behavior/Discharge Planning: Goal: Ability to manage health-related needs will improve Outcome: Progressing   Problem: Clinical Measurements: Goal: Ability to maintain clinical measurements within normal limits will improve Outcome: Progressing Goal: Will remain free from infection Outcome: Progressing   Problem: Coping: Goal: Level of anxiety will decrease Outcome: Progressing   Problem: Elimination: Goal: Will not experience complications related to bowel motility Outcome: Progressing Goal: Will not experience complications related to urinary retention Outcome: Progressing   Problem: Pain Managment: Goal: General experience of comfort will improve and/or be controlled Outcome: Progressing   Problem: Safety: Goal: Ability to remain free from injury will improve Outcome: Progressing   Problem: Skin Integrity: Goal: Risk for impaired skin integrity will decrease Outcome: Progressing

## 2023-09-05 NOTE — Care Management Important Message (Signed)
 Important Message  Patient Details  Name: Melissa Sosa MRN: [id number] Date of Birth: [birth year]   Important Message Given:  Yes - Medicare IM     Wynonia Hedges 09/05/2023, 4:25 PM

## 2023-09-05 NOTE — Evaluation (Signed)
 Physical Therapy Evaluation Patient Details Name: Ashlan Dignan MRN: [id number] DOB: [birth year] Today's Date: 09/05/2023  History of Present Illness  Pt is a 78 yo female admitted to [facility] on 09/03/23 with new onset of aphasia. 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 PMH of HTN, OA, lymphoma, anxiety  Clinical Impression  Pt presents with admitting diagnosis above. Co-treat with OT. Pt today was able to ambulate in hallway with RW CGA however required Min A for bed mobility and transfers. Pt main limitation currently is dense receptive and expressive aphasia. PTA pt daughter reports that pt lived by herself and was fully independent. Patient will benefit from intensive inpatient follow-up therapy, >3 hours/day. PT will continue to follow.         If plan is discharge home, recommend the following: A little help with walking and/or transfers;A little help with bathing/dressing/bathroom;Assistance with cooking/housework;Assistance with feeding;Assist for transportation;Direct supervision/assist for financial management;Direct supervision/assist for medications management;Help with stairs or ramp for entrance;Supervision due to cognitive status   Can travel by private vehicle        Equipment Recommendations Rolling walker (2 wheels)  Recommendations for Other Services  Rehab consult    Functional Status Assessment Patient has had a recent decline in their functional status and demonstrates the ability to make significant improvements in function in a reasonable and predictable amount of time.     Precautions / Restrictions Precautions Precautions: Fall Recall of Precautions/Restrictions: Impaired Restrictions Weight Bearing Restrictions Per Provider Order: No      Mobility  Bed Mobility Overal bed mobility: Needs Assistance Bed Mobility: Supine to Sit, Sit to Supine     Supine to sit: Min assist Sit to supine: Min assist   General bed mobility  comments: Assistance with BLE management. Max multimodal cues for sequencing.    Transfers Overall transfer level: Needs assistance Equipment used: Rolling walker (2 wheels) Transfers: Sit to/from Stand Sit to Stand: Min assist           General transfer comment: Cues for hand placement    Ambulation/Gait Ambulation/Gait assistance: Contact guard assist Gait Distance (Feet): 100 Feet Assistive device: Rolling walker (2 wheels) Gait Pattern/deviations: Trunk flexed, Decreased stride length, Step-through pattern Gait velocity: decreased     General Gait Details: 1 standing rest break. no LOB noted. Cues to follow OT due to aphasia. Pt unable to dual task.  Stairs            Wheelchair Mobility     Tilt Bed    Modified Rankin (Stroke Patients Only)       Balance Overall balance assessment: Needs assistance Sitting-balance support: Bilateral upper extremity supported, Feet supported Sitting balance-Leahy Scale: Good     Standing balance support: Bilateral upper extremity supported, During functional activity Standing balance-Leahy Scale: Fair Standing balance comment: reliant on RW however was able to brush teeth at sink.                             Pertinent Vitals/Pain Pain Assessment Pain Assessment: No/denies pain    Home Living Family/patient expects to be discharged to:: Private residence Living Arrangements: Alone Available Help at Discharge: Family;Available PRN/intermittently;Friend(s);Neighbor Type of Home: Other(Comment) (Condo) Home Access: Stairs to enter Entrance Stairs-Rails: None Entrance Stairs-Number of Steps: 1   Home Layout: One level Home Equipment: Grab bars - tub/shower;Shower seat - built in;Cane - quad;Hand held shower head      Prior Function  Prior Level of Function : Independent/Modified Independent;Driving             Mobility Comments: ind no AD ADLs Comments: Ind     Extremity/Trunk Assessment    Upper Extremity Assessment Upper Extremity Assessment: Defer to OT evaluation    Lower Extremity Assessment Lower Extremity Assessment: Overall WFL for tasks assessed    Cervical / Trunk Assessment Cervical / Trunk Assessment: Kyphotic  Communication   Communication Communication: Impaired Factors Affecting Communication: Reduced clarity of speech;Difficulty expressing self    Cognition Arousal: Alert Behavior During Therapy: WFL for tasks assessed/performed   PT - Cognitive impairments: Sequencing, Problem solving, Safety/Judgement, Attention                       PT - Cognition Comments: Very dense aphasia with very slow processing and perseveration at times. Following commands: Impaired Following commands impaired: Follows one step commands inconsistently, Follows one step commands with increased time     Cueing Cueing Techniques: Verbal cues, Gestural cues, Tactile cues, Visual cues     General Comments General comments (skin integrity, edema, etc.): VSS    Exercises     Assessment/Plan    PT Assessment Patient needs continued PT services  PT Problem List Decreased strength;Decreased range of motion;Decreased activity tolerance;Decreased balance;Decreased mobility;Decreased coordination;Decreased cognition;Decreased knowledge of use of DME;Decreased safety awareness;Decreased knowledge of precautions;Cardiopulmonary status limiting activity       PT Treatment Interventions DME instruction;Gait training;Stair training;Functional mobility training;Therapeutic activities;Balance training;Therapeutic exercise;Neuromuscular re-education;Patient/family education;Cognitive remediation    PT Goals (Current goals can be found in the Care Plan section)  Acute Rehab PT Goals PT Goal Formulation: Patient unable to participate in goal setting Time For Goal Achievement: 09/19/23 Potential to Achieve Goals: Good    Frequency Min 3X/week     Co-evaluation                AM-PAC PT "6 Clicks" Mobility  Outcome Measure Help needed turning from your back to your side while in a flat bed without using bedrails?: A Little Help needed moving from lying on your back to sitting on the side of a flat bed without using bedrails?: A Little Help needed moving to and from a bed to a chair (including a wheelchair)?: A Little Help needed standing up from a chair using your arms (e.g., wheelchair or bedside chair)?: A Little Help needed to walk in hospital room?: A Little Help needed climbing 3-5 steps with a railing? : A Lot 6 Click Score: 17    End of Session Equipment Utilized During Treatment: Gait belt Activity Tolerance: Patient tolerated treatment well Patient left: in bed;with call bell/phone within reach;with bed alarm set;with family/visitor present Nurse Communication: Mobility status PT Visit Diagnosis: Other abnormalities of gait and mobility (R26.89)    Time: 1610-9604 PT Time Calculation (min) (ACUTE ONLY): 33 min   Charges:   PT Evaluation $PT Eval Moderate Complexity: 1 Mod   PT General Charges $$ ACUTE PT VISIT: 1 Visit         Rodgers Clack, PT, DPT Acute Rehab Services [phone]   Asaf Elmquist 09/05/2023, 4:21 PM

## 2023-09-05 NOTE — Progress Notes (Addendum)
 Daily Progress Note Intern Pager: [phone]  Patient name: Melissa Sosa Medical record number: [id number] Date of birth: 05/27/1945 Age: 78 y.o. 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 STAT workup yesterday (head CT, LP) negative thus far. Echo unremarkable. AMS labs pan-negative (still awaiting thiamine).  Appears less responsive today.  Per son, in room, patient goes "in and out", but is confused when awoken for medications last night.  Per neurology message, believe patient may be having focal seizures not captured by EEG. Appreciate further neurology recs. Assessment & Plan Stroke-like symptoms Likeliest etiology remains seizure.  STAT head CT negative yesterday. LP unremarkable thus far. HIV/RPR negative. Speech pathology recommends >3 hours inpatient rehab.  Patient was not able to follow verbal commands, which represents a decline in function from yesterday morning.  Suspect patient is beginning to develop delirium. Neurology believes may be focal seizure, med changes as below. - Appreciate further neurology recommendations - Increase IV Keppra 500 mg --> 750 mg twice daily, per neuro recommendations  - Start IV valproic acid 1500 mg x1 @2000  per neuro recs - Start PO thiamine - Continue Tylenol 1000 mg every 6 hours for now given difficulty asking for pain medications for headache - Awaiting further AMS labs (in process): Thiamine, CSF studies - Seizure precautions - Delirium precautions - PT/OT -A.m. BMP Chronic health problem Polymyalgia rheumatica: Continue prednisone.  CRP/sed rate WNL Ductal carcinoma in situ: Continue tamoxifen IDA: Hgb 15.1, monitor clinically Hypertension: Discontinue hydrochlorothiazide for now  FEN/GI:  Full PPx: Lovenox Dispo: SLP: >3 hours inpatient rehab.  Subjective:   On my interview ~ 9 AM, patient is laying comfortably in bed.  However, she is much less verbal and unable to understand simple questions.  Cannot follow verbal commands.  No acute distress.  Repeats "what's that?"  at various points.  On discussion with son, in room, patient mental status appears to be fluctuating.  Was confused last night when administering p.m. medications.  Has not been eating very much.  Objective:  BP: 120/77 HR: 88 RR: 17 T: 98.4 O2sat: 97% on RA  Significant vitals over past 24 hours:   Physical Exam:  General: Well-appearing older female, NAD, appears more confused. Cardiovascular: RRR no m/r/g. Respiratory: CTAB. No w/r/r. Abdomen: No tenderness in 4 quadrants. BS+.  Extremities: ROM intact.  Nonedematous. Neuro: Less talkative today.  No neologisms noted.  Mainly stares at questioning.  Appears confused when asked if she would like to eat.  Basic labs:  Most recent CBC Lab Results  Component Value Date   WBC 7.3 09/04/2023   HGB 13.8 09/04/2023   HCT 40.6 09/04/2023   MCV 85.3 09/04/2023   PLT 251 09/04/2023   Most recent BMP    Latest Ref Rng & Units 09/05/2023    5:04 AM  BMP  Glucose 70 - 99 mg/dL 88   BUN 8 - 23 mg/dL 15   Creatinine 3.08 - 1.00 mg/dL 6.57   Sodium 846 - 962 mmol/L 137   Potassium 3.5 - 5.1 mmol/L 3.7   Chloride 98 - 111 mmol/L 106   CO2 22 - 32 mmol/L 22   Calcium 8.9 - 10.3 mg/dL 9.4     Other pertinent labs:  CSF labs: Glucose  66 WNL Total Protein 30 WNL CSF culture w/ gram stain: No WBC, no organisms, culture pending Cell count: bland Meningitis/encephalitis panel: pan-negative  Remaining AMS labs: RPR: negative Thiamine: in process  Imaging/Diagnostic Tests:  Stat head CT WO (4/16):  IMPRESSION: Normal head CT.  Echocardiogram (4/16)  FINDINGS   Left Ventricle: Left ventricular ejection fraction, by estimation, is 55  to  60%. The left ventricle has normal function. The left ventricle has no  regional wall motion abnormalities. The left ventricular internal cavity  size was normal in size. There is   mild concentric left ventricular hypertrophy. Left ventricular diastolic  parameters are consistent with Grade II diastolic dysfunction  (pseudonormalization).   Gwyndolyn Lerner, MD 09/05/2023, 7:47 AM  PGY-1, [facility] FPTS Intern pager: [phone], text pages welcome Secure chat group [facility] Teaching Service

## 2023-09-05 NOTE — Assessment & Plan Note (Addendum)
 Likeliest etiology remains seizure.  STAT head CT negative yesterday. LP unremarkable thus far. HIV/RPR negative. Speech pathology recommends >3 hours inpatient rehab.  Patient was not able to follow verbal commands, which represents a decline in function from yesterday morning.  Suspect patient is beginning to develop delirium. Neurology believes may be focal seizure, med changes as below. - Appreciate further neurology recommendations - Increase IV Keppra 500 mg --> 750 mg twice daily, per neuro recommendations  - Start IV valproic acid 1500 mg x1 @2000  per neuro recs - Start PO thiamine - Continue Tylenol 1000 mg every 6 hours for now given difficulty asking for pain medications for headache - Awaiting further AMS labs (in process): Thiamine, CSF studies - Seizure precautions - Delirium precautions - PT/OT -A.m. BMP

## 2023-09-05 NOTE — Assessment & Plan Note (Signed)
 Polymyalgia rheumatica: Continue prednisone.  CRP/sed rate WNL Ductal carcinoma in situ: Continue tamoxifen IDA: Hgb 15.1, monitor clinically Hypertension: Discontinue hydrochlorothiazide for now

## 2023-09-06 DIAGNOSIS — D0511 Intraductal carcinoma in situ of right breast: Secondary | ICD-10-CM | POA: Diagnosis not present

## 2023-09-06 DIAGNOSIS — R569 Unspecified convulsions: Secondary | ICD-10-CM

## 2023-09-06 DIAGNOSIS — I10 Essential (primary) hypertension: Secondary | ICD-10-CM

## 2023-09-06 DIAGNOSIS — M1712 Unilateral primary osteoarthritis, left knee: Secondary | ICD-10-CM

## 2023-09-06 DIAGNOSIS — C801 Malignant (primary) neoplasm, unspecified: Secondary | ICD-10-CM

## 2023-09-06 DIAGNOSIS — R4701 Aphasia: Secondary | ICD-10-CM | POA: Diagnosis not present

## 2023-09-06 DIAGNOSIS — G40909 Epilepsy, unspecified, not intractable, without status epilepticus: Secondary | ICD-10-CM

## 2023-09-06 DIAGNOSIS — D0512 Intraductal carcinoma in situ of left breast: Secondary | ICD-10-CM

## 2023-09-06 DIAGNOSIS — M353 Polymyalgia rheumatica: Secondary | ICD-10-CM

## 2023-09-06 LAB — BASIC METABOLIC PANEL WITH GFR
Anion gap: 8 (ref 5–15)
BUN: 16 mg/dL (ref 8–23)
CO2: 25 mmol/L (ref 22–32)
Calcium: 9.6 mg/dL (ref 8.9–10.3)
Chloride: 106 mmol/L (ref 98–111)
Creatinine, Ser: 0.86 mg/dL (ref 0.44–1.00)
GFR, Estimated: >60 mL/min (ref >60)
Glucose, Bld: 91 mg/dL (ref 70–99)
Potassium: 3.9 mmol/L (ref 3.5–5.1)
Sodium: 139 mmol/L (ref 135–145)

## 2023-09-06 LAB — VITAMIN B1: Vitamin B1 (Thiamine): 167.1 nmol/L (ref 66.5–200.0)

## 2023-09-06 NOTE — Progress Notes (Signed)
 NEUROLOGY CONSULT FOLLOW UP NOTE   Date of service: September 06, 2023 Patient Name: Melissa Sosa MRN:  [id number] DOB:  06/09/1945  Interval Hx/subjective   Patient has been hemodynamically stable and afebrile overnight. Speech is slow but improved with less paraphasic errors. Still having word finding difficulties and speech hesitancy. Son at the bedside states she is at about 80% to her baseline.   No further fluctuations in ability to speak  Vitals   Vitals:   09/05/23 1925 09/05/23 2344 09/06/23 0416 09/06/23 0852  BP: 137/62 (!) 132/59 129/68 121/64  Pulse: 96 90 86 80  Resp: 20  18 18   Temp: 98.5 F (36.9 C) 98.1 F (36.7 C) 97.8 F (36.6 C) 98.1 F (36.7 C)  TempSrc: Oral Oral Axillary Oral  SpO2: 96% 97% 96% 97%  Weight:      Height:       Current vital signs: BP 121/64 (BP Location: Right Arm)   Pulse 80   Temp 98.1 F (36.7 C) (Oral)   Resp 18   Ht 5\' 6"  (1.676 m)   Wt 93.9 kg   SpO2 97%   BMI 33.41 kg/m  Vital signs in last 24 hours: Temp:  [97.8 F (36.6 C)-98.5 F (36.9 C)] 98.1 F (36.7 C) (04/18 0852) Pulse Rate:  [80-96] 80 (04/18 0852) Resp:  [17-20] 18 (04/18 0852) BP: (121-152)/(59-77) 121/64 (04/18 0852) SpO2:  [95 %-97 %] 97 % (04/18 0852)    Body mass index is 33.41 kg/m.  Physical Exam   Constitutional: Appears well-developed and well-nourished.  Psych: Affect appropriate to situation.  Eyes: No scleral injection.  HENT: No OP obstrucion.  Head: Normocephalic.  Respiratory: Effort normal, non-labored breathing.  Skin: WDI.   Neurologic Examination    NEURO:  Mental Status: Patient is alert, states name, location, date of birth.  Able to tell the story of events leading up to her hospitalization.  However she does not remember the day she was admitted to the hospital.  Social speech is intact.  Still has some hesitancy and paucity of speech.  She did not make any paraphasic errors while talking to me.  She is able to identify  family members and read off the menu and the signs in the room.  Son states states she was more talkative last night and this morning.  But she is largely improved from yesterday during the day.   Cranial Nerves:  II: PERRL. VF intact.  III, IV, VI: Eyes midline, EOM intact. VII: Smile is symmetrical.  VIII: hearing intact to voice. IX, X: Phonation is normal.  XII: Tongue protrusion midline Motor: Able to move all four extremities with symmetrical, antigravity strength Tone: is normal and bulk is normal Sensation- Intact to light touch bilaterally.  Coordination: Unable to perform Gait- deferred     Medications  Current Facility-Administered Medications:    acetaminophen  (TYLENOL ) tablet 1,000 mg, 1,000 mg, Oral, Q6H, Dema Filler, MD, 1,000 mg at 09/06/23 0256   cholecalciferol  (VITAMIN D3) 25 MCG (1000 UNIT) tablet 2,000 Units, 2,000 Units, Oral, Daily, Gwyndolyn Lerner, MD, 2,000 Units at 09/05/23 1052   enoxaparin  (LOVENOX ) injection 40 mg, 40 mg, Subcutaneous, Q24H, de Thayne Fine, [place] E, NP, 40 mg at 09/05/23 2013   levETIRAcetam  (KEPPRA ) 750 mg in sodium chloride  0.9 % 100 mL IVPB, 750 mg, Intravenous, BID **OR** levETIRAcetam  (KEPPRA ) tablet 750 mg, 750 mg, Oral, BID, Dennisha Mouser L, MD, 750 mg at 09/05/23 2136   lisinopril  (ZESTRIL ) tablet 5 mg, 5  mg, Oral, Daily, Penni Bowman T, MD, 5 mg at 09/05/23 1050   nortriptyline  (PAMELOR ) capsule 25 mg, 25 mg, Oral, QHS, Gwyndolyn Lerner, MD, 25 mg at 09/05/23 2136   ondansetron  (ZOFRAN ) tablet 4 mg, 4 mg, Oral, Q8H PRN, Gwyndolyn Lerner, MD   Oral care mouth rinse, 15 mL, Mouth Rinse, PRN, Arn Lane, MD   pantoprazole  (PROTONIX ) EC tablet 20 mg, 20 mg, Oral, Daily, Gwyndolyn Lerner, MD, 20 mg at 09/05/23 1052   predniSONE  (DELTASONE ) tablet 5 mg, 5 mg, Oral, Daily, Gwyndolyn Lerner, MD, 5 mg at 09/05/23 1052   sodium chloride  flush (NS) 0.9 % injection 3 mL, 3 mL, Intravenous, Once, Soto, Johana, PA-C    tamoxifen  (NOLVADEX ) tablet 20 mg, 20 mg, Oral, QHS, Penni Bowman T, MD, 20 mg at 09/05/23 2137   thiamine  (VITAMIN B1) tablet 500 mg, 500 mg, Oral, Daily, de Thayne Fine, [place] E, NP, 500 mg at 09/05/23 1051  Labs and Diagnostic Imaging   CBC:  Recent Labs  Lab 09/03/23 1042 09/03/23 1048 09/04/23 0615  WBC 9.2  --  7.3  NEUTROABS 7.2  --   --   HGB 15.1* 15.3* 13.8  HCT 44.2 45.0 40.6  MCV 86.0  --  85.3  PLT 299  --  251    Basic Metabolic Panel:  Lab Results  Component Value Date   NA 139 09/06/2023   K 3.9 09/06/2023   CO2 25 09/06/2023   GLUCOSE 91 09/06/2023   BUN 16 09/06/2023   CREATININE 0.86 09/06/2023   CALCIUM 9.6 09/06/2023   GFRNONAA >60 09/06/2023   GFRAA >60 10/31/2019   Lipid Panel:  Lab Results  Component Value Date   LDLCALC 87 09/04/2023   HgbA1c:  Lab Results  Component Value Date   HGBA1C 5.6 09/04/2023   Alcohol Level     Component Value Date/Time   ETH <10 09/03/2023 1042   INR  Lab Results  Component Value Date   INR 1.0 09/03/2023   APTT  Lab Results  Component Value Date   APTT 31 09/03/2023   RPR: Nonreactive ESR: 9 CRP: 0.5 A1c 5.6 LDL 87 TSH 0.615 Thiamine  pending B12 488 Folate >40 RPR nonreactive Meningitis encephalitis panel negative CSF RBC 24 and 2 1, WBC 4 CSF RBC 3 and WBC 2 and tube 4 CSF culture with Gram stain no WBC or organisms seen, culture pending CSF protein 30 CSF glucose 66  CT Head without contrast(Personally reviewed) -- both initial head CT as well as repeat head CT 4/16: No acute abnormality, atrophy and chronic small vessel ischemic changes  CT angio Head and Neck with contrast(Personally reviewed): No LVO, 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    Assessment   Melissa Sosa is a 78 y.o. female with history of HTN, PMR on PRN steroids, migraines and depression who presented with fluctuating aphasia.  On exam today, her speech is largely word salad with frequent neologisms.  Her son, who is at the bedside, states that this fluctuates and that at times, she is able to communicate without aphasia and appears oriented to her situation.  She has continued to complain of a left temporal headache.  MRI brain reveals possible punctate infarct in left periatrial white matter, but this  would not explain her symptoms.  She has been started on Keppra , and LTM EEG demonstrated left frontal slowing but no overt seizure activity.  ESR/CRP not consistent with systemic inflammation, and temporal artery US  negative for halo sign. Initial elevated Hgb maybe secondary to dehydration given urine +Ketones, on today's CBC, HGB not elevated, so initial result was likely due to dehydration.  LTM EEG demonstrates left frontotemporal slowing.  Given worsening of aphasia, repeat head CT was obtained which demonstrated no new acute abnormalities.  Lumbar puncture results are reassuring.  However, patient continued to have fluctuating fluent receptive and expressive aphasia --suspect she is having small focal seizures too subtle to capture on surface EEG. Now steadily improving after additional 250 mg dose of Keppra  given on 4/17 and Keppra  increased to 750 mg twice daily and one-time loading dose of Depakote 1500 mg 4/17 at 8 PM.   Continue to observe for continued improvement today. If she fluctuates again will further increase seizure medications   Recommendations  - follow thiamine  level; thiamine  500mg  daily empirically, okay to discontinue if results normal - S/p one-time loading dose of Depakote 1500 mg 4/17 at 8 PM.  - Continue Keppra  750 mg BID; room to increase to 1000 mg BID or add vimpat (if PR prolongation on EKG is  artifactual) - Please consider stopping Prednisone  5mg  daily -- she typically only takes this PRN and reports no significant symptoms at this time, and there is no significant ESR/CRP elevation at this time - Neurology will follow, discussed with primary team via secure chat  ______________________________________________________________________  Patient seen and examined by NP/APP with MD. MD to update note as needed.   Imogene Mana, DNP, FNP-BC [facility] Pager: [phone]  Attending Neurologist's note:  I personally saw this patient, gathering history, performing a full neurologic examination, reviewing relevant labs, personally reviewing relevant imaging including MRI, EEG, and formulated the assessment and plan, adding the note above for completeness and clarity to accurately reflect my thoughts  Baldwin Levee MD-PhD [facility] [phone]

## 2023-09-06 NOTE — Progress Notes (Signed)
 IP rehab admissions - I met with patient and her daughter at the bedside.  I gave them rehab booklets and explained CIR process.  Patient would like inpatient rehab admission prior to home.  Patient does live alone, but daughter and son can take turns to support patient at discharge as needed.  I have asked for an MD consult for inpatient rehab potential.  We are opening the case today with insurance carrier requesting CIR admission.  I will follow up once we hear back from insurance case manager.  [phone]

## 2023-09-06 NOTE — Assessment & Plan Note (Deleted)
 Likeliest etiology remains seizure. LP unremarkable. Speech pathology recommends >3 hours inpatient rehab.  Patient able to follow commands and answer questions appropriately this AM which is a great improvement. S/p x 1 IV valproic acid 1500 mg yesterday per Neuro. - Appreciate further neurology recommendations as below - IV Keppra  750 mg twice daily - Continue PO thiamine  - Continue Tylenol  1000 mg every 6 hours, consider changing to PRN now that patient is able to request medications - Awaiting further AMS labs (in process): Thiamine  - Seizure precautions - Delirium precautions - PT/OT -AM BMP

## 2023-09-06 NOTE — Progress Notes (Signed)
 Occupational Therapy Treatment Patient Details Name: Melissa Sosa MRN: [id number] DOB: 01-Feb-1946 Today's Date: 09/06/2023   History of present illness Pt is a 78 yo female admitted to [facility] on 09/03/23 with new onset of aphasia. 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 PMH of HTN, OA, lymphoma, anxiety   OT comments  Patient seen for further evaluation of expressive aphasia and overall cognitive status. Patient is much improved in her speech, and able to count to 25 with extra time and then have basic conversation with cues to regroup and take a breath prior to her getting frustrated. Patient and family receptive to education on listening to familiar music, naming familiar categories or going through rote sequences prior to attempting further language tasks. Patient able to read menu for breakfast with extra time and locate number (min A required) and able to read number aloud after 2 tries. OT continues to highly recommend intensive rehab > 3 hours; OT will continue to follow.       If plan is discharge home, recommend the following:  A little help with walking and/or transfers;A little help with bathing/dressing/bathroom;Supervision due to cognitive status;Direct supervision/assist for financial management;Direct supervision/assist for medications management   Equipment Recommendations  None recommended by OT    Recommendations for Other Services      Precautions / Restrictions Precautions Precautions: Fall Recall of Precautions/Restrictions: Impaired Restrictions Weight Bearing Restrictions Per Provider Order: No       Mobility Bed Mobility Overal bed mobility: Needs Assistance             General bed mobility comments: politely declining    Transfers Overall transfer level: Needs assistance                 General transfer comment: politely declining     Balance Overall balance assessment: Needs assistance                                          ADL either performed or assessed with clinical judgement   ADL Overall ADL's : Needs assistance/impaired                                       General ADL Comments: Session focus on cognitive strategies to further assess aphasia    Extremity/Trunk Assessment              Vision       Perception     Praxis     Communication Communication Communication: Impaired Factors Affecting Communication: Reduced clarity of speech;Difficulty expressing self   Cognition Arousal: Alert Behavior During Therapy: WFL for tasks assessed/performed Cognition: Difficult to assess Difficult to assess due to: Impaired communication           OT - Cognition Comments: Patient with improved expressive aphasia, working on cognitive strategies with OT                 Following commands: Impaired Following commands impaired: Follows one step commands with increased time      Cueing   Cueing Techniques: Verbal cues, Gestural cues, Tactile cues, Visual cues  Exercises      Shoulder Instructions       General Comments VSS    Pertinent Vitals/ Pain       Pain  Assessment Pain Assessment: No/denies pain  Home Living                                          Prior Functioning/Environment              Frequency  Min 2X/week        Progress Toward Goals  OT Goals(current goals can now be found in the care plan section)  Progress towards OT goals: Progressing toward goals  Acute Rehab OT Goals Patient Stated Goal: to get to rehab OT Goal Formulation: With patient/family Time For Goal Achievement: 09/19/23 Potential to Achieve Goals: Good  Plan      Co-evaluation                 AM-PAC OT 6 Clicks Daily Activity     Outcome Measure   Help from another person eating meals?: A Little Help from another person taking care of personal grooming?: A Little Help from another person  toileting, which includes using toliet, bedpan, or urinal?: A Little Help from another person bathing (including washing, rinsing, drying)?: A Lot Help from another person to put on and taking off regular upper body clothing?: A Little Help from another person to put on and taking off regular lower body clothing?: A Lot 6 Click Score: 16    End of Session    OT Visit Diagnosis: Other symptoms and signs involving cognitive function;Cognitive communication deficit (R41.841);Unsteadiness on feet (R26.81) Symptoms and signs involving cognitive functions: 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 Jennet Scroggin, OTR/L Acute Rehabilitation Services [phone]   Ronal Gift Salt 09/06/2023, 9:25 AM

## 2023-09-06 NOTE — Assessment & Plan Note (Signed)
 Polymyalgia rheumatica: Continue prednisone.  CRP/sed rate WNL Ductal carcinoma in situ: Continue tamoxifen IDA: Hgb 15.1, monitor clinically Hypertension: Discontinue hydrochlorothiazide for now

## 2023-09-06 NOTE — PMR Pre-admission (Signed)
 PMR Admission Coordinator Pre-Admission Assessment  Patient: Melissa Sosa is an 78 y.o., female MRN: [id number] DOB: Oct 03, 1945 Height: 5\' 6"  (167.6 cm) Weight: 93.9 kg              Insurance Information HMO: yes    PPO:       PCP:       IPA:       80/20:       OTHER:  Group [id number] PRIMARY: BCBS Medicare      Policy#: [id number]      Subscriber: patient CM Name: Camilo Cella      Phone#: [phone]     Fax#: [phone] Pre-Cert#: [id number] approved 09/07/23 for admission, call Camilo Cella when admitted for ref # and date to update      Employer: Retired Benefits:  Phone #: [phone]     Name: Verifed on Blue E portal Eff. Date: 05/22/23     Deduct: 40      Out of Pocket Max: $3500 (met $25)      Life Max: n/a  CIR: $400 for days 1-5      SNF: $0 for days 1-20; $214 for days 21-60; $0 for days 61-100 Outpatient: med nec     Co-Pay: $10/visit Home Health: 100%      Co-Pay: none DME: 80%     Co-Pay: 20% Providers: in network  SECONDARY:       Policy#:       Phone#:   Artist:       Phone#:   The Data processing manager" for patients in Inpatient Rehabilitation Facilities with attached "Privacy Act Statement-Health Care Records" was provided and verbally reviewed with: Patient  Emergency Contact Information Contact Information     Name Relation Home Work Mobile   [name] [phone]        Other Contacts   None on File    Current Medical History  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 female with past medical history of breast cancer, hypertension, migraines, polymyalgia rheumatica who was admitted on 4/15 for new onset aphasia.  She continues to have fluctuating receptive and expressive aphasia.  Patient has had complaints of left temporal headache.  MRI of the brain with possible punctate cute infarct of the left parietal white matter, however this is not felt to be likely a cause of her  symptoms.  LTM EEG with left frontotemporal slowing but no seizure activity noted.   Neurology suspects she may be having small focal seizures to subtle to capture on EEG.  Patient has been started on antiepileptic medications by neurology with improvement noted.  Patient has chronic left knee pain gets viscosupplementation injections (3 shot series ). Patient seen by PT and OT and found to have deficits in the min assist/mod assist level and inpatient rehabilitation recommended.  Lives in a 1 story home, no STE, children can help after discharge.  Intermittently uses a cane for ambulation.  Complete NIHSS TOTAL: 1 Glasgow Coma Scale Score: 15  Patient[facility] medical [facility] from [facility] has been reviewed by the rehabilitation admission coordinator and physician.  Past Medical History  Past Medical History:  Diagnosis Date   Anxiety    Breast cancer (HCC)    Hypertension    PMR (polymyalgia rheumatica) (HCC)    Pneumonia     Has the patient had major surgery during 100 days prior to admission? No  Family History  family history is  not on file.   Current Medications   Current Facility-Administered Medications:    acetaminophen  (TYLENOL ) tablet 1,000 mg, 1,000 mg, Oral, Q6H, Dema Filler, MD, 1,000 mg at 09/07/23 1610   cholecalciferol  (VITAMIN D3) 25 MCG (1000 UNIT) tablet 2,000 Units, 2,000 Units, Oral, Daily, Gwyndolyn Lerner, MD, 2,000 Units at 09/07/23 9604   enoxaparin  (LOVENOX ) injection 40 mg, 40 mg, Subcutaneous, Q24H, de Thayne Fine, [place] E, NP, 40 mg at 09/06/23 2042   levETIRAcetam  (KEPPRA ) 750 mg in sodium chloride  0.9 % 100 mL IVPB, 750 mg, Intravenous, BID **OR** levETIRAcetam  (KEPPRA ) tablet 750 mg, 750 mg, Oral, BID, Bhagat, Srishti L, MD, 750 mg at 09/07/23 5409   lisinopril  (ZESTRIL ) tablet 5 mg, 5 mg, Oral, Daily, [place] Lax, Kehinde T, MD, 5 mg at 09/07/23 8119   nortriptyline  (PAMELOR ) capsule 25 mg, 25 mg, Oral, QHS, Gwyndolyn Lerner, MD, 25 mg at 09/06/23  2213   ondansetron  (ZOFRAN ) tablet 4 mg, 4 mg, Oral, Q8H PRN, Gwyndolyn Lerner, MD   Oral care mouth rinse, 15 mL, Mouth Rinse, PRN, Penni Bowman T, MD   pantoprazole  (PROTONIX ) EC tablet 20 mg, 20 mg, Oral, Daily, Gwyndolyn Lerner, MD, 20 mg at 09/07/23 1478   sodium chloride  flush (NS) 0.9 % injection 3 mL, 3 mL, Intravenous, Once, Soto, Johana, PA-C   tamoxifen  (NOLVADEX ) tablet 20 mg, 20 mg, Oral, QHS, [place], Kehinde T, MD, 20 mg at 09/06/23 2213  Patients Current Diet:  Diet Order             Diet regular Room service appropriate? Yes; Fluid consistency: Thin  Diet effective now                   Precautions / Restrictions Precautions Precautions: Fall Restrictions Weight Bearing Restrictions Per Provider Order: No   Has the patient had 2 or more falls or a fall with injury in the past year?No  Prior Activity Level Community (5-7x/wk): Went out 3-5 times a week, was driving.  Prior Functional Level Prior Function Prior Level of Function : Independent/Modified Independent, Driving Mobility Comments: ind no AD ADLs Comments: Ind  Self Care: Did the patient need help bathing, dressing, using the toilet or eating?  Independent  Indoor Mobility: Did the patient need assistance with walking from room to room (with or without device)? Independent  Stairs: Did the patient need assistance with internal or external stairs (with or without device)? Independent  Functional Cognition: Did the patient need help planning regular tasks such as shopping or remembering to take medications? Independent  Patient Information Are you of Hispanic, Latino/a,or Spanish origin?: A. No, not of Hispanic, Latino/a, or Spanish origin What is your race?: A. White Do you need or want an interpreter to communicate with a doctor or health care staff?: 0. No  Patient's Response To:  Health Literacy and Transportation Is the patient able to respond to health literacy and transportation  needs?: Yes Health Literacy - How often do you need to have someone help you when you read instructions, pamphlets, or other written material from your doctor or pharmacy?: Never In the past 12 months, has lack of transportation kept you from medical appointments or from getting medications?: No In the past 12 months, has lack of transportation kept you from meetings, work, or from getting things needed for daily living?: No  Journalist, newspaper / Equipment Home Equipment: Grab bars - tub/shower, Information systems manager - built in, [place] - quad, Higher education careers adviser held shower head  Prior Device Use: Indicate  devices/aids used by the patient prior to current illness, exacerbation or injury?  Occasional cane, keeps a cane in her car  Current Functional Level Cognition  Arousal/Alertness: Awake/alert Overall Cognitive Status: Impaired/Different from baseline Orientation Level: Oriented X4 Attention: Sustained Sustained Attention: Appears intact Memory:  (TBA) Awareness: Impaired Awareness Impairment: Emergent impairment Problem Solving:  (TBA) Behaviors: Perseveration Safety/Judgment: Impaired    Extremity Assessment (includes Sensation/Coordination)  Upper Extremity Assessment: Overall WFL for tasks assessed  Lower Extremity Assessment: Overall WFL for tasks assessed    ADLs  Overall ADL's : Needs assistance/impaired Eating/Feeding: Sitting, Supervision/ safety, Set up Grooming: Moderate assistance, Standing, Oral care Grooming Details (indicate cue type and reason): assist to get everything setup and to demonstrate the use of toothbrush Upper Body Bathing: Sitting, Minimal assistance Lower Body Bathing: Sitting/lateral leans, Moderate assistance Upper Body Dressing : Sitting, Minimal assistance Lower Body Dressing: Sit to/from stand, Moderate assistance Toilet Transfer: Contact guard assist, Rolling walker (2 wheels), Ambulation Toileting- Clothing Manipulation and Hygiene: Sitting/lateral lean,  Contact guard assist Functional mobility during ADLs: Contact guard assist, Rolling walker (2 wheels) General ADL Comments: Session focus on cognitive strategies to further assess aphasia    Mobility  Overal bed mobility: Needs Assistance Bed Mobility: Supine to Sit, Sit to Supine Supine to sit: Min assist Sit to supine: Min assist General bed mobility comments: politely declining    Transfers  Overall transfer level: Needs assistance Equipment used: Rolling walker (2 wheels) Transfers: Sit to/from Stand Sit to Stand: Min assist General transfer comment: politely declining    Ambulation / Gait / Stairs / Psychologist, prison and probation services  Ambulation/Gait Ambulation/Gait assistance: Contact guard assist Gait Distance (Feet): 100 Feet Assistive device: Rolling walker (2 wheels) Gait Pattern/deviations: Trunk flexed, Decreased stride length, Step-through pattern General Gait Details: 1 standing rest break. no LOB noted. Cues to follow OT due to aphasia. Pt unable to dual task. Gait velocity: decreased    Posture / Balance Balance Overall balance assessment: Needs assistance Sitting-balance support: Bilateral upper extremity supported, Feet supported Sitting balance-Leahy Scale: Good Standing balance support: Bilateral upper extremity supported, During functional activity Standing balance-Leahy Scale: Fair Standing balance comment: reliant on RW however was able to brush teeth at sink.    Special needs/care consideration Special service needs None     Previous Home Environment (from acute therapy documentation) Living Arrangements: Alone  Lives With: Alone Available Help at Discharge: Family, Available PRN/intermittently, Friend(s), Neighbor Type of Home: Other(Comment) (Condo) Home Layout: One level Home Access: Stairs to enter Entrance Stairs-Rails: None Secretary/administrator of Steps: 1 Bathroom Shower/Tub: Health visitor: Handicapped height Bathroom Accessibility:  Yes  Discharge Living Setting Plans for Discharge Living Setting: House, Alone (Does live alone, has dtr and son available) Type of Home at Discharge: House [employer]) Discharge Home Layout: One level Discharge Home Access: Stairs to enter Entrance Stairs-Rails: None Entrance Stairs-Number of Steps: 1 small threshold step at entry Discharge Bathroom Shower/Tub: Walk-in shower, Door Discharge Bathroom Toilet: Handicapped height Discharge Bathroom Accessibility: Yes How Accessible: Accessible via walker Does the patient have any problems obtaining your medications?: No  Social/Family/Support Systems Patient Roles: Parent (Has son, dtr, friends, neighbors) Solicitor Information: Jariana Shumard son - [phone] Anticipated Caregiver: Son and dtr Ability/Limitations of Caregiver: Dtr lives out of town.  Son can take time off to stay with patient after discharge. Caregiver Availability: Other (Comment) (Can manage 24/7 if it is needed at discharge) Discharge Plan Discussed with Primary Caregiver: Yes Is Caregiver In Agreement with Plan?: Yes  Does Caregiver/Family have Issues with Lodging/Transportation while Pt is in Rehab?: No   Goals Patient/Family Goal for Rehab: PT/OT/SLP mod I and supervision goals Expected length of stay: 5-7 days Pt/Family Agrees to Admission and willing to participate: Yes Program Orientation Provided & Reviewed with Pt/Caregiver Including Roles  & Responsibilities: Yes   Decrease burden of Care through IP rehab admission: N/A   Possible need for SNF placement upon discharge: Not planned   Patient Condition: This patient's condition remains as documented in the consult dated 09/06/23, in which the Rehabilitation Physician determined and documented that the patient's condition is appropriate for intensive rehabilitative care in an inpatient rehabilitation facility. Will admit to inpatient rehab today.  Preadmission Screen Completed By:  Chilton Couch, RN,  09/07/2023 11:13 AM ______________________________________________________________________   Discussed status with Dr. Lovorn on 09/07/23 at 1000 and received approval for admission today.  Admission Coordinator:  Chilton Couch, time1113/Date4/19/25

## 2023-09-06 NOTE — Progress Notes (Signed)
     Daily Progress Note Intern Pager: [phone]  Patient name: Melissa Sosa Medical record number: [id number] Date of birth: 01-03-1946 Age: 78 y.o. 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 Greatly improved this AM. Assessment & Plan Stroke-like symptoms Likeliest etiology remains seizure. LP unremarkable. Speech pathology recommends >3 hours inpatient rehab.  Patient able to follow commands and answer questions appropriately this AM which is a great improvement. S/p x 1 IV valproic acid 1500 mg yesterday per Neuro. - Appreciate further neurology recommendations as below - IV Keppra  750 mg twice daily - Continue PO thiamine  - Continue Tylenol  1000 mg every 6 hours, consider changing to PRN now that patient is able to request medications - Awaiting further AMS labs (in process): Thiamine  - Seizure precautions - Delirium precautions - PT/OT -AM BMP Chronic health problem Polymyalgia rheumatica: Continue prednisone .  CRP/sed rate WNL Ductal carcinoma in situ: Continue tamoxifen  IDA: Hgb 15.1, monitor clinically Hypertension: Discontinue hydrochlorothiazide  for now   FEN/GI: Full PPx: Lovenox  Dispo: SLP: >3 hours inpatient rehab.  Subjective:  Patient seen this morning at bedside. Son is present. Speech is much improved. Reports ongoing mild HA. No other concerns.   Objective: Temp:  [97.8 F (36.6 C)-98.8 F (37.1 C)] 97.8 F (36.6 C) (04/18 0416) Pulse Rate:  [86-96] 86 (04/18 0416) Resp:  [17-20] 18 (04/18 0416) BP: (121-152)/(59-77) 129/68 (04/18 0416) SpO2:  [95 %-97 %] 96 % (04/18 0416) Physical Exam: General: Lying in bed comfortably, no acute distress. HEENT:  normocephalic. Cardio: Regular rate, regular rhythm, no murmurs on exam. Pulm: Clear, no wheezing, no crackles. No increased work of breathing. Abdominal: bowel sounds present, soft, non-tender, non-distended. Extremities: no peripheral edema. Moves all extremities equally. Neuro: Alert and oriented x3, speech grossly normal in content with some residual hesitation and word-finding difficulties though significantly improved from prior. No facial asymmetry, strength intact and equal bilaterally in UE. Psych:  Cognition and judgment appear intact. Alert, communicative, and cooperative.  Laboratory: Most recent CBC Lab Results  Component Value Date   WBC 7.3 09/04/2023   HGB 13.8 09/04/2023   HCT 40.6 09/04/2023   MCV 85.3 09/04/2023   PLT 251 09/04/2023   Most recent BMP    Latest Ref Rng & Units 09/05/2023    5:04 AM  BMP  Glucose 70 - 99 mg/dL 88   BUN 8 - 23 mg/dL 15   Creatinine 1.30 - 1.00 mg/dL 8.65   Sodium 784 - 696 mmol/L 137   Potassium 3.5 - 5.1 mmol/L 3.7   Chloride 98 - 111 mmol/L 106   CO2 22 - 32 mmol/L 22   Calcium 8.9 - 10.3 mg/dL 9.4      Omar Bibber, DO 09/06/2023, 7:09 AM  PGY-1, [facility] FPTS Intern pager: [phone], text pages welcome Secure chat group [facility] Teaching Service

## 2023-09-06 NOTE — Assessment & Plan Note (Deleted)
 Polymyalgia rheumatica: Continue prednisone.  CRP/sed rate WNL Ductal carcinoma in situ: Continue tamoxifen IDA: Hgb 15.1, monitor clinically Hypertension: Discontinue hydrochlorothiazide for now

## 2023-09-06 NOTE — Consult Note (Addendum)
 Physical Medicine and Rehabilitation Consult Reason for Consult:Rhab Referring Physician: Dr. Bernardino Bridge   HPI: Melissa Sosa is a 78 y.o. female with past medical history of breast cancer, hypertension, migraines, polymyalgia rheumatica who was admitted on 4/15 for new onset aphasia.  She continues to have fluctuating receptive and expressive aphasia.  Patient has had complaints of left temporal headache.  MRI of the brain with possible punctate cute infarct of the left parietal white matter, however this is not felt to be likely a cause of her symptoms.  LTM EEG with left frontotemporal slowing but no seizure activity noted.   Neurology suspects she may be having small focal seizures to subtle to capture on EEG.  Patient has been started on antiepileptic medications by neurology with improvement noted.  Patient has chronic left knee pain gets viscosupplementation injections (3 shot series ). Patient seen by PT and OT and found to have deficits in the min assist/mod assist level and inpatient rehabilitation recommended.  Lives in a 1 story home, no STE, children can help after discharge.  Intermittently uses a cane for ambulation.   Review of Systems  Constitutional:  Negative for chills and fever.  HENT:  Negative for nosebleeds.   Eyes:  Negative for blurred vision and double vision.  Respiratory:  Negative for shortness of breath.   Cardiovascular:  Negative for chest pain.  Gastrointestinal:  Negative for abdominal pain, constipation and diarrhea.  Genitourinary: Negative.   Musculoskeletal:  Positive for joint pain (Chronic L knee pain).  Skin:  Negative for rash.  Neurological:  Positive for speech change and weakness. Negative for sensory change.   Past Medical History:  Diagnosis Date   Anxiety    Breast cancer (HCC)    Hypertension    PMR (polymyalgia rheumatica) (HCC)    Pneumonia    History reviewed. No pertinent surgical history. History reviewed. No pertinent family  history. Social History:  reports that she has never smoked. She has never used smokeless tobacco. She reports that she does not drink alcohol and does not use drugs. Allergies:  Allergies  Allergen Reactions   Sulfamethoxazole-Trimethoprim Hives and Rash   Venlafaxine Hives, Itching and Other (See Comments)    Headache   Imitrex [Sumatriptan]     hypertension   Morphine And Codeine Other (See Comments)    Hyper, confusion    Medications Prior to Admission  Medication Sig Dispense Refill   Cholecalciferol  (VITAMIN D3) 2000 units capsule Take 2,000 Units by mouth daily.     Cyanocobalamin  (VITAMIN B 12) 100 MCG LOZG Take 100 mcg by mouth daily.     cyclobenzaprine  (FLEXERIL ) 10 MG tablet Take 1 tablet (10 mg total) by mouth 2 (two) times daily as needed for muscle spasms. 20 tablet 0   Ginger 500 MG CAPS Take 500 mg by mouth daily.     lisinopril -hydrochlorothiazide  (PRINZIDE ,ZESTORETIC ) 10-12.5 MG tablet Take 0.5 tablets by mouth daily.     nortriptyline  (PAMELOR ) 25 MG capsule Take 25 mg by mouth at bedtime.     Omega-3 Fatty Acids (FISH OIL) 500 MG CAPS Take 1,000 mg by mouth daily.     ondansetron  (ZOFRAN ) 4 MG tablet Take 1 tablet (4 mg total) by mouth every 8 (eight) hours as needed for nausea or vomiting. 20 tablet 0   pantoprazole  (PROTONIX ) 20 MG tablet Take 1 tablet (20 mg total) by mouth daily. 30 tablet 0   predniSONE  (DELTASONE ) 10 MG tablet Take 5 mg by mouth daily.  tamoxifen  (NOLVADEX ) 20 MG tablet Take 20 mg by mouth at bedtime.     Turmeric 500 MG CAPS Take 500 mg by mouth daily.      Home: Home Living Family/patient expects to be discharged to:: Private residence Living Arrangements: Alone Available Help at Discharge: Family, Available PRN/intermittently, Friend(s), Neighbor Type of Home: Other(Comment) (Condo) Home Access: Stairs to enter Entrance Stairs-Number of Steps: 1 Entrance Stairs-Rails: None Home Layout: One level Bathroom Shower/Tub: Architectural technologist: Handicapped height Bathroom Accessibility: Yes Home Equipment: Grab bars - tub/shower, Shower seat - built in, [employer] - quad, Higher education careers adviser held shower head  Lives With: Alone  Functional History: Prior Function Prior Level of Function : Independent/Modified Independent, Driving Mobility Comments: ind no AD ADLs Comments: Ind Functional Status:  Mobility: Bed Mobility Overal bed mobility: Needs Assistance Bed Mobility: Supine to Sit, Sit to Supine Supine to sit: Min assist Sit to supine: Min assist General bed mobility comments: politely declining Transfers Overall transfer level: Needs assistance Equipment used: Rolling walker (2 wheels) Transfers: Sit to/from Stand Sit to Stand: Min assist General transfer comment: politely declining Ambulation/Gait Ambulation/Gait assistance: Contact guard assist Gait Distance (Feet): 100 Feet Assistive device: Rolling walker (2 wheels) Gait Pattern/deviations: Trunk flexed, Decreased stride length, Step-through pattern General Gait Details: 1 standing rest break. no LOB noted. Cues to follow OT due to aphasia. Pt unable to dual task. Gait velocity: decreased    ADL: ADL Overall ADL's : Needs assistance/impaired Eating/Feeding: Sitting, Supervision/ safety, Set up Grooming: Moderate assistance, Standing, Oral care Grooming Details (indicate cue type and reason): assist to get everything setup and to demonstrate the use of toothbrush Upper Body Bathing: Sitting, Minimal assistance Lower Body Bathing: Sitting/lateral leans, Moderate assistance Upper Body Dressing : Sitting, Minimal assistance Lower Body Dressing: Sit to/from stand, Moderate assistance Toilet Transfer: Contact guard assist, Rolling walker (2 wheels), Ambulation Toileting- Clothing Manipulation and Hygiene: Sitting/lateral lean, Contact guard assist Functional mobility during ADLs: Contact guard assist, Rolling walker (2 wheels) General ADL Comments: Session  focus on cognitive strategies to further assess aphasia  Cognition: Cognition Overall Cognitive Status: Impaired/Different from baseline Arousal/Alertness: Awake/alert Orientation Level: Oriented X4 Attention: Sustained Sustained Attention: Appears intact Memory:  (TBA) Awareness: Impaired Awareness Impairment: Emergent impairment Problem Solving:  (TBA) Behaviors: Perseveration Safety/Judgment: Impaired Cognition Arousal: Alert Behavior During Therapy: WFL for tasks assessed/performed Overall Cognitive Status: Impaired/Different from baseline  Blood pressure 121/64, pulse 80, temperature 98.1 F (36.7 C), temperature source Oral, resp. rate 18, height 5\' 6"  (1.676 m), weight 93.9 kg, SpO2 97%. Physical Exam  General: No apparent distress HEENT: Head is normocephalic, atraumatic, sclera anicteric, oral mucosa pink and moist, wearing glasses,  Neck: Supple without JVD or lymphadenopathy Heart: Reg rate and rhythm.  Chest: CTA bilaterally without wheezes, rales, or rhonchi; no distress Abdomen: Soft, non-tender, non-distended, bowel sounds positive. Extremities: No clubbing, cyanosis, or edema. Pulses are 2+ Psych: Pt's affect is appropriate. Pt is cooperative Skin: Clean and intact without signs of breakdown Neuro:    Mental Status: AAOx4, follows commands-intermittently requires cues due to receptive aphasia Speech/Languate: Able to name 1 out of 3 items and repetition altered-able to repeat 2 out of 3 phrases, expressive and receptive aphasia, decreased rate of speech and word finding difficulties, occasional paraphasia CRANIAL NERVES: II: PERRL. Visual fields full III, IV, VI: EOM intact, no gaze preference or deviation V: normal sensation bilaterally VII: no asymmetry VIII: normal hearing to speech IX, X: normal palatal elevation XI: 5/5 head turn and  5/5 shoulder shrug bilaterally XII: Tongue midline   MOTOR: RUE: 4+/5 Deltoid, 5/5 Biceps, 4+/5 Triceps,5/5  Grip LUE: 4+/5 Deltoid, 5/5 Biceps, 4+/5 Triceps, 5/5 Grip RLE: HF 4/5, KE 4/5, ADF 5/5, APF 5/5 LLE: HF 5/5, KE 5/5, ADF 5/5, APF 5/5   REFLEXES: 2/4 throughout, bilateral flexor plantar response, no Hoffman's, no clonus  SENSORY: Normal to touch all 4 extremities  Coordination: Normal finger to nose overall intact bilaterally  MSK: L knee pain with ROM, minimal tenderness Healed knee incision -reports from surgery many years ago   Results for orders placed or performed during the hospital encounter of 09/03/23 (from the past 24 hours)  Basic metabolic panel with GFR     Status: None   Collection Time: 09/06/23  5:55 AM  Result Value Ref Range   Sodium 139 135 - 145 mmol/L   Potassium 3.9 3.5 - 5.1 mmol/L   Chloride 106 98 - 111 mmol/L   CO2 25 22 - 32 mmol/L   Glucose, Bld 91 70 - 99 mg/dL   BUN 16 8 - 23 mg/dL   Creatinine, Ser 1.61 0.44 - 1.00 mg/dL   Calcium 9.6 8.9 - 09.6 mg/dL   GFR, Estimated >04 >54 mL/min   Anion gap 8 5 - 15   ECHOCARDIOGRAM COMPLETE Result Date: 09/04/2023    ECHOCARDIOGRAM REPORT   Patient Name:   Cletus Dakins Date of Exam: 09/04/2023 Medical Rec #:  [id number]     Height:       66.0 in Accession #:    [id number]    Weight:       207.0 lb Date of Birth:  1945/12/31      BSA:          2.029 m Patient Age:    78 years      BP:           124/60 mmHg Patient Gender: F             HR:           86 bpm. Exam Location:  Inpatient Procedure: 2D Echo, Cardiac Doppler and Color Doppler (Both Spectral and Color            Flow Doppler were utilized during procedure). Indications:    Stroke  History:        Patient has no prior history of Echocardiogram examinations.                 Risk Factors:Hypertension.  Sonographer:    Astrid Blamer Referring Phys: [id number] Raye Cai T ENIOLA IMPRESSIONS  1. Left ventricular ejection fraction, by estimation, is 55 to 60%. The left ventricle has normal function. The left ventricle has no regional wall motion abnormalities. There is  mild concentric left ventricular hypertrophy. Left ventricular diastolic parameters are consistent with Grade II diastolic dysfunction (pseudonormalization).  2. Right ventricular systolic function is normal. The right ventricular size is normal.  3. Left atrial size was mildly dilated.  4. The mitral valve is normal in structure. Mild mitral valve regurgitation. No evidence of mitral stenosis.  5. The aortic valve is normal in structure. Aortic valve regurgitation is not visualized. No aortic stenosis is present. FINDINGS  Left Ventricle: Left ventricular ejection fraction, by estimation, is 55 to 60%. The left ventricle has normal function. The left ventricle has no regional wall motion abnormalities. The left ventricular internal cavity size was normal in size. There is  mild concentric left ventricular hypertrophy. Left ventricular diastolic parameters are  consistent with Grade II diastolic dysfunction (pseudonormalization). Right Ventricle: The right ventricular size is normal. No increase in right ventricular wall thickness. Right ventricular systolic function is normal. Left Atrium: Left atrial size was mildly dilated. Right Atrium: Right atrial size was normal in size. Pericardium: There is no evidence of pericardial effusion. Mitral Valve: The mitral valve is normal in structure. Mild mitral valve regurgitation. No evidence of mitral valve stenosis. Tricuspid Valve: The tricuspid valve is normal in structure. Tricuspid valve regurgitation is mild . No evidence of tricuspid stenosis. Aortic Valve: The aortic valve is normal in structure. Aortic valve regurgitation is not visualized. No aortic stenosis is present. Aortic valve mean gradient measures 3.0 mmHg. Aortic valve peak gradient measures 4.9 mmHg. Aortic valve area, by VTI measures 2.02 cm. Pulmonic Valve: The pulmonic valve was normal in structure. Pulmonic valve regurgitation is mild. No evidence of pulmonic stenosis. Aorta: The aortic root is  normal in size and structure. Venous: The inferior vena cava was not well visualized. IAS/Shunts: No atrial level shunt detected by color flow Doppler.  LEFT VENTRICLE PLAX 2D LVIDd:         4.40 cm   Diastology LVIDs:         3.20 cm   LV e' medial:    5.00 cm/s LV PW:         1.20 cm   LV E/e' medial:  22.2 LV IVS:        1.00 cm   LV e' lateral:   7.40 cm/s LVOT diam:     1.80 cm   LV E/e' lateral: 15.0 LV SV:         46 LV SV Index:   22 LVOT Area:     2.54 cm  RIGHT VENTRICLE RV S prime:     11.70 cm/s TAPSE (M-mode): 1.9 cm LEFT ATRIUM             Index        RIGHT ATRIUM           Index LA Vol (A2C):   52.3 ml 25.77 ml/m  RA Area:     10.50 cm LA Vol (A4C):   43.1 ml 21.24 ml/m  RA Volume:   23.10 ml  11.38 ml/m LA Biplane Vol: 51.1 ml 25.18 ml/m  AORTIC VALVE AV Area (Vmax):    2.09 cm AV Area (Vmean):   2.14 cm AV Area (VTI):     2.02 cm AV Vmax:           111.00 cm/s AV Vmean:          83.300 cm/s AV VTI:            0.225 m AV Peak Grad:      4.9 mmHg AV Mean Grad:      3.0 mmHg LVOT Vmax:         91.30 cm/s LVOT Vmean:        70.200 cm/s LVOT VTI:          0.179 m LVOT/AV VTI ratio: 0.80  AORTA Ao Root diam: 2.80 cm Ao Asc diam:  3.10 cm MITRAL VALVE                TRICUSPID VALVE MV Area (PHT): 3.45 cm     TR Peak grad:   28.1 mmHg MV Decel Time: 220 msec     TR Vmax:        265.00 cm/s MV E velocity: 111.00 cm/s MV A  velocity: 97.80 cm/s   SHUNTS MV E/A ratio:  1.13         Systemic VTI:  0.18 m                             Systemic Diam: 1.80 cm Jules Oar MD Electronically signed by Jules Oar MD Signature Date/Time: 09/04/2023/3:13:38 PM    Final    DG FL GUIDED LUMBAR PUNCTURE Result Date: 09/04/2023 CLINICAL DATA:  78 year old female with aphasia. IR was requested for lumbar puncture. EXAM: LUMBAR PUNCTURE UNDER FLUOROSCOPY PROCEDURE: An appropriate skin entry site was determined fluoroscopically. Operator donned sterile gloves and mask. Skin site was marked, then prepped  with Betadine, draped in usual sterile fashion, and infiltrated locally with 1% lidocaine . A 20 gauge spinal needle advanced into the thecal sac at L5-S1 from a left interlaminar approach. Clear colorless CSF spontaneously returned, with opening pressure of 19 cm water. Twenty ml CSF were collected and divided among 4 sterile vials for the requested laboratory studies. The needle was then removed. The patient tolerated the procedure well and there were no complications. FLUOROSCOPY: Radiation Exposure Index (as provided by the fluoroscopic device): 6.5 mGy Kerma IMPRESSION: Technically successful lumbar puncture under fluoroscopy. This exam was performed by Lambert Pillion, PA-C, and was supervised and interpreted by Dr. Marland Silvas, MD. Electronically Signed   By: Nicoletta Barrier M.D.   On: 09/04/2023 14:41   CT HEAD WO CONTRAST ( ) Result Date: 09/04/2023 CLINICAL DATA:  Neuro deficit, acute, stroke suspected. Worsening aphasia. EXAM: CT HEAD WITHOUT CONTRAST TECHNIQUE: Contiguous axial images were obtained from the base of the skull through the vertex without intravenous contrast. RADIATION DOSE REDUCTION: This exam was performed according to the departmental dose-optimization program which includes automated exposure control, adjustment of the mA and/or kV according to patient size and/or use of iterative reconstruction technique. COMPARISON:  CT and MRI studies done yesterday. FINDINGS: Brain: The brain shows a normal appearance without evidence of malformation, atrophy, old or acute small or large vessel infarction, mass lesion, hemorrhage, hydrocephalus or extra-axial collection. Vascular: No hyperdense vessel. No evidence of atherosclerotic calcification. Skull: Normal.  No traumatic finding.  No focal bone lesion. Sinuses/Orbits: Sinuses are clear. Orbits appear normal. Mastoids are clear. Other: None significant IMPRESSION: Normal head CT. Electronically Signed   By: Bettylou Brunner M.D.   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 Yes Co-Morbidities requiring supervision/potential complications:  - Hypertension, polymyalgia rheumatica, ductal carcinoma in situ, left knee arthritis Due to bladder management, bowel management, safety, skin/wound care, disease management, medication administration, pain management, and patient education, does the patient require 24 hr/day rehab nursing? Yes Does the patient require coordinated care of a physician, rehab nurse, therapy disciplines of PT/OT/SLP to address physical and functional deficits in the context of the above medical diagnosis(es)? Yes Addressing deficits in the following areas: balance, endurance, locomotion, strength, transferring, bowel/bladder control, bathing, dressing, feeding, grooming, toileting, cognition, speech, language, and psychosocial support Can the patient actively participate in an intensive therapy program of at least 3 hrs of therapy per day at least 5 days per week? Yes The potential for patient to make measurable gains  while on inpatient rehab is excellent Anticipated functional outcomes upon discharge from inpatient rehab are modified independent and supervision  with PT, modified independent and supervision with OT, modified independent and supervision with SLP. Estimated rehab length of stay to reach the above functional goals is: 5 days Anticipated discharge destination: Home Overall Rehab/Functional Prognosis: excellent  POST ACUTE RECOMMENDATIONS: This patient's condition is appropriate for continued rehabilitative care in the following setting: CIR Patient has agreed to participate in recommended program.  Yes Note that insurance prior authorization may be required for reimbursement for recommended care.  Comment: I think she would be a candidate for CIR.  Rehab coordinator to follow-up and continue to monitor progress with therapy.   Additional considerations.  Consider Voltaren gel for her left knee    I have personally performed a face to face diagnostic evaluation of this patient. Additionally, I have examined the patient's medical record including any pertinent labs and radiographic images.    Thanks,  Lylia Sand, MD 09/06/2023

## 2023-09-06 NOTE — Care Plan (Deleted)
 FMTS Brief Progress Note  S: Patient seen this morning at bedside. Son is present. Speech is much improved. Reports ongoing mild HA. No other concerns.  O: BP 129/68 (BP Location: Right Arm)   Pulse 86   Temp 97.8 F (36.6 C) (Axillary)   Resp 18   Ht 5\' 6"  (1.676 m)   Wt 93.9 kg   SpO2 96%   BMI 33.41 kg/m   General: Lying in bed comfortably, no acute distress. HEENT: normocephalic. Cardio: Regular rate, regular rhythm, no murmurs on exam. Pulm: Clear, no wheezing, no crackles. No increased work of breathing. Abdominal: bowel sounds present, soft, non-tender, non-distended. Extremities: no peripheral edema. Moves all extremities equally. Neuro: Alert and oriented x3, speech grossly normal in content with some residual hesitation and word-finding difficulties though significantly improved from prior. No facial asymmetry, strength intact and equal bilaterally in UE. Psych:  Cognition and judgment appear intact. Alert, communicative, and cooperative.  Assessment & Plan Stroke-like symptoms Likeliest etiology remains seizure. LP unremarkable. Speech pathology recommends >3 hours inpatient rehab.  Patient able to follow commands and answer questions appropriately this AM which is a great improvement. S/p x 1 IV valproic acid 1500 mg yesterday per Neuro. - Appreciate further neurology recommendations as below - IV Keppra  750 mg twice daily - Continue PO thiamine  - Continue Tylenol  1000 mg every 6 hours, consider changing to PRN now that patient is able to request medications - Awaiting further AMS labs (in process): Thiamine  - Seizure precautions - Delirium precautions - PT/OT -AM BMP Chronic health problem Polymyalgia rheumatica: Continue prednisone .  CRP/sed rate WNL Ductal carcinoma in situ: Continue tamoxifen  IDA: Hgb 15.1, monitor clinically Hypertension: Discontinue hydrochlorothiazide  for now    Omar Bibber, DO 09/06/2023, 6:51 AM PGY-1, [facility]  Night Resident  Please page [phone] with questions.

## 2023-09-06 NOTE — Assessment & Plan Note (Signed)
 Likeliest etiology remains seizure. LP unremarkable. Speech pathology recommends >3 hours inpatient rehab.  Patient able to follow commands and answer questions appropriately this AM which is a great improvement. S/p x 1 IV valproic  acid 1500 mg yesterday per Neuro. - Appreciate further neurology recommendations as below - IV Keppra  750 mg twice daily - Continue PO thiamine  - Continue Tylenol  1000 mg every 6 hours, consider changing to PRN now that patient is able to request medications - Awaiting further AMS labs (in process): Thiamine  - Seizure precautions - Delirium precautions - PT/OT -AM BMP

## 2023-09-07 ENCOUNTER — Other Ambulatory Visit (HOSPITAL_COMMUNITY): Payer: Self-pay

## 2023-09-07 DIAGNOSIS — R4701 Aphasia: Secondary | ICD-10-CM | POA: Diagnosis not present

## 2023-09-07 DIAGNOSIS — E860 Dehydration: Secondary | ICD-10-CM | POA: Diagnosis not present

## 2023-09-07 DIAGNOSIS — R519 Headache, unspecified: Secondary | ICD-10-CM | POA: Diagnosis not present

## 2023-09-07 DIAGNOSIS — R410 Disorientation, unspecified: Secondary | ICD-10-CM | POA: Diagnosis not present

## 2023-09-07 DIAGNOSIS — R569 Unspecified convulsions: Secondary | ICD-10-CM | POA: Diagnosis not present

## 2023-09-07 LAB — BASIC METABOLIC PANEL WITH GFR
Anion gap: 8 (ref 5–15)
BUN: 22 mg/dL (ref 8–23)
CO2: 24 mmol/L (ref 22–32)
Calcium: 9.7 mg/dL (ref 8.9–10.3)
Chloride: 107 mmol/L (ref 98–111)
Creatinine, Ser: 0.98 mg/dL (ref 0.44–1.00)
GFR, Estimated: 59 mL/min — ABNORMAL LOW (ref >60)
Glucose, Bld: 93 mg/dL (ref 70–99)
Potassium: 3.6 mmol/L (ref 3.5–5.1)
Sodium: 139 mmol/L (ref 135–145)

## 2023-09-07 MED ORDER — LISINOPRIL 5 MG PO TABS
5.0000 mg | ORAL_TABLET | Freq: Every day | ORAL | 0 refills | Status: DC
  Filled 2023-09-07: qty 30, 30d supply, fill #0

## 2023-09-07 MED ORDER — LISINOPRIL 5 MG PO TABS: 5.0000 mg | ORAL_TABLET | Freq: Every day | ORAL | Status: DC

## 2023-09-07 MED ORDER — LEVETIRACETAM 750 MG PO TABS: 750.0000 mg | ORAL_TABLET | Freq: Two times a day (BID) | ORAL | Status: DC

## 2023-09-07 MED ORDER — ACETAMINOPHEN 500 MG PO TABS: 1000.0000 mg | ORAL_TABLET | Freq: Four times a day (QID) | ORAL | Status: AC | PRN

## 2023-09-07 MED ORDER — LEVETIRACETAM 750 MG PO TABS
750.0000 mg | ORAL_TABLET | Freq: Two times a day (BID) | ORAL | 0 refills | Status: DC
  Filled 2023-09-07: qty 60, 30d supply, fill #0

## 2023-09-07 MED ORDER — ACETAMINOPHEN 500 MG PO TABS: 1000.0000 mg | ORAL_TABLET | Freq: Four times a day (QID) | ORAL | Status: DC

## 2023-09-07 NOTE — Progress Notes (Signed)
 NEUROLOGY CONSULT FOLLOW UP NOTE   Date of service: September 07, 2023 Patient Name: Melissa Sosa MRN:  [id number] DOB:  November 06, 1945  Interval Hx/subjective   Son at the bedside states she is 77-90% better today. Speech is more fluid with less word finding difficulty. Still some confusion when it comes to using her phone. She is consistently improving. She did not sleep well last night due to the noise, hoping to go home soon.   Vitals   Vitals:   09/06/23 1543 09/07/23 0017 09/07/23 0330 09/07/23 0800  BP: 129/74 (!) 112/54 (!) 116/50 118/74  Pulse: 88 90 87 89  Resp: 18 16    Temp: 98.2 F (36.8 C) 98.2 F (36.8 C) 98.7 F (37.1 C) 98.4 F (36.9 C)  TempSrc: Oral Oral Oral Oral  SpO2: 98% 98% 97% 98%  Weight:      Height:       Current vital signs: BP 118/74 (BP Location: Right Arm)   Pulse 89   Temp 98.4 F (36.9 C) (Oral)   Resp 16   Ht 5\' 6"  (1.676 m)   Wt 93.9 kg   SpO2 98%   BMI 33.41 kg/m  Vital signs in last 24 hours: Temp:  [97.5 F (36.4 C)-98.7 F (37.1 C)] 98.4 F (36.9 C) (04/19 0800) Pulse Rate:  [80-90] 89 (04/19 0800) Resp:  [16-18] 16 (04/19 0017) BP: (108-129)/(36-74) 118/74 (04/19 0800) SpO2:  [97 %-99 %] 98 % (04/19 0800)    Body mass index is 33.41 kg/m.  Physical Exam   Constitutional: Appears well-developed and well-nourished.  Psych: Affect appropriate to situation.  Eyes: No scleral injection.  HENT: No OP obstrucion.  Head: Normocephalic.  Respiratory: Effort normal, non-labored breathing.  Skin: WDI.   Neurologic Examination    NEURO:  Mental Status: Patient is alert, states name, location, date of birth.  Social speech is intact.  Still has some hesitancy and paucity of speech.  She did not make any paraphasic errors while talking to me.  She is able to identify family members and read off the menu and the signs in the room.  Steady improvement from speech yesterday. Names objects correctly.  Cranial Nerves:  II: PERRL. VF  intact.  III, IV, VI: Eyes midline, EOM intact. VII: Smile is symmetrical.  VIII: hearing intact to voice. IX, X: Phonation is normal.  XII: Tongue protrusion midline Motor: Able to move all four extremities with symmetrical, antigravity strength Tone: is normal and bulk is normal Sensation- Intact to light touch bilaterally.  Coordination: Unable to perform Gait- deferred     Medications  Current Facility-Administered Medications:    acetaminophen  (TYLENOL ) tablet 1,000 mg, 1,000 mg, Oral, Q6H, Mabe, Doroteo Gasmen, MD, 1,000 mg at 09/06/23 2042   cholecalciferol  (VITAMIN D3) 25 MCG (1000 UNIT) tablet 2,000 Units, 2,000 Units, Oral, Daily, Gwyndolyn Lerner, MD, 2,000 Units at 09/06/23 1002   enoxaparin  (LOVENOX ) injection 40 mg, 40 mg, Subcutaneous, Q24H, de Thayne Fine, Dellrose E, NP, 40 mg at 09/06/23 2042   levETIRAcetam  (KEPPRA ) 750 mg in sodium chloride  0.9 % 100 mL IVPB, 750 mg, Intravenous, BID **OR** levETIRAcetam  (KEPPRA ) tablet 750 mg, 750 mg, Oral, BID, Sharman Garrott L, MD, 750 mg at 09/06/23 2213   lisinopril  (ZESTRIL ) tablet 5 mg, 5 mg, Oral, Daily, Eniola, Kehinde T, MD, 5 mg at 09/06/23 1003   nortriptyline  (PAMELOR ) capsule 25 mg, 25 mg, Oral, QHS, Gwyndolyn Lerner, MD, 25 mg at 09/06/23 2213   ondansetron  (ZOFRAN ) tablet 4 mg, 4  mg, Oral, Q8H PRN, Gwyndolyn Lerner, MD   Oral care mouth rinse, 15 mL, Mouth Rinse, PRN, Arn Lane, MD   pantoprazole  (PROTONIX ) EC tablet 20 mg, 20 mg, Oral, Daily, Gwyndolyn Lerner, MD, 20 mg at 09/06/23 1002   sodium chloride  flush (NS) 0.9 % injection 3 mL, 3 mL, Intravenous, Once, Soto, Johana, PA-C   tamoxifen  (NOLVADEX ) tablet 20 mg, 20 mg, Oral, QHS, Penni Bowman T, MD, 20 mg at 09/06/23 2213  Labs and Diagnostic Imaging   CBC:  Recent Labs  Lab 09/03/23 1042 09/03/23 1048 09/04/23 0615  WBC 9.2  --  7.3  NEUTROABS 7.2  --   --   HGB 15.1* 15.3* 13.8  HCT 44.2 45.0 40.6  MCV 86.0  --  85.3  PLT 299  --  251     Basic Metabolic Panel:  Lab Results  Component Value Date   NA 139 09/07/2023   K 3.6 09/07/2023   CO2 24 09/07/2023   GLUCOSE 93 09/07/2023   BUN 22 09/07/2023   CREATININE 0.98 09/07/2023   CALCIUM 9.7 09/07/2023   GFRNONAA 59 (L) 09/07/2023   GFRAA >60 10/31/2019   Lipid Panel:  Lab Results  Component Value Date   LDLCALC 87 09/04/2023   HgbA1c:  Lab Results  Component Value Date   HGBA1C 5.6 09/04/2023   Alcohol Level     Component Value Date/Time   ETH <10 09/03/2023 1042   INR  Lab Results  Component Value Date   INR 1.0 09/03/2023   APTT  Lab Results  Component Value Date   APTT 31 09/03/2023   RPR: Nonreactive ESR: 9 CRP: 0.5 A1c 5.6 LDL 87 TSH 0.615 Thiamine  pending B12 488 Folate >40 RPR nonreactive Meningitis encephalitis panel negative CSF RBC 24 and 2 1, WBC 4 CSF RBC 3 and WBC 2 and tube 4 CSF culture with Gram stain no WBC or organisms seen, culture pending CSF protein 30 CSF glucose 66  CT Head without contrast(Personally reviewed) -- both initial head CT as well as repeat head CT 4/16: No acute abnormality, atrophy and chronic small vessel ischemic changes  CT angio Head and Neck with contrast(Personally reviewed): No LVO, 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    Assessment   Melissa Sosa is a 78 y.o. female with history of HTN, PMR on PRN steroids, migraines and depression who presented with fluctuating aphasia.  On exam today, her speech is largely word salad with frequent neologisms.  Her son, who is at the bedside, states that this fluctuates and that at times, she is able to communicate without aphasia and appears oriented to her situation.  She has continued  to complain of a left temporal headache.  MRI brain reveals possible punctate infarct in left periatrial white matter, but this would not explain her symptoms.  She has been started on Keppra , and LTM EEG demonstrated left frontal slowing but no overt seizure activity.  ESR/CRP not consistent with systemic inflammation, and temporal artery US  negative for halo sign. Initial elevated Hgb maybe secondary to dehydration given urine +Ketones, on today's CBC, HGB not elevated, so initial result was likely due to dehydration. Is diagnosed with PMR and takes Prednisone  5mg  PRN   LTM EEG demonstrates left frontotemporal slowing.  Given worsening of aphasia, repeat head CT was obtained which demonstrated no new acute abnormalities.  Lumbar puncture results are reassuring.  However, patient continued to have fluctuating fluent receptive and expressive aphasia -- suspect she was having small focal seizures too subtle to capture on surface EEG. Now steadily improving after additional 250 mg dose of Keppra  given on 4/17 and Keppra  increased to 750 mg twice daily and one-time loading dose of Depakote 1500 mg 4/17 at 8 PM.  Given her aphasia has continued to steadily improve with no episodes of worsening since these changes, she is now stable for discharge without further inpatient workup needed   Recommendations  - follow thiamine  level; thiamine  500mg  daily empirically, okay to discontinue if results normal - S/p one-time loading dose of Depakote 1500 mg 4/17 at 8 PM.  - Continue Keppra  750 mg BID - Seizure precautions reviewed and included in discharge instructions - Outpatient referral to Dr. Samara Crest placed - Inpatient neurology will sign off at this time, please reach out with any additional questions or concerns ______________________________________________________________________  Patient seen and examined by NP/APP with MD. MD to update note as needed.   Imogene Mana, DNP, FNP-BC [facility] Pager: [phone]  Seizure precautions: Per [place]  DMV statutes, patients with seizures are not allowed to drive until they have been seizure-free for six months and cleared by a physician    Use caution when using heavy equipment or power tools. Avoid working on ladders or at heights. Take showers instead of baths. Ensure the water temperature is not too high on the home water heater. Do not go swimming alone. Do not lock yourself in a room alone (i.e. bathroom). When caring for infants or small children, sit down when holding, feeding, or changing them to minimize risk of injury to the child in the event you have a seizure. Maintain good sleep hygiene. Avoid alcohol.   Attending Neurologist's note:  Eager for discharge  On my exam, good attention/concentration, following all commands, speech fluent with minor hesitations at times and minor paraphasic errors such as jeans instead of beans.  Coordination intact bilaterally for high-five.     I personally saw this patient, gathering history, performing a neurologic examination, reviewing relevant labs, personally reviewing relevant imaging including , and formulated the assessment and plan, adding to the assessment and plan above for completeness and clarity to accurately reflect my thoughts  Baldwin Levee MD-PhD [facility] [phone] Available 7 AM to 7 PM, outside these hours please contact Neurologist on call listed on AMION

## 2023-09-07 NOTE — Discharge Summary (Addendum)
 [facility] Discharge Summary  Patient name: Melissa Sosa Medical record number: [id number] Date of birth: 14-May-1946 Age: 78 y.o. 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   Her hospital course is detailed below:  AMS  Stroke-like symptoms Prior to presentation at hospital patient was apparently found by a neighbor, who noted aphasia and possible ataxia. In the ED patient underwent extensive head imaging, which largely returned negative.  Questionable punctate focus in periventricular white matter of the left side, however this would not explain patient's symptomatology.  Started on long-term monitoring electroencephalogram after consultation with neurology, which showed focal slowing on the left side, possible postictal state but no epileptiform changes.  Patient had an episode of decreased mental status; repeat stat CT negative.  LP, serum altered mental status labs all returned negative. Primary differential: Focal seizures not captured on EEG. Started on Keppra , which titrated upward to 750 mg twice daily.  Loaded with IV  Depakote.  With therapy, patient continued to improve with speech fluency.  She was recommended for CIR though declined; therefore, she was discharged with outpatient SLP.  Other chronic conditions were medically managed with home medications and formulary alternatives as necessary (polymyalgia rheumatica, DCIS, IDA, HTN)  PCP Follow-up Recommendations: Needs close neurology follow-up Continued SLP needs Continued daily prednisone  5 mg for polymyalgia rheumatica; recommend reevaluating need   Disposition: Home with home health (patient declined CIR)  Discharge Condition: Stable  Discharge Exam:  Vitals:   09/07/23 0800 09/07/23 1121  BP: 118/74 131/67  Pulse: 89 94  Resp:  18  Temp: 98.4 F (36.9 C) 98.2 F (36.8 C)  SpO2: 98% 98%   Per Dr. Bernardino Bridge this morning: General: Lying in bed, well-appearing, NAD Cardiovascular: RRR Respiratory: Normal work of breathing on room air Neuro: Alert and oriented x 3.  Speech continues to be grossly normal in content with improvement in hesitation  Significant Procedures: EEG, LP  Significant Labs and Imaging:     Latest Ref Rng & Units 09/04/2023    6:15 AM 09/03/2023   10:48 AM 09/03/2023   10:42 AM  CBC  WBC 4.0 - 10.5 K/uL 7.3   9.2   Hemoglobin 12.0 - 15.0 g/dL 40.9  81.1  91.4   Hematocrit 36.0 - 46.0 % 40.6  45.0  44.2   Platelets 150 - 400 K/uL 251   299       Latest Ref Rng & Units 09/07/2023    6:34 AM 09/06/2023    5:55 AM 09/05/2023    5:04 AM  CMP  Glucose 70 - 99 mg/dL 93  91  88   BUN 8 - 23 mg/dL 22  16  15    Creatinine 0.44 -  1.00 mg/dL 0.27  2.53  6.64   Sodium 135 - 145 mmol/L 139  139  137   Potassium 3.5 - 5.1 mmol/L 3.6  3.9  3.7   Chloride 98 - 111 mmol/L 107  106  106   CO2 22 - 32 mmol/L 24  25  22    Calcium 8.9 - 10.3 mg/dL 9.7  9.6  9.4    Recent Labs  Lab 09/06/23 0555 09/07/23 0634  NA 139 139  K 3.9 3.6  CL 106 107  CO2 25 24  GLUCOSE 91 93  BUN 16 22  CREATININE 0.86 0.98  CALCIUM 9.6  9.7   CSF culture without WBC organisms Meningitis/encephalitis panel negative  Results/Tests Pending at Time of Discharge: None  Discharge Medications:  Allergies as of 09/07/2023       Reactions   Sulfamethoxazole-trimethoprim Hives, Rash   Venlafaxine Hives, Itching, Other (See Comments)   Headache   Imitrex [sumatriptan]    hypertension   Morphine And Codeine Other (See Comments)   Hyper, confusion         Medication List     PAUSE taking these medications    cyclobenzaprine  10 MG tablet Wait to take this until your doctor or other care provider tells you to start again. Commonly known as: FLEXERIL  Take 1 tablet (10 mg total) by mouth 2 (two) times daily as needed for muscle spasms.   lisinopril -hydrochlorothiazide  10-12.5 MG tablet Wait to take this until your doctor or other care provider tells you to start again. Commonly known as: ZESTORETIC  Take 0.5 tablets by mouth daily.       TAKE these medications    acetaminophen  500 MG tablet Commonly known as: TYLENOL  Take 2 tablets (1,000 mg total) by mouth every 6 (six) hours.   Fish Oil 500 MG Caps Take 1,000 mg by mouth daily.   Ginger 500 MG Caps Take 500 mg by mouth daily.   levETIRAcetam  750 MG tablet Commonly known as: KEPPRA  Take 1 tablet (750 mg total) by mouth 2 (two) times daily.   lisinopril  5 MG tablet Commonly known as: ZESTRIL  Take 1 tablet (5 mg total) by mouth daily. Start taking on: September 08, 2023   nortriptyline  25 MG capsule Commonly known as: PAMELOR  Take 25 mg by mouth at bedtime.   ondansetron  4 MG tablet Commonly known as: Zofran  Take 1 tablet (4 mg total) by mouth every 8 (eight) hours as needed for nausea or vomiting.   pantoprazole  20 MG tablet Commonly known as: PROTONIX  Take 1 tablet (20 mg total) by mouth daily.   predniSONE  10 MG tablet Commonly known as: DELTASONE  Take 5 mg by mouth daily.   tamoxifen  20 MG tablet Commonly known as: NOLVADEX  Take 20 mg by  mouth at bedtime.   Turmeric 500 MG Caps Take 500 mg by mouth daily.   Vitamin B 12 100 MCG Lozg Take 100 mcg by mouth daily.   Vitamin D3 50 MCG (2000 UT) capsule Take 2,000 Units by mouth daily.        Discharge Instructions: Please refer to Patient Instructions section of EMR for full details.  Patient was counseled important signs and symptoms that should prompt return to medical care, changes in medications, dietary instructions, activity restrictions, and follow up appointments.   Follow-Up Appointments:  Follow-up Information     Renda Carpen, MD. Schedule an appointment as soon as possible for a visit.   Specialty: Family Medicine Why: For hospital follow-up ASAP. Contact information:  [address] [place] [address] [phone]                 Dema Filler, MD 09/07/2023, 1:30 PM PGY-2, [facility]

## 2023-09-07 NOTE — Progress Notes (Signed)
     Daily Progress Note Intern Pager: [phone]  Patient name: Melissa Sosa Medical record number: [id number] Date of birth: 24-Apr-1946 Age: 78 y.o. Gender: female  Primary Care Provider: Renda Carpen, MD Consultants: Neurology Code Status: Full  Pt Overview and Major Events to Date:  4/15-admitted  Assessment and Plan: Melissa Sosa is a 78 year old female with past history of PMR and DCIS on tamoxifen  who presented with new onset expressive and receptive aphasia.  She continues to be greatly improved. Assessment & Plan Stroke-like symptoms Fluency continues to improve this morning. - Neurology following, appreciate recommendations. - Continue Keppra  750 mg twice daily - Continue PO thiamine  - Continue Tylenol  1000 mg every 6 hours, consider changing to PRN now that patient is able to request medications - Awaiting further AMS labs (in process): Thiamine  - Seizure precautions - Delirium precautions - PT/OT -AM BMP Chronic health problem Polymyalgia rheumatica: Reportedly takes prednisone  5 mg PRN at home; not receiving this currently. Ductal carcinoma in situ: Continue tamoxifen  IDA: Hgb 15.1, monitor clinically Hypertension: Discontinue hydrochlorothiazide  for now   FEN/GI: Regular diet PPx: Lovenox  Dispo: SLP greater than 3 hours inpatient rehab.  Subjective:  Patient seen this morning lying in bed with son at bedside.  She is doing well.  No concerns.  Objective: Temp:  [97.5 F (36.4 C)-98.2 F (36.8 C)] 98.2 F (36.8 C) (04/19 0017) Pulse Rate:  [80-90] 90 (04/19 0017) Resp:  [16-18] 16 (04/19 0017) BP: (108-129)/(36-74) 112/54 (04/19 0017) SpO2:  [97 %-99 %] 98 % (04/19 0017) Physical Exam: General: Lying in bed, well-appearing, NAD Cardiovascular: RRR Respiratory: Normal work of breathing on room air Neuro: Alert and oriented x 3.  Speech continues to be grossly normal in content with improvement in hesitation.  Laboratory: Most recent CBC Lab  Results  Component Value Date   WBC 7.3 09/04/2023   HGB 13.8 09/04/2023   HCT 40.6 09/04/2023   MCV 85.3 09/04/2023   PLT 251 09/04/2023   Most recent BMP    Latest Ref Rng & Units 09/06/2023    5:55 AM  BMP  Glucose 70 - 99 mg/dL 91   BUN 8 - 23 mg/dL 16   Creatinine 1.47 - 1.00 mg/dL 8.29   Sodium 562 - 130 mmol/L 139   Potassium 3.5 - 5.1 mmol/L 3.9   Chloride 98 - 111 mmol/L 106   CO2 22 - 32 mmol/L 25   Calcium 8.9 - 10.3 mg/dL 9.6     Omar Bibber, DO 09/07/2023, 6:48 AM  PGY-1, [facility] FPTS Intern pager: [phone], text pages welcome Secure chat group [facility] Teaching Service

## 2023-09-07 NOTE — Progress Notes (Signed)
 IP rehab admissions - I met with patient and family at the bedside to sign paperwork for CIR admission, but patient/family have changed their mind and now want to discharge directly home.  Apparently neurology told patient that she was doing well and could discharge home from their point of view.  I contacted FPTS and updated them.  I have canceled the admission to CIR for today.  Call me for questions.  [phone]

## 2023-09-07 NOTE — Discharge Instructions (Addendum)
 Dear Melissa Sosa,   Thank you so much for allowing us  to be part of your care!  You were admitted to [facility] for altered mental status. The Neurology team saw you while you were in the hospital and provided the below recommendations.    To control seizures, your medications have been adjusted as follows:  - Start Keppra  750 mg twice daily (about 12 hours apart)  Referral to [facility] has been placed for you to follow-up; if you do not get a call from their office please feel free to call for an appointment:  [phone]  Referral to Speech Therapy has also been placed for you.   Please keep track of any other spells you have, including spells of speech difficulty, shaking spells, loss of consciousness events, staring spells etc to review with your neurologist   Spell log:  - Date and time of event: - Description of event:  - Prodome (any warning / premonition event is going to happen): - Post-spell symptoms: - Any potential triggers:   Standard seizure precautions: Per [place]  DMV statutes, patients with seizures are not allowed to drive until  they have been seizure-free for six months. Use caution when using heavy equipment or power tools. Avoid working on ladders or at heights. Take showers instead of baths. Ensure the water temperature is not too high on the home water heater. Do not go swimming alone. When caring for infants or small children, sit down when holding, feeding, or changing them to minimize risk of injury to the child in the event you have a seizure.  To reduce risk of seizures, maintain good sleep hygiene avoid alcohol and illicit drug use, take all anti-seizure medications as prescribed.     Take care and be well!  Family Medicine Teaching Service  [place]  [facility]  [address] [place], [place] [address] [phone]

## 2023-09-07 NOTE — Progress Notes (Addendum)
 IP rehab admissions - We have received authorization from insurance case manager for CIR admission.  I will check with attending MD regarding potential admission to CIR today pending medical readiness.  Call for questions.  [phone]  Attending MD has cleared patient for discharge to inpatient rehab today.  Bed available on CIR.  [phone]

## 2023-09-07 NOTE — Assessment & Plan Note (Addendum)
 Polymyalgia rheumatica: Reportedly takes prednisone  5 mg PRN at home; not receiving this currently. Ductal carcinoma in situ: Continue tamoxifen  IDA: Hgb 15.1, monitor clinically Hypertension: Discontinue hydrochlorothiazide  for now

## 2023-09-07 NOTE — Assessment & Plan Note (Signed)
 Fluency continues to improve this morning. - Neurology following, appreciate recommendations. - Continue Keppra  750 mg twice daily - Continue PO thiamine  - Continue Tylenol  1000 mg every 6 hours, consider changing to PRN now that patient is able to request medications - Awaiting further AMS labs (in process): Thiamine  - Seizure precautions - Delirium precautions - PT/OT -AM BMP

## 2023-09-08 LAB — CSF CULTURE W GRAM STAIN
Culture: NO GROWTH
Gram Stain: NONE SEEN

## 2023-09-09 DIAGNOSIS — M25562 Pain in left knee: Secondary | ICD-10-CM | POA: Diagnosis not present

## 2023-09-09 DIAGNOSIS — R519 Headache, unspecified: Secondary | ICD-10-CM | POA: Diagnosis not present

## 2023-09-09 DIAGNOSIS — R569 Unspecified convulsions: Secondary | ICD-10-CM | POA: Diagnosis not present

## 2023-09-09 DIAGNOSIS — I10 Essential (primary) hypertension: Secondary | ICD-10-CM | POA: Diagnosis not present

## 2023-09-13 DIAGNOSIS — J069 Acute upper respiratory infection, unspecified: Secondary | ICD-10-CM | POA: Diagnosis not present

## 2023-09-16 ENCOUNTER — Encounter: Payer: Self-pay | Admitting: Neurology

## 2023-09-16 ENCOUNTER — Ambulatory Visit: Admitting: Neurology

## 2023-09-16 VITALS — BP 114/70 | HR 79 | Ht 64.0 in | Wt 197.5 lb

## 2023-09-16 DIAGNOSIS — G40109 Localization-related (focal) (partial) symptomatic epilepsy and epileptic syndromes with simple partial seizures, not intractable, without status epilepticus: Secondary | ICD-10-CM

## 2023-09-16 MED ORDER — LEVETIRACETAM 750 MG PO TABS: 750.0000 mg | ORAL_TABLET | Freq: Two times a day (BID) | ORAL | 3 refills | Status: DC

## 2023-09-16 NOTE — Progress Notes (Signed)
 [facility]  PATIENT: Melissa Sosa DOB: Aug 22, 1945  REQUESTING CLINICIAN: Chyrel Craw, NP HISTORY FROM: Patient/Son and chart review  REASON FOR VISIT: New onset epilepsy    HISTORICAL  CHIEF COMPLAINT:  Chief Complaint  Patient presents with   New Patient (Initial Visit)    Rm12, son present, NP/internal hospital f/u for new onset focal seizures: last sz was 09/03/23 and was hospitalized. Currently has sinus infection    HISTORY OF PRESENT ILLNESS:  This is a 78 year old woman with past medical history of hypertension, polymyalgia rheumatica, history of breast cancer, chronic pain who is presenting after being admitted to the hospital for aphasia, diagnosed with likely seizures.  Patient reported initially she did not feel well the day of presentation, she presented to her neighbor and told her that she was not feeling well; from there her memory was not the best during the following next 2 days.  She tells me that she can remember bits and pieces during that time, that her speech was gibberish, sometimes she felt like she was saying something but it was not making sense.  Her friend did call 911 and she was taken to the hospital.  Family was also at bedside.  She had extensive workup including MRI, LP, and EEG which showed continuous left frontotemporal slowing.  She also had a temporal artery biopsy which was negative. After MRI was negative for stroke, she was loaded with Keppra  and the next morning, she tells me that her speech got better.  She was diagnosed with likely seizures, started on Keppra  750 mg twice daily.  Since starting the medication, her son tells me that patient's speech is back to baseline.  She does report some somnolence with Keppra  but otherwise she is doing very well.  She never had something similar in the past.  Again since taking the Keppra , she has not had any additional events.  Denies any family history of seizures, denies any previous  history of stroke or brain trauma or brain infection. Hospital course and summary below.   Handedness: Right handed  Onset: 09/03/2023  Seizure Type: Confusion, aphasia   Current frequency: only once while admitted in 09/03/2023  Any injuries from seizures: Denies   Seizure risk factors: Atrophy   Previous ASMs: None   Currenty ASMs: Levetiracetam  750 mg twice daily   ASMs side effects: Sleepiness, Tiredness   Brain Images: Generalized atrophy   Previous EEGs: Left fronto-temporal slowing    Hospital Course and Summary  [name] is a 78 y.o.female with a history of polymyalgia rheumatica, breast cancer, hypertension, and anxiety who was admitted to the [facility] Teaching Service at [facility] for new onset expressive and receptive aphasia.    Her hospital course is detailed below:   AMS  Stroke-like symptoms Prior to presentation at hospital patient was apparently found by a neighbor, who noted aphasia and possible ataxia. In the ED patient underwent extensive head imaging, which largely returned negative.  Questionable punctate focus in periventricular white matter of the left side, however this would not explain patient's symptomatology.  Started on long-term monitoring electroencephalogram after consultation with neurology, which showed focal slowing on the left side, possible postictal state but no epileptiform changes.  Patient had an episode of decreased mental status; repeat stat CT negative.  LP, serum altered mental status labs all returned negative. Primary differential: Focal seizures not captured on EEG. Started on Keppra , which titrated upward to 750 mg twice daily.  Loaded with IV Depakote.  With therapy, patient continued to improve with speech fluency.  She was recommended for CIR though declined; therefore, she was discharged with outpatient SLP.   OTHER MEDICAL CONDITIONS: Hypertension, Hyperlipidemia, History of breast cancer, Obesity, chronic knee  pain.  REVIEW OF SYSTEMS: Full 14 system review of systems performed and negative with exception of: As noted in the HPI   ALLERGIES: Allergies  Allergen Reactions   Sulfamethoxazole-Trimethoprim Hives and Rash   Venlafaxine Hives, Itching and Other (See Comments)    Headache   Imitrex [Sumatriptan]     hypertension   Morphine And Codeine Other (See Comments)    Hyper, confusion     HOME MEDICATIONS: Outpatient Medications Prior to Visit  Medication Sig Dispense Refill   acetaminophen  (TYLENOL ) 500 MG tablet Take 2 tablets (1,000 mg total) by mouth every 6 (six) hours as needed.     amoxicillin (AMOXIL) 875 MG tablet Take 875 mg by mouth 2 (two) times daily.     cetirizine (ZYRTEC) 10 MG chewable tablet Chew 10 mg by mouth daily.     Multiple Vitamin (MULTIVITAMIN) tablet Take 1 tablet by mouth daily.     nortriptyline  (PAMELOR ) 25 MG capsule Take 25 mg by mouth at bedtime.     levETIRAcetam  (KEPPRA ) 750 MG tablet Take 1 tablet (750 mg total) by mouth 2 (two) times daily. 60 tablet 0   Cholecalciferol  (VITAMIN D3) 2000 units capsule Take 2,000 Units by mouth daily.     Cyanocobalamin  (VITAMIN B 12) 100 MCG LOZG Take 100 mcg by mouth daily.     cyclobenzaprine  (FLEXERIL ) 10 MG tablet Take 1 tablet (10 mg total) by mouth 2 (two) times daily as needed for muscle spasms. 20 tablet 0   Ginger 500 MG CAPS Take 500 mg by mouth daily.     lisinopril  (ZESTRIL ) 5 MG tablet Take 1 tablet (5 mg total) by mouth daily. 30 tablet 0   lisinopril -hydrochlorothiazide  (PRINZIDE ,ZESTORETIC ) 10-12.5 MG tablet Take 0.5 tablets by mouth daily.     Omega-3 Fatty Acids (FISH OIL) 500 MG CAPS Take 1,000 mg by mouth daily.     ondansetron  (ZOFRAN ) 4 MG tablet Take 1 tablet (4 mg total) by mouth every 8 (eight) hours as needed for nausea or vomiting. 20 tablet 0   pantoprazole  (PROTONIX ) 20 MG tablet Take 1 tablet (20 mg total) by mouth daily. 30 tablet 0   predniSONE  (DELTASONE ) 10 MG tablet Take 5 mg by  mouth daily.     tamoxifen  (NOLVADEX ) 20 MG tablet Take 20 mg by mouth at bedtime.     Turmeric 500 MG CAPS Take 500 mg by mouth daily.     No facility-administered medications prior to visit.    PAST MEDICAL HISTORY: Past Medical History:  Diagnosis Date   Anxiety    Breast cancer (HCC)    Hypertension    PMR (polymyalgia rheumatica) (HCC)    Pneumonia     PAST SURGICAL HISTORY: History reviewed. No pertinent surgical history.  FAMILY HISTORY: History reviewed. No pertinent family history.  SOCIAL HISTORY: Social History   Socioeconomic History   Marital status: Widowed    Spouse name: Not on file   Number of children: Not on file   Years of education: Not on file   Highest education level: Not on file  Occupational History   Not on file  Tobacco Use   Smoking status: Never   Smokeless tobacco: Never  Vaping Use   Vaping status: Never Used  Substance and Sexual Activity  Alcohol use: No   Drug use: Never   Sexual activity: Not on file  Other Topics Concern   Not on file  Social History Narrative   Not on file   Social Drivers of Health   Financial Resource Strain: Not on file  Food Insecurity: No Food Insecurity (09/04/2023)   Hunger Vital Sign    Worried About Running Out of Food in the Last Year: Never true    Ran Out of Food in the Last Year: Never true  Transportation Needs: Patient Unable To Answer (09/04/2023)   PRAPARE - Transportation    Lack of Transportation (Medical): Patient unable to answer    Lack of Transportation (Non-Medical): Patient unable to answer  Physical Activity: Not on file  Stress: Not on file  Social Connections: Patient Unable To Answer (09/04/2023)   Social Connection and Isolation Panel [NHANES]    Frequency of Communication with Friends and Family: Patient unable to answer    Frequency of Social Gatherings with Friends and Family: Patient unable to answer    Attends Religious Services: Patient unable to answer    Active  Member of Clubs or Organizations: Patient unable to answer    Attends Banker Meetings: Patient unable to answer    Marital Status: Patient unable to answer  Intimate Partner Violence: Not At Risk (09/04/2023)   Humiliation, Afraid, Rape, and Kick questionnaire    Fear of Current or Ex-Partner: No    Emotionally Abused: No    Physically Abused: No    Sexually Abused: No    PHYSICAL EXAM   GENERAL EXAM/CONSTITUTIONAL: Vitals:  Vitals:   09/16/23 1135  BP: 114/70  Pulse: 79  Weight: 197 lb 8 oz (89.6 kg)  Height: 5\' 4"  (1.626 m)   Body mass index is 33.9 kg/m. Wt Readings from Last 3 Encounters:  09/16/23 197 lb 8 oz (89.6 kg)  09/04/23 207 lb 0.2 oz (93.9 kg)  11/07/20 200 lb (90.7 kg)   Patient is in no distress; well developed, nourished and groomed; neck is supple  MUSCULOSKELETAL: Gait, strength, tone, movements noted in Neurologic exam below  NEUROLOGIC: MENTAL STATUS:      No data to display         awake, alert, oriented to person, place and time recent and remote memory intact normal attention and concentration language fluent, comprehension intact, naming intact fund of knowledge appropriate  CRANIAL NERVE:  2nd, 3rd, 4th, 6th - Visual fields full to confrontation, extraocular muscles intact, no nystagmus 5th - facial sensation symmetric 7th - facial strength symmetric 8th - hearing intact 9th - palate elevates symmetrically, uvula midline 11th - shoulder shrug symmetric 12th - tongue protrusion midline  MOTOR:  normal bulk and tone, full strength in the BUE, BLE  SENSORY:  normal and symmetric to light touch  COORDINATION:  finger-nose-finger, fine finger movements normal  GAIT/STATION:  Antalgic gait    DIAGNOSTIC DATA (LABS, IMAGING, TESTING) - I reviewed patient records, labs, notes, testing and imaging myself where available.  Lab Results  Component Value Date   WBC 7.3 09/04/2023   HGB 13.8 09/04/2023   HCT 40.6  09/04/2023   MCV 85.3 09/04/2023   PLT 251 09/04/2023      Component Value Date/Time   NA 139 09/07/2023 0634   K 3.6 09/07/2023 0634   CL 107 09/07/2023 0634   CO2 24 09/07/2023 0634   GLUCOSE 93 09/07/2023 0634   BUN 22 09/07/2023 0634   CREATININE 0.98 09/07/2023  4098   CALCIUM 9.7 09/07/2023 0634   PROT 7.7 09/03/2023 1042   ALBUMIN 3.8 09/03/2023 1042   AST 37 09/03/2023 1042   ALT 27 09/03/2023 1042   ALKPHOS 71 09/03/2023 1042   BILITOT 0.6 09/03/2023 1042   GFRNONAA 59 (L) 09/07/2023 0634   GFRAA >60 10/31/2019 1714   Lab Results  Component Value Date   CHOL 157 09/04/2023   HDL 57 09/04/2023   LDLCALC 87 09/04/2023   TRIG 66 09/04/2023   Lab Results  Component Value Date   HGBA1C 5.6 09/04/2023   Lab Results  Component Value Date   VITAMINB12 488 09/04/2023   Lab Results  Component Value Date   TSH 0.615 09/04/2023    MRI Brain 09/03/2023 1. Possible punctate acute infarct within the left periatrial white matter (versus volume averaging of adjacent choroid plexus). 2. Background mild cerebral white matter chronic small vessel ischemic disease. 3. Mild generalized cerebral atrophy. 4. Minor paranasal sinus mucosal thickening  EEG 09/04/2023 - Continuous slow, left fronto-temporal region   I personally reviewed brain Images and previous EEG reports.   ASSESSMENT AND PLAN  78 y.o. year old female  with history of polymyalgia rheumatica, hypertension, history of breast cancer, chronic pain who presenting after being admitted to the hospital for confusion and aphasia, diagnosed with focal epilepsy.  She is doing well on Keppra  750 mg twice daily, son tells me that her speech is back to normal.  Plan will be for patient to continue with Keppra  750 mg twice daily, she does have some side effect of somnolence.  If the somnolence continues, will likely decrease to 500 mg twice daily, otherwise we will continue patient on 750 mg twice daily.  I will see her in 6  months for follow-up.  Return sooner if worse.   1. Focal epilepsy [facility])     Patient Instructions  Continue with Keppra  750 mg twice daily  Continue your other medications  Continue to follow up with PCP  Return in 6 months or sooner if worse    Per [place]  DMV statutes, patients with seizures are not allowed to drive until they have been seizure-free for six months.  Other recommendations include using caution when using heavy equipment or power tools. Avoid working on ladders or at heights. Take showers instead of baths.  Do not swim alone.  Ensure the water temperature is not too high on the home water heater. Do not go swimming alone. Do not lock yourself in a room alone (i.e. bathroom). When caring for infants or small children, sit down when holding, feeding, or changing them to minimize risk of injury to the child in the event you have a seizure. Maintain good sleep hygiene. Avoid alcohol.  Also recommend adequate sleep, hydration, good diet and minimize stress.   During the Seizure  - First, ensure adequate ventilation and place patients on the floor on their left side  Loosen clothing around the neck and ensure the airway is patent. If the patient is clenching the teeth, do not force the mouth open with any object as this can cause severe damage - Remove all items from the surrounding that can be hazardous. The patient may be oblivious to what's happening and may not even know what he or she is doing. If the patient is confused and wandering, either gently guide him/her away and block access to outside areas - Reassure the individual and be comforting - Call 911. In most cases, the seizure  ends before EMS arrives. However, there are cases when seizures may last over 3 to 5 minutes. Or the individual may have developed breathing difficulties or severe injuries. If a pregnant patient or a person with diabetes develops a seizure, it is prudent to call an ambulance. - Finally, if  the patient does not regain full consciousness, then call EMS. Most patients will remain confused for about 45 to 90 minutes after a seizure, so you must use judgment in calling for help. - Avoid restraints but make sure the patient is in a bed with padded side rails - Place the individual in a lateral position with the neck slightly flexed; this will help the saliva drain from the mouth and prevent the tongue from falling backward - Remove all nearby furniture and other hazards from the area - Provide verbal assurance as the individual is regaining consciousness - Provide the patient with privacy if possible - Call for help and start treatment as ordered by the caregiver   After the Seizure (Postictal Stage)  After a seizure, most patients experience confusion, fatigue, muscle pain and/or a headache. Thus, one should permit the individual to sleep. For the next few days, reassurance is essential. Being calm and helping reorient the person is also of importance.  Most seizures are painless and end spontaneously. Seizures are not harmful to others but can lead to complications such as stress on the lungs, brain and the heart. Individuals with prior lung problems may develop labored breathing and respiratory distress.    Discussed Patients with epilepsy have a small risk of sudden unexpected death, a condition referred to as sudden unexpected death in epilepsy (SUDEP). SUDEP is defined specifically as the sudden, unexpected, witnessed or unwitnessed, nontraumatic and nondrowning death in patients with epilepsy with or without evidence for a seizure, and excluding documented status epilepticus, in which post mortem examination does not reveal a structural or toxicologic cause for death     No orders of the defined types were placed in this encounter.   Meds ordered this encounter  Medications   levETIRAcetam  (KEPPRA ) 750 MG tablet    Sig: Take 1 tablet (750 mg total) by mouth 2 (two) times  daily.    Dispense:  180 tablet    Refill:  3    Return in about 6 months (around 03/17/2024).  I have spent a total of 65 minutes dedicated to this patient today, preparing to see patient, performing a medically appropriate examination and evaluation, ordering tests and/or medications and procedures, and counseling and educating the patient/family/caregiver; independently interpreting result and communicating results to the family/patient/caregiver; and documenting clinical information in the electronic medical record.   Cassandra Cleveland, MD 09/16/2023, 9:35 PM  [facility] [address], [address] [place], [place] [address] [phone]

## 2023-09-16 NOTE — Patient Instructions (Signed)
 Continue with Keppra  750 mg twice daily  Continue your other medications  Continue to follow up with PCP  Return in 6 months or sooner if worse

## 2023-09-24 DIAGNOSIS — R053 Chronic cough: Secondary | ICD-10-CM | POA: Diagnosis not present

## 2023-09-24 DIAGNOSIS — J329 Chronic sinusitis, unspecified: Secondary | ICD-10-CM | POA: Diagnosis not present

## 2023-09-26 DIAGNOSIS — M1712 Unilateral primary osteoarthritis, left knee: Secondary | ICD-10-CM | POA: Diagnosis not present

## 2023-10-03 DIAGNOSIS — M1712 Unilateral primary osteoarthritis, left knee: Secondary | ICD-10-CM | POA: Diagnosis not present

## 2023-10-09 ENCOUNTER — Encounter: Payer: Self-pay | Admitting: Neurology

## 2023-10-10 ENCOUNTER — Other Ambulatory Visit: Payer: Self-pay | Admitting: Neurology

## 2023-10-10 ENCOUNTER — Telehealth: Payer: Self-pay

## 2023-10-10 DIAGNOSIS — M1712 Unilateral primary osteoarthritis, left knee: Secondary | ICD-10-CM | POA: Diagnosis not present

## 2023-10-10 MED ORDER — LEVETIRACETAM 500 MG PO TABS: 500.0000 mg | ORAL_TABLET | Freq: Two times a day (BID) | ORAL | 0 refills | Status: DC

## 2023-10-10 NOTE — Telephone Encounter (Signed)
 Please have patient decrease Keppra  to 500 mg twice daily. I new prescriptions.

## 2023-10-10 NOTE — Progress Notes (Signed)
 Somnolence with Keppra  750 mg BID, will decrease to 500 mg BID

## 2023-10-10 NOTE — Telephone Encounter (Signed)
 Call to pharmacy, spoke to Jayla, medication list reconciled. Call to patient no answer. Left message mychart was sent and to call back if needed

## 2023-10-11 ENCOUNTER — Other Ambulatory Visit: Payer: Self-pay | Admitting: Neurology

## 2023-10-11 MED ORDER — LEVETIRACETAM 750 MG PO TABS: 750.0000 mg | ORAL_TABLET | Freq: Two times a day (BID) | ORAL | 0 refills | Status: DC

## 2023-10-11 NOTE — Telephone Encounter (Signed)
 Ok to continue with Keppra  750 mg twice daily. We will need to clarify with pharmacy the correct dose. I am going to cancel the prescription for 500 mg.

## 2023-10-15 ENCOUNTER — Other Ambulatory Visit (HOSPITAL_COMMUNITY): Payer: Self-pay

## 2023-10-22 DIAGNOSIS — J019 Acute sinusitis, unspecified: Secondary | ICD-10-CM | POA: Diagnosis not present

## 2023-10-22 DIAGNOSIS — R0981 Nasal congestion: Secondary | ICD-10-CM | POA: Diagnosis not present

## 2023-10-22 DIAGNOSIS — R509 Fever, unspecified: Secondary | ICD-10-CM | POA: Diagnosis not present

## 2023-10-22 DIAGNOSIS — R5383 Other fatigue: Secondary | ICD-10-CM | POA: Diagnosis not present

## 2023-10-31 ENCOUNTER — Other Ambulatory Visit: Payer: Self-pay

## 2023-10-31 ENCOUNTER — Emergency Department (HOSPITAL_COMMUNITY)
Admission: EM | Admit: 2023-10-31 | Discharge: 2023-10-31 | Disposition: A | Discharge status: Home / Self Care | Attending: Emergency Medicine | Admitting: Emergency Medicine

## 2023-10-31 ENCOUNTER — Encounter (HOSPITAL_COMMUNITY): Payer: Self-pay

## 2023-10-31 ENCOUNTER — Emergency Department (HOSPITAL_COMMUNITY)

## 2023-10-31 DIAGNOSIS — I10 Essential (primary) hypertension: Secondary | ICD-10-CM | POA: Insufficient documentation

## 2023-10-31 DIAGNOSIS — Z853 Personal history of malignant neoplasm of breast: Secondary | ICD-10-CM | POA: Insufficient documentation

## 2023-10-31 DIAGNOSIS — R259 Unspecified abnormal involuntary movements: Secondary | ICD-10-CM | POA: Diagnosis not present

## 2023-10-31 DIAGNOSIS — Z79899 Other long term (current) drug therapy: Secondary | ICD-10-CM | POA: Diagnosis not present

## 2023-10-31 DIAGNOSIS — R519 Headache, unspecified: Secondary | ICD-10-CM | POA: Diagnosis not present

## 2023-10-31 DIAGNOSIS — G44209 Tension-type headache, unspecified, not intractable: Secondary | ICD-10-CM | POA: Diagnosis not present

## 2023-10-31 LAB — BASIC METABOLIC PANEL WITH GFR
Anion gap: 9 (ref 5–15)
BUN: 14 mg/dL (ref 8–23)
CO2: 24 mmol/L (ref 22–32)
Calcium: 9.4 mg/dL (ref 8.9–10.3)
Chloride: 108 mmol/L (ref 98–111)
Creatinine, Ser: 0.85 mg/dL (ref 0.44–1.00)
GFR, Estimated: >60 mL/min (ref >60)
Glucose, Bld: 129 mg/dL — ABNORMAL HIGH (ref 70–99)
Potassium: 3.7 mmol/L (ref 3.5–5.1)
Sodium: 141 mmol/L (ref 135–145)

## 2023-10-31 LAB — CBC WITH DIFFERENTIAL/PLATELET
Abs Immature Granulocytes: 0.01 10*3/uL (ref 0.00–0.07)
Basophils Absolute: 0 10*3/uL (ref 0.0–0.1)
Basophils Relative: 1 %
Eosinophils Absolute: 0.2 10*3/uL (ref 0.0–0.5)
Eosinophils Relative: 2 %
HCT: 43.8 % (ref 36.0–46.0)
Hemoglobin: 14.6 g/dL (ref 12.0–15.0)
Immature Granulocytes: 0 %
Lymphocytes Relative: 25 %
Lymphs Abs: 1.5 10*3/uL (ref 0.7–4.0)
MCH: 29.4 pg (ref 26.0–34.0)
MCHC: 33.3 g/dL (ref 30.0–36.0)
MCV: 88.1 fL (ref 80.0–100.0)
Monocytes Absolute: 0.6 10*3/uL (ref 0.1–1.0)
Monocytes Relative: 9 %
Neutro Abs: 3.9 10*3/uL (ref 1.7–7.7)
Neutrophils Relative %: 63 %
Platelets: 267 10*3/uL (ref 150–400)
RBC: 4.97 MIL/uL (ref 3.87–5.11)
RDW: 13.2 % (ref 11.5–15.5)
WBC: 6.2 10*3/uL (ref 4.0–10.5)
nRBC: 0 % (ref 0.0–0.2)

## 2023-10-31 LAB — SEDIMENTATION RATE: Sed Rate: 12 mm/h (ref 0–22)

## 2023-10-31 MED ORDER — METOCLOPRAMIDE HCL 5 MG/ML IJ SOLN
10.0000 mg | Freq: Once | INTRAMUSCULAR | Status: AC
  Administered 2023-10-31: 10 mg via INTRAVENOUS
  Filled 2023-10-31: qty 2

## 2023-10-31 MED ORDER — KETOROLAC TROMETHAMINE 30 MG/ML IJ SOLN
10.0000 mg | Freq: Once | INTRAMUSCULAR | Status: AC
  Administered 2023-10-31: 9.9 mg via INTRAVENOUS
  Filled 2023-10-31: qty 1

## 2023-10-31 MED ORDER — ACETAMINOPHEN 500 MG PO TABS
1000.0000 mg | ORAL_TABLET | Freq: Once | ORAL | Status: AC
  Administered 2023-10-31: 1000 mg via ORAL
  Filled 2023-10-31: qty 2

## 2023-10-31 NOTE — ED Provider Notes (Signed)
 [place] EMERGENCY DEPARTMENT AT [facility] Provider Note  CSN: [id number] Arrival date & time: 10/31/23 1742  Chief Complaint(s) Headache  HPI Melissa Sosa is a 78 y.o. female who is here today for headache on the left side of her forehead and temple.  Patient's symptoms began last evening.  Patient went to an urgent care, and they noticed that the patient had a some twitching so they sent her to the ED.  She has a history of focal seizures, takes Keppra , no changes in medications.   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 Authorizing Provider  acetaminophen  (TYLENOL ) 500 MG tablet Take 2 tablets (1,000 mg total) by mouth every 6 (six) hours as needed. 09/07/23   Carey Chapman, MD  amoxicillin (AMOXIL) 875 MG tablet Take 875 mg by mouth 2 (two) times daily.    [provider]  cetirizine (ZYRTEC) 10 MG chewable tablet Chew 10 mg by mouth daily.    [provider]  levETIRAcetam  (KEPPRA ) 750 MG tablet Take 1 tablet (750 mg total) by mouth 2 (two) times daily. 10/11/23 01/09/24  Camara, Amadou, MD  Multiple Vitamin (MULTIVITAMIN) tablet Take 1 tablet by mouth daily.    [provider]  nortriptyline  (PAMELOR ) 25 MG capsule Take 25 mg by mouth at bedtime.    [provider]                                                                                                                                     Past Surgical History History reviewed. No pertinent surgical history. Family History History reviewed. No pertinent family history.  Social History Social History   Tobacco Use   Smoking status: Never   Smokeless tobacco: Never  Vaping Use   Vaping status: Never Used  Substance Use Topics   Alcohol use: No   Drug use: Never   Allergies Sulfamethoxazole-trimethoprim, Venlafaxine, Imitrex [sumatriptan], and Morphine and codeine  Review of Systems Review of Systems  Physical Exam Vital Signs  I have reviewed the triage vital signs BP 129/68   Pulse 87   Temp 98.2 F (36.8 C)   Resp 18   SpO2 98%   Physical Exam Vitals and nursing note reviewed.   Eyes:     General: Visual  field deficit present.     Extraocular Movements: Extraocular movements intact.     Comments: No eye redness, pupils equal reactive.  Visual acuity grossly normal.   Cardiovascular:     Rate and Rhythm: Normal rate.  Pulmonary:     Effort: Pulmonary effort is normal.  Abdominal:     General: Bowel sounds are normal.     Palpations: Abdomen is soft.   Musculoskeletal:        General: Normal range of motion.   Skin:    General: Skin is warm.   Neurological:     Mental Status: She is alert.     Cranial Nerves: No cranial nerve deficit, dysarthria or facial asymmetry.     Sensory: No sensory deficit.     Gait: Gait normal.     ED Results and Treatments Labs (all labs ordered are listed, but only abnormal results are displayed) Labs Reviewed  BASIC METABOLIC PANEL WITH GFR - Abnormal; Notable for the following components:      Result Value   Glucose, Bld 129 (*)    All other components within normal limits  CBC WITH DIFFERENTIAL/PLATELET  SEDIMENTATION RATE  LEVETIRACETAM  LEVEL                                                                                                                          Radiology CT  Head Wo Contrast Result Date: 10/31/2023 CLINICAL DATA:  Headache, tension-type. Pressure behind the left eye. EXAM: CT HEAD WITHOUT CONTRAST TECHNIQUE: Contiguous axial images were obtained from the base of the skull through the vertex without intravenous contrast. RADIATION DOSE REDUCTION: This exam was performed according to the departmental dose-optimization program which includes automated exposure control, adjustment of the mA and/or kV according to patient size and/or use of iterative reconstruction technique. COMPARISON:  CT head 09/04/2023. FINDINGS: Brain: No acute intracranial hemorrhage. No CT evidence of acute infarct. No edema, mass effect, or midline shift. The basilar cisterns are patent. Ventricles: The ventricles are normal. Vascular: No hyperdense vessel or unexpected calcification. Skull: No acute or aggressive finding. Orbits: Orbits are symmetric. Sinuses: The visualized paranasal sinuses are clear. Other: Trace fluid in left mastoid air cells. IMPRESSION: No CT evidence of acute intracranial abnormality. Electronically Signed   By: Denny Flack M.D.   On: 10/31/2023 18:51    Pertinent labs & imaging results that were available during my care of the patient were reviewed by me and considered in my medical decision making (see MDM for details).  Medications Ordered in ED Medications  metoCLOPramide (REGLAN) injection 10 mg (10 mg Intravenous Given 10/31/23 1935)  acetaminophen  (TYLENOL ) tablet 1,000 mg (1,000 mg Oral Given 10/31/23 1929)  ketorolac (TORADOL) 30 MG/ML injection 9.9 mg (9.9 mg Intravenous Given 10/31/23 1936)  Procedures Procedures  (including critical care time)  Medical Decision Making / ED Course   This patient presents to the ED for concern of pain over the left side of the head, this involves an extensive number of treatment  options, and is a complaint that carries with it a high risk of complications and morbidity.  The differential diagnosis includes migraine headache, sinus pain, temporal arteritis, less likely glaucoma, less likely CVA, sickly seizure, less likely SAH.  MDM: On exam, patient overall well-appearing.  She has no red flag symptoms for headache.  She endorsing some pain that she describes as being on the bone right above my left eye.  She does have some tenderness over her temple.  She has no pain in the globe of her eye, no redness to the eye, no visual disturbances.  Considered temporal arteritis in the patient.  Will order sed rate.  Is at higher risk with her history of polymyalgia rheumatica.  CT images patient's head negative.  I do not believe the patient has an ICH, do not believe she requires additional imaging beyond noncontrasted head CT.  Sinuses appear clear.  She tells me she does have a history of migraine headaches.  Will symptomatically treat the patient.  Regarding concern for twitching.  This does not sound like a seizure, is inconsistent with her previous episodes.  Reassessment 9:35 PM-CT head negative and her sed rate is negative.  Believe this is likely tension type headache versus migraine type headache.  Patient feeling significantly better following treatment.  Will discharge with PCP follow-up.   Additional history obtained: -Additional history obtained from family at bedside -External records from outside source obtained and reviewed including: Chart review including previous notes, labs, imaging, consultation notes   Lab Tests: -I ordered, reviewed, and interpreted labs.   The pertinent results include:   Labs Reviewed  BASIC METABOLIC PANEL WITH GFR - Abnormal; Notable for the following components:      Result Value   Glucose, Bld 129 (*)    All other components within normal limits  CBC WITH DIFFERENTIAL/PLATELET  SEDIMENTATION RATE  LEVETIRACETAM  LEVEL       Imaging Studies ordered: I ordered imaging studies including head CT I independently visualized and interpreted imaging. I agree with the radiologist interpretation   Medicines ordered and prescription drug management: Meds ordered this encounter  Medications   metoCLOPramide (REGLAN) injection 10 mg   acetaminophen  (TYLENOL ) tablet 1,000 mg   ketorolac (TORADOL) 30 MG/ML injection 9.9 mg    -I have reviewed the patients home medicines and have made adjustments as needed   Cardiac Monitoring: The patient was maintained on a cardiac monitor.  I personally viewed and interpreted the cardiac monitored which showed an underlying rhythm of: Normal sinus rhythm  Social Determinants of Health:  Factors impacting patients care include: Lack of access to primary care   Reevaluation: After the interventions noted above, I reevaluated the patient and found that they have :improved  Co morbidities that complicate the patient evaluation  Past Medical History:  Diagnosis Date   Anxiety    Breast cancer (HCC)    Hypertension    PMR (polymyalgia rheumatica) (HCC)    Pneumonia       Dispostion: I considered admission for this patient, however with her reassuring workup she is appropriate for discharge.     Final Clinical Impression(s) / ED Diagnoses Final diagnoses:  Acute nonintractable headache, unspecified headache type     @PCDICTATION @    Florie Husband,  Archie Koch, DO 10/31/23 2141

## 2023-10-31 NOTE — Discharge Instructions (Addendum)
 While you were in the emergency room, you have a head CT that was negative.  Your sinuses did not appear to have an infection.  You had blood work done that was overall normal.  Your sedimentation rate was negative.  It is very unlikely that you have temporal arteritis.  You may take Motrin  and Tylenol  at home.  Please follow-up with your primary care doctor.  Return to the emergency room for new or worsening headache, trouble with your vision or difficulty with your balance.

## 2023-10-31 NOTE — ED Provider Triage Note (Signed)
 Emergency Medicine Provider Triage Evaluation Note  Melissa Sosa , a 78 y.o. female  was evaluated in triage.  Pt complains of Headache.  Reports pain and pressure behind the left eye that started earlier today.  No fever or vision changes neck stiffness chest pain shortness of breath focal numbness focal weakness or rash.  Was seen at urgent care center when the provider noticed some mild twitching of the left side of her face and recommend patient to come to ER.  She was previously diagnosed with seizure 2 months ago and currently on Keppra .  Review of Systems  Positive: As above Negative: As above  Physical Exam  BP (!) 160/105 (BP Location: Right Arm)   Pulse (!) 101   Temp 98.5 F (36.9 C)   Resp 18   SpO2 97%  Gen:   Awake, no distress   Resp:  Normal effort  MSK:   Moves extremities without difficulty  Other:    Medical Decision Making  Medically screening exam initiated at 6:03 PM.  Appropriate orders placed.  Melissa Sosa was informed that the remainder of the evaluation will be completed by another provider, this initial triage assessment does not replace that evaluation, and the importance of remaining in the ED until their evaluation is complete.     Debbra Fairy, PA-C 10/31/23 1805

## 2023-10-31 NOTE — ED Triage Notes (Signed)
 Pt is coming in for left sided temple and eye pain that started lasting, she went to the walk in clinic today to get evaluated for it and they sent her here. She has no focal deficits, ano unilateral weakness and no numbness. The pain in her eye is can be palpated a as well. She is otherwise stable and has been taking all of her blood pressure/seizure medications as usual.

## 2023-10-31 NOTE — ED Notes (Signed)
 Pt aware we need urine sample. Provided with hat and urine cup and instructed to call for staff when ready to use the restroom.

## 2023-10-31 NOTE — ED Notes (Signed)
 Awaiting patient from lobby.

## 2023-11-02 LAB — LEVETIRACETAM LEVEL: Levetiracetam Lvl: 21.1 ug/mL (ref 10.0–40.0)

## 2023-11-05 ENCOUNTER — Ambulatory Visit (INDEPENDENT_AMBULATORY_CARE_PROVIDER_SITE_OTHER): Admitting: Otolaryngology

## 2023-11-05 VITALS — BP 136/78 | HR 96 | Ht 64.0 in | Wt 197.0 lb

## 2023-11-05 DIAGNOSIS — J310 Chronic rhinitis: Secondary | ICD-10-CM

## 2023-11-05 DIAGNOSIS — R519 Headache, unspecified: Secondary | ICD-10-CM | POA: Diagnosis not present

## 2023-11-05 DIAGNOSIS — J343 Hypertrophy of nasal turbinates: Secondary | ICD-10-CM

## 2023-11-05 DIAGNOSIS — R0981 Nasal congestion: Secondary | ICD-10-CM

## 2023-11-06 DIAGNOSIS — R519 Headache, unspecified: Secondary | ICD-10-CM | POA: Insufficient documentation

## 2023-11-06 DIAGNOSIS — J310 Chronic rhinitis: Secondary | ICD-10-CM | POA: Insufficient documentation

## 2023-11-06 DIAGNOSIS — J343 Hypertrophy of nasal turbinates: Secondary | ICD-10-CM | POA: Insufficient documentation

## 2023-11-06 NOTE — Progress Notes (Signed)
 Patient ID: Melissa Sosa, female   DOB: 13-May-1946, 78 y.o.   MRN: [id number]  New complaints: Recurrent headaches, forehead pain  HPI: The patient is a 78 year old female who presents today complaining of recurrent headaches and facial pain over the past few months.  The patient was last seen in March 2024.  At that time, she was noted to have chronic rhinitis with nasal mucosal congestion and bilateral inferior turbinate hypertrophy.  She was treated with Flonase nasal spray.  The patient returns today complaining of increasing headaches and facial pain.  She has been symptomatic for several months.  She attributes her headaches and facial pain to recurrent sinusitis.  She was seen at the [facility] health emergency room last week.  Her head CT scan was negative.  No acute or chronic sinusitis was noted on her CT scan.  She denies any fever or visual change.  Exam: General: Communicates without difficulty, well nourished, no acute distress. Head: Normocephalic, no evidence injury, no tenderness, facial buttresses intact without stepoff. Face/sinus: No tenderness to palpation and percussion. Facial movement is normal and symmetric. Eyes: PERRL, EOMI. No scleral icterus, conjunctivae clear. Neuro: CN II exam reveals vision grossly intact.  No nystagmus at any point of gaze. Ears: Auricles well formed without lesions.  Ear canals are intact without mass or lesion.  No erythema or edema is appreciated.  The TMs are intact without fluid. Nose: External evaluation reveals normal support and skin without lesions.  Dorsum is intact.  Anterior rhinoscopy reveals mildly congested mucosa over anterior aspect of inferior turbinates and intact septum.  No purulence noted. Oral:  Oral cavity and oropharynx are intact, symmetric, without erythema or edema.  Mucosa is moist without lesions. Neck: Full range of motion without pain.  There is no significant lymphadenopathy.  No masses palpable.  Thyroid  bed within normal limits to  palpation.  Parotid glands and submandibular glands equal bilaterally without mass.  Trachea is midline. Neuro:  CN 2-12 grossly intact.   Assessment: 1.  Mild chronic rhinitis with mild nasal mucosal congestion and bilateral inferior turbinate hypertrophy. 2.  No acute or chronic infection is noted on today's examination and her CT scan from last week. 3.  Her recurrent headaches and facial pain are likely neurogenic in etiology.  Plan: 1.  The physical exam findings and the CT results are reviewed with the patient. 2.  The patient is reassured that no acute or chronic sinusitis is noted. 3.  The patient is encouraged to see her neurologist regarding further evaluation and treatment of her headaches. 4.  Continue the use of Flonase as needed. 5.  The patient is encouraged to call with any questions or concerns.

## 2023-11-28 DIAGNOSIS — J069 Acute upper respiratory infection, unspecified: Secondary | ICD-10-CM | POA: Diagnosis not present

## 2023-12-02 DIAGNOSIS — R0602 Shortness of breath: Secondary | ICD-10-CM | POA: Diagnosis not present

## 2023-12-02 DIAGNOSIS — R509 Fever, unspecified: Secondary | ICD-10-CM | POA: Diagnosis not present

## 2023-12-02 DIAGNOSIS — R051 Acute cough: Secondary | ICD-10-CM | POA: Diagnosis not present

## 2023-12-03 ENCOUNTER — Telehealth: Payer: Self-pay | Admitting: Neurology

## 2023-12-03 NOTE — Telephone Encounter (Addendum)
 Pt sattes on yesterday she was diagnosed with pnemonia, one thing suggested for her to take over the counter is mucinex 600mg  guaifenesin.  The provider she saw at Urgent care told her to check with her Neurologist to see what he says about her taking mucinex 600mg  guaifenesin with her seizure medication.  Pt purchased the mucinex 600mg  guaifenesin but has not taken it, please call pt on her cell [phone]

## 2023-12-03 NOTE — Telephone Encounter (Addendum)
 Call to patient, recently diagnosed with Pneumonia. advised that Dr. Gregg advises other the counter medications for cold symptoms and to avoid decongestants. She verbalized understanding. Reviewed seizure triggers and staying rested and hydrated

## 2023-12-09 ENCOUNTER — Observation Stay (HOSPITAL_COMMUNITY)
Admission: EM | Admit: 2023-12-09 | Discharge: 2023-12-10 | Disposition: A | Discharge status: Home / Self Care | Attending: Internal Medicine | Admitting: Internal Medicine

## 2023-12-09 ENCOUNTER — Other Ambulatory Visit: Payer: Self-pay

## 2023-12-09 ENCOUNTER — Encounter (HOSPITAL_COMMUNITY): Payer: Self-pay

## 2023-12-09 ENCOUNTER — Emergency Department (HOSPITAL_COMMUNITY)

## 2023-12-09 DIAGNOSIS — Z853 Personal history of malignant neoplasm of breast: Secondary | ICD-10-CM | POA: Insufficient documentation

## 2023-12-09 DIAGNOSIS — M353 Polymyalgia rheumatica: Secondary | ICD-10-CM | POA: Diagnosis not present

## 2023-12-09 DIAGNOSIS — Z7901 Long term (current) use of anticoagulants: Secondary | ICD-10-CM | POA: Diagnosis not present

## 2023-12-09 DIAGNOSIS — I447 Left bundle-branch block, unspecified: Secondary | ICD-10-CM | POA: Insufficient documentation

## 2023-12-09 DIAGNOSIS — I5031 Acute diastolic (congestive) heart failure: Secondary | ICD-10-CM

## 2023-12-09 DIAGNOSIS — Z9013 Acquired absence of bilateral breasts and nipples: Secondary | ICD-10-CM | POA: Insufficient documentation

## 2023-12-09 DIAGNOSIS — E876 Hypokalemia: Secondary | ICD-10-CM | POA: Diagnosis not present

## 2023-12-09 DIAGNOSIS — E66811 Obesity, class 1: Secondary | ICD-10-CM | POA: Insufficient documentation

## 2023-12-09 DIAGNOSIS — D509 Iron deficiency anemia, unspecified: Secondary | ICD-10-CM | POA: Diagnosis not present

## 2023-12-09 DIAGNOSIS — Z8572 Personal history of non-Hodgkin lymphomas: Secondary | ICD-10-CM | POA: Diagnosis not present

## 2023-12-09 DIAGNOSIS — Z6830 Body mass index (BMI) 30.0-30.9, adult: Secondary | ICD-10-CM | POA: Insufficient documentation

## 2023-12-09 DIAGNOSIS — G40909 Epilepsy, unspecified, not intractable, without status epilepticus: Secondary | ICD-10-CM | POA: Diagnosis not present

## 2023-12-09 DIAGNOSIS — Z79899 Other long term (current) drug therapy: Secondary | ICD-10-CM | POA: Insufficient documentation

## 2023-12-09 DIAGNOSIS — J189 Pneumonia, unspecified organism: Principal | ICD-10-CM | POA: Insufficient documentation

## 2023-12-09 DIAGNOSIS — R0602 Shortness of breath: Secondary | ICD-10-CM | POA: Diagnosis not present

## 2023-12-09 DIAGNOSIS — Z1152 Encounter for screening for COVID-19: Secondary | ICD-10-CM | POA: Insufficient documentation

## 2023-12-09 DIAGNOSIS — Z6832 Body mass index (BMI) 32.0-32.9, adult: Secondary | ICD-10-CM | POA: Diagnosis not present

## 2023-12-09 DIAGNOSIS — J90 Pleural effusion, not elsewhere classified: Secondary | ICD-10-CM | POA: Diagnosis not present

## 2023-12-09 DIAGNOSIS — I499 Cardiac arrhythmia, unspecified: Secondary | ICD-10-CM | POA: Diagnosis not present

## 2023-12-09 DIAGNOSIS — G43909 Migraine, unspecified, not intractable, without status migrainosus: Secondary | ICD-10-CM | POA: Insufficient documentation

## 2023-12-09 DIAGNOSIS — I4891 Unspecified atrial fibrillation: Secondary | ICD-10-CM | POA: Diagnosis not present

## 2023-12-09 DIAGNOSIS — R0989 Other specified symptoms and signs involving the circulatory and respiratory systems: Secondary | ICD-10-CM | POA: Diagnosis not present

## 2023-12-09 DIAGNOSIS — I110 Hypertensive heart disease with heart failure: Secondary | ICD-10-CM | POA: Insufficient documentation

## 2023-12-09 DIAGNOSIS — Z8540 Personal history of malignant neoplasm of unspecified female genital organ: Secondary | ICD-10-CM | POA: Insufficient documentation

## 2023-12-09 DIAGNOSIS — I5030 Unspecified diastolic (congestive) heart failure: Secondary | ICD-10-CM | POA: Insufficient documentation

## 2023-12-09 DIAGNOSIS — J45909 Unspecified asthma, uncomplicated: Secondary | ICD-10-CM | POA: Insufficient documentation

## 2023-12-09 DIAGNOSIS — E669 Obesity, unspecified: Secondary | ICD-10-CM | POA: Insufficient documentation

## 2023-12-09 DIAGNOSIS — R918 Other nonspecific abnormal finding of lung field: Secondary | ICD-10-CM | POA: Diagnosis not present

## 2023-12-09 DIAGNOSIS — R569 Unspecified convulsions: Secondary | ICD-10-CM

## 2023-12-09 LAB — BASIC METABOLIC PANEL WITH GFR
Anion gap: 11 (ref 5–15)
BUN: 15 mg/dL (ref 8–23)
CO2: 21 mmol/L — ABNORMAL LOW (ref 22–32)
Calcium: 9.3 mg/dL (ref 8.9–10.3)
Chloride: 103 mmol/L (ref 98–111)
Creatinine, Ser: 0.85 mg/dL (ref 0.44–1.00)
GFR, Estimated: >60 mL/min (ref >60)
Glucose, Bld: 133 mg/dL — ABNORMAL HIGH (ref 70–99)
Potassium: 4.6 mmol/L (ref 3.5–5.1)
Sodium: 135 mmol/L (ref 135–145)

## 2023-12-09 LAB — RESP PANEL BY RT-PCR (RSV, FLU A&B, COVID)  RVPGX2
Influenza A by PCR: NEGATIVE
Influenza B by PCR: NEGATIVE
Resp Syncytial Virus by PCR: NEGATIVE
SARS Coronavirus 2 by RT PCR: NEGATIVE

## 2023-12-09 LAB — CBC WITH DIFFERENTIAL/PLATELET
Abs Immature Granulocytes: 0.03 K/uL (ref 0.00–0.07)
Basophils Absolute: 0.1 K/uL (ref 0.0–0.1)
Basophils Relative: 1 %
Eosinophils Absolute: 0 K/uL (ref 0.0–0.5)
Eosinophils Relative: 0 %
HCT: 42.2 % (ref 36.0–46.0)
Hemoglobin: 14 g/dL (ref 12.0–15.0)
Immature Granulocytes: 0 %
Lymphocytes Relative: 10 %
Lymphs Abs: 1.1 K/uL (ref 0.7–4.0)
MCH: 30 pg (ref 26.0–34.0)
MCHC: 33.2 g/dL (ref 30.0–36.0)
MCV: 90.6 fL (ref 80.0–100.0)
Monocytes Absolute: 1.2 K/uL — ABNORMAL HIGH (ref 0.1–1.0)
Monocytes Relative: 11 %
Neutro Abs: 8.6 K/uL — ABNORMAL HIGH (ref 1.7–7.7)
Neutrophils Relative %: 78 %
Platelets: 209 K/uL (ref 150–400)
RBC: 4.66 MIL/uL (ref 3.87–5.11)
RDW: 13.5 % (ref 11.5–15.5)
Smear Review: NORMAL
WBC: 11 K/uL — ABNORMAL HIGH (ref 4.0–10.5)
nRBC: 0 % (ref 0.0–0.2)

## 2023-12-09 LAB — T4, FREE: Free T4: 1.38 ng/dL — ABNORMAL HIGH (ref 0.61–1.12)

## 2023-12-09 LAB — TSH: TSH: 0.592 u[IU]/mL (ref 0.350–4.500)

## 2023-12-09 LAB — BRAIN NATRIURETIC PEPTIDE: B Natriuretic Peptide: 686.2 pg/mL — ABNORMAL HIGH (ref 0.0–100.0)

## 2023-12-09 MED ORDER — SODIUM CHLORIDE 0.9% FLUSH
3.0000 mL | Freq: Two times a day (BID) | INTRAVENOUS | Status: DC
  Administered 2023-12-09 – 2023-12-10 (×2): 3 mL via INTRAVENOUS

## 2023-12-09 MED ORDER — LEVETIRACETAM 750 MG PO TABS
750.0000 mg | ORAL_TABLET | Freq: Two times a day (BID) | ORAL | Status: DC
  Administered 2023-12-09 – 2023-12-10 (×2): 750 mg via ORAL
  Filled 2023-12-09 (×2): qty 1

## 2023-12-09 MED ORDER — NORTRIPTYLINE HCL 25 MG PO CAPS
25.0000 mg | ORAL_CAPSULE | Freq: Every day | ORAL | Status: DC
  Administered 2023-12-09: 25 mg via ORAL
  Filled 2023-12-09 (×2): qty 1

## 2023-12-09 MED ORDER — FUROSEMIDE 10 MG/ML IJ SOLN
20.0000 mg | Freq: Once | INTRAMUSCULAR | Status: AC
  Administered 2023-12-09: 20 mg via INTRAVENOUS
  Filled 2023-12-09: qty 2

## 2023-12-09 MED ORDER — METOPROLOL TARTRATE 25 MG PO TABS
25.0000 mg | ORAL_TABLET | Freq: Two times a day (BID) | ORAL | Status: DC
  Administered 2023-12-10: 25 mg via ORAL
  Filled 2023-12-09: qty 1

## 2023-12-09 MED ORDER — LEVALBUTEROL HCL 0.63 MG/3ML IN NEBU
0.6300 mg | INHALATION_SOLUTION | Freq: Four times a day (QID) | RESPIRATORY_TRACT | Status: DC | PRN
  Administered 2023-12-10: 0.63 mg via RESPIRATORY_TRACT
  Filled 2023-12-09: qty 3

## 2023-12-09 MED ORDER — HEPARIN (PORCINE) 25000 UT/250ML-% IV SOLN
1200.0000 [IU]/h | INTRAVENOUS | Status: DC
  Administered 2023-12-09: 1200 [IU]/h via INTRAVENOUS
  Filled 2023-12-09: qty 250

## 2023-12-09 MED ORDER — ACETAMINOPHEN 500 MG PO TABS
1000.0000 mg | ORAL_TABLET | Freq: Four times a day (QID) | ORAL | Status: DC | PRN
  Administered 2023-12-10: 1000 mg via ORAL
  Filled 2023-12-09: qty 2

## 2023-12-09 MED ORDER — SODIUM CHLORIDE 0.9 % IV SOLN
1.0000 g | Freq: Once | INTRAVENOUS | Status: AC
  Administered 2023-12-09: 1 g via INTRAVENOUS
  Filled 2023-12-09: qty 10

## 2023-12-09 MED ORDER — DILTIAZEM HCL-DEXTROSE 125-5 MG/125ML-% IV SOLN (PREMIX)
5.0000 mg/h | INTRAVENOUS | Status: DC
  Administered 2023-12-09: 5 mg/h via INTRAVENOUS
  Filled 2023-12-09: qty 125

## 2023-12-09 MED ORDER — DILTIAZEM HCL 25 MG/5ML IV SOLN
15.0000 mg | Freq: Once | INTRAVENOUS | Status: AC
  Administered 2023-12-09: 15 mg via INTRAVENOUS
  Filled 2023-12-09: qty 5

## 2023-12-09 MED ORDER — SODIUM CHLORIDE 0.9 % IV SOLN
500.0000 mg | Freq: Once | INTRAVENOUS | Status: AC
  Administered 2023-12-09: 500 mg via INTRAVENOUS
  Filled 2023-12-09: qty 5

## 2023-12-09 MED ORDER — ONDANSETRON HCL 4 MG/2ML IJ SOLN: 4.0000 mg | Freq: Four times a day (QID) | INTRAMUSCULAR | Status: DC | PRN

## 2023-12-09 MED ORDER — MELATONIN 3 MG PO TABS: 6.0000 mg | ORAL_TABLET | Freq: Every evening | ORAL | Status: DC | PRN

## 2023-12-09 MED ORDER — HEPARIN BOLUS VIA INFUSION
4000.0000 [IU] | Freq: Once | INTRAVENOUS | Status: AC
  Administered 2023-12-09: 4000 [IU] via INTRAVENOUS
  Filled 2023-12-09: qty 4000

## 2023-12-09 MED ORDER — POLYETHYLENE GLYCOL 3350 17 G PO PACK: 17.0000 g | PACK | Freq: Every day | ORAL | Status: DC | PRN

## 2023-12-09 NOTE — Progress Notes (Signed)
 PHARMACY - ANTICOAGULATION CONSULT NOTE  Pharmacy Consult for Heparin  Indication: atrial fibrillation  Allergies  Allergen Reactions   Sulfamethoxazole-Trimethoprim Hives and Rash   Venlafaxine Hives, Itching and Other (See Comments)    Headache   Imitrex [Sumatriptan]     hypertension   Morphine And Codeine Other (See Comments)    Hyper, confusion     Patient Measurements: Height: 5' 6 (167.6 cm) Weight: 87.1 kg (192 lb) IBW/kg (Calculated) : 59.3 HEPARIN  DW (KG): 78  Vital Signs: Temp: 98.2 F (36.8 C) (07/21 1934) BP: 110/64 (07/21 2300) Pulse Rate: 97 (07/21 2300)  Labs: Recent Labs    12/09/23 1736  HGB 14.0  HCT 42.2  PLT 209  CREATININE 0.85    Estimated Creatinine Clearance: 61.6 mL/min (by C-G formula based on SCr of 0.85 mg/dL).   Medical History: Past Medical History:  Diagnosis Date   Anxiety    Breast cancer (HCC)    Hypertension    PMR (polymyalgia rheumatica) (HCC)    Pneumonia     Medications:  See home med rec  Assessment: 78 y.o. F presents with SOB and tachycardia - found to be in afib. To begin heparin .. No AC PTA. CBC ok on admission.  Goal of Therapy:  Heparin  level 0.3-0.7 units/ml Monitor platelets by anticoagulation protocol: Yes   Plan:  Heparin  IV bolus 4000 units Heparin  gtt at 1200 units/hr Will f/u heparin  level in 8 hours Daily heparin  level and CBC  Vito Ralph, PharmD, BCPS Please see amion for complete clinical pharmacist phone list 12/09/2023,11:16 PM

## 2023-12-09 NOTE — H&P (Signed)
 History and Physical    Melissa Sosa DOB: October 22, 1945 DOA: 12/09/2023  PCP: Cristopher Bottcher, NP   Patient coming from: Home   Chief Complaint:  Chief Complaint  Patient presents with   Shortness of Breath    HPI:  Melissa Sosa is a 78 y.o. 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 Reports onset of symptoms around 2 weeks ago. At time had productive cough, myalgias, DOE, orthopnea. Was seen outpatient and ultimately Rx'd 7 day course of Augmentin and Doxycycline for CAP which she completed yesterday. Despite completing abx still with persistent DOE / orthopnea. However, her cough has improved. She does have mild swelling in both legs. No chest pain. No known hx of Afib or treatment for CHF. No issues with bleeding, no falls.    Review of Systems:  ROS complete and negative except as marked above   Allergies  Allergen Reactions   Sulfamethoxazole-Trimethoprim Hives and Rash   Venlafaxine Hives, Itching and Other (See Comments)    Headache   Imitrex [Sumatriptan]     hypertension   Morphine And Codeine Other (See Comments)    Hyper, confusion     Prior to Admission medications   Medication Sig Start Date End Date Taking? Authorizing Provider  acetaminophen  (TYLENOL ) 500 MG tablet Take 2 tablets (1,000 mg total) by mouth every 6 (six) hours as needed. 09/07/23  Yes Diona Perkins, MD  levETIRAcetam  (KEPPRA ) 750 MG tablet Take 1 tablet (750 mg total) by mouth 2 (two) times daily. 10/11/23 01/09/24 Yes Camara, Amadou, MD  Multiple Vitamin (MULTIVITAMIN) tablet Take 1 tablet by mouth daily.   Yes [provider]  nortriptyline  (PAMELOR ) 25 MG capsule Take 25 mg by mouth at bedtime.   Yes [provider]  amoxicillin (AMOXIL) 875 MG tablet Take 875  mg by mouth 2 (two) times daily.    [provider]    Past Medical History:  Diagnosis Date   Anxiety    Breast cancer (HCC)    Hypertension    PMR (polymyalgia rheumatica) (HCC)    Pneumonia     History reviewed. No pertinent surgical history.   reports that she has never smoked. She has never used smokeless tobacco. She reports that she does not drink alcohol and does not use drugs.  History reviewed. No pertinent family history.   Physical Exam: Vitals:   12/09/23 2235 12/09/23 2245 12/09/23 2254 12/09/23 2300  BP: 102/65 (!) 113/94  110/64  Pulse: (!) 139 (!) 123 95 97  Resp: (!) 31 (!) 33 (!) 24 (!) 30  Temp:      SpO2: 99% 99% 99% 96%  Weight:      Height:        Gen: Awake, alert, elderly  HEENT: tremor of voice  CV: Regular, normal S1, S2, no murmurs  Resp: Normal WOB, Poor air movement, and no adventitious sounds.  Abd: Flat, normoactive, nontender MSK: Symmetric, Trace to 1+ pitting edema.  Skin: No rashes or lesions to exposed skin  Neuro: Alert and interactive  Psych: euthymic, appropriate    Data review:   Labs reviewed, notable for:   Bicarb 21, no AG  BNP 686  WBC 11   Micro:  Results for orders placed or performed during the hospital encounter of 12/09/23  Resp panel by RT-PCR (RSV,  Flu A&B, Covid) Anterior Nasal Swab     Status: None   Collection Time: 12/09/23  5:36 PM   Specimen: Anterior Nasal Swab  Result Value Ref Range Status   SARS Coronavirus 2 by RT PCR NEGATIVE NEGATIVE Final   Influenza A by PCR NEGATIVE NEGATIVE Final   Influenza B by PCR NEGATIVE NEGATIVE Final    Comment: (NOTE) The Xpert Xpress SARS-CoV-2/FLU/RSV plus assay is intended as an aid in the diagnosis of influenza from Nasopharyngeal swab specimens and should not be used as a sole basis for treatment. Nasal washings and aspirates are unacceptable for Xpert Xpress SARS-CoV-2/FLU/RSV testing.  Fact Sheet for  Patients: BloggerCourse.com  Fact Sheet for Healthcare Providers: SeriousBroker.it  This test is not yet approved or cleared by the [place]  FDA and has been authorized for detection and/or diagnosis of SARS-CoV-2 by FDA under an Emergency Use Authorization (EUA). This EUA will remain in effect (meaning this test can be used) for the duration of the COVID-19 declaration under Section 564(b)(1) of the Act, 21 U.S.C. section 360bbb-3(b)(1), unless the authorization is terminated or revoked.     Resp Syncytial Virus by PCR NEGATIVE NEGATIVE Final    Comment: (NOTE) Fact Sheet for Patients: BloggerCourse.com  Fact Sheet for Healthcare Providers: SeriousBroker.it  This test is not yet approved or cleared by the [place]  FDA and has been authorized for detection and/or diagnosis of SARS-CoV-2 by FDA under an Emergency Use Authorization (EUA). This EUA will remain in effect (meaning this test can be used) for the duration of the COVID-19 declaration under Section 564(b)(1) of the Act, 21 U.S.C. section 360bbb-3(b)(1), unless the authorization is terminated or revoked.  Performed at [facility], [address] [place], [place] [address]     Imaging reviewed:  DG Chest 2 View Result Date: 12/09/2023 CLINICAL DATA:  Shortness of breath. EXAM: CHEST - 2 VIEW COMPARISON:  December 02, 2023 FINDINGS: The heart size and mediastinal contours are within normal limits. Low lung volumes are noted. Mild, stable left basilar scarring, atelectasis and/or infiltrate is seen. Very mild right basilar atelectasis is also noted. There is a small right pleural effusion. No pneumothorax is identified. The visualized skeletal structures are unremarkable. IMPRESSION: 1. Low lung volumes with mild, bibasilar scarring, atelectasis and/or infiltrate, left greater than right. 2. Small right pleural  effusion. Electronically Signed   By: Suzen Dials M.D.   On: 12/09/2023 18:58    EKG:  Personally reviewed Afib with RVR, rate 148. + LBBB which is new from prior. No overt ischemic changes    ED Course:  Treated with Diltiazem  15 mg IV x1, Ceftriaxone , Azithromycin      Assessment/Plan:  78 y.o. 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 Found to have symptomatic Afib with RVR, suspect background of HFpEF with mild exacerbation in setting of arrhythmia.   Persistent SOB/DOE, orthopnea - suspect symptomatic Afib, mild HF exacerbation, ?RAD contributing  Hx recent treatment for CAP  Treated witih 7 day course of Augmentin and Doxycycline. Cough has improved. On RA. Tachycardic in Afib with RVR. Exam with poor air movement. BNP 686. CXR with decreased volume, bibasilar scarring, atelectasis v infiltrate L > R. Small R pleural effusion. Suspect imaging findings from old bronchiectasis and mild edema / atelectasis.  -  See management of Afib, CHF, and RAD per below.  - Stop Abx, doubt ongoing infection  - Check RVP and procalcitonin   Afib, new onset, with RVR - now converted to SR  P/w rates into the 150s. EKG Afib and new LBBB. Last TTE 4/'25 with LVEF 55-60% mild concentric LVH, and Grade 2 diastolic dysfunction, mild LA dilation, no significant valvular disease. Suspect predisposed to afib with her echo changes suggesting hypertensive heart disease, and trigger with recent pneumonia. CHA2DS2-VASC is 5 (Age > 75, CHF, HTN).  - Started on Diltiazem  gtt, has converted to SR -> will switch to Metoprolol  25 mg BID with overlap then stop gtt.  - Start on heparin  pharmacy to dose, as long as remains stable should be able to transition to DOAC tomorrow.  Discussed risk / benefit of anticoagulation with family they are agreeable to starting.  - Echo   HFpEF with mild exacerbation  - Lasix  20 mg IV x 1 - Echo per above  - Daily weight, I/O, low Na diet.   ? Reactive airway disease  No known hx Asthma, COPD. Nonsmoker. Exam with poor air movement throughout - Xopenex  prn, IS  - Hold off on steroid as no associated Dx. May be reactive with recent pneumonia.   Chronic medical problems:  Oncologic Hx: Nonhodgkins lymphoma s/p chemo, hx uterine cancer s/p hysterectomy, DCIS both breasts s/p lumptectomy, XRT and hormonal therapy. Followup and surveillance with Onc outpatient.  Migraines: Noted, on Nortriptyline   Focal seizure: Continue home Keppra  BID  PMR: Not on medication  HTN: Not on antihypertensives  IDA: Not an active problem   Body mass index is 30.99 kg/m. Obesity class I would benefit from weight loss outpatient    DVT prophylaxis:  IV heparin  gtts Code Status:  Full Code Diet:  Diet Orders (From admission, onward)     Start     Ordered   12/09/23 2220  Diet Heart Room service appropriate? Yes; Fluid consistency: Thin  Diet effective now       Question Answer Comment  Room service appropriate? Yes   Fluid consistency: Thin      12/09/23 2221           Family Communication:  Yes discussed with son at bedside   Consults:  None   Admission status:   Inpatient, Step Down Unit  Severity of Illness: The appropriate patient status for this patient is INPATIENT. Inpatient status is judged to be reasonable and necessary in order to provide the required intensity of service to ensure the patient's safety. The patient's presenting symptoms, physical exam findings, and initial radiographic and laboratory data in the context of their chronic comorbidities is felt to place them at high risk for further clinical deterioration. Furthermore, it is not anticipated that the patient will be medically stable for discharge from the  hospital within 2 midnights of admission.   * I certify that at the point of admission it is my clinical judgment that the patient will require inpatient hospital care spanning beyond 2 midnights from the point of admission due to high intensity of service, high risk for further deterioration and high frequency of surveillance required.*   Dorn Dawson, MD [facility]  How to contact the [facility] Attending or Consulting provider 7A - 7P or covering provider during after hours 7P -7A, for this patient.  Check the care team in [facility] and look for a) attending/consulting [facility] provider listed and b) the [facility] team listed Log into [email or web address] and  use [facility] universal password to access. If you do not have the password, please contact the hospital operator. Locate the [facility] provider you are looking for under [facility] and page to a number that you can be directly reached. If you still have difficulty reaching the provider, please page the [facility] (Director on Call) for the Hospitalists listed on amion for assistance.  12/09/2023, 11:22 PM

## 2023-12-09 NOTE — ED Triage Notes (Signed)
 Pt to ED from PCP c/o [facility] worse with exertion and tachycardia. Pt reports was dx with pneumonia last Monday, and finished abx yesterday with minimal relief.

## 2023-12-09 NOTE — ED Notes (Signed)
Dr. Elayne Snare at bedside

## 2023-12-09 NOTE — ED Provider Notes (Signed)
 [place] EMERGENCY DEPARTMENT AT [facility] Provider Note   CSN: [id number] Arrival date & time: 12/09/23  1710     Patient presents with: Shortness of Breath   Melissa Sosa is a 78 y.o. female.  With a history of seizure disorder, PMR and breast cancer presents to the ED given concern for shortness of breath.  Patient was recently treated for pneumonia as an outpatient with antibiotics prescribed by her PCP.  She completed her outpatient course of antibiotics yesterday but still feels shortness of breath especially with minimal exertion.  Tachycardia as well.  Directed here from her PCP today given concern for A-fib with RVR.  She has no history of A-fib.  No chest pain fevers chills nausea or vomiting.  No seizure activity recently.  No anticoagulation    Shortness of Breath      Prior to Admission medications   Medication Sig Start Date End Date Taking? Authorizing Provider  acetaminophen  (TYLENOL ) 500 MG tablet Take 2 tablets (1,000 mg total) by mouth every 6 (six) hours as needed. 09/07/23  Yes Diona Perkins, MD  levETIRAcetam  (KEPPRA ) 750 MG tablet Take 1 tablet (750 mg total) by mouth 2 (two) times daily. 10/11/23 01/09/24 Yes Camara, Amadou, MD  Multiple Vitamin (MULTIVITAMIN) tablet Take 1 tablet by mouth daily.   Yes [provider]  nortriptyline  (PAMELOR ) 25 MG capsule Take 25 mg by mouth at bedtime.   Yes [provider]  amoxicillin (AMOXIL) 875 MG tablet Take 875 mg by mouth 2 (two) times daily. Patient not taking: Reported on 12/09/2023    [provider]    Allergies: Sulfamethoxazole-trimethoprim, Venlafaxine, Imitrex [sumatriptan], and Morphine and codeine    Review of Systems  Respiratory:  Positive for shortness of breath.     Updated Vital Signs BP 110/64   Pulse 97   Temp 98.2 F (36.8 C)   Resp (!) 30   Ht 5' 6 (1.676 m)   Wt 87.1 kg   SpO2 96%   BMI 30.99 kg/m   Physical Exam Vitals and nursing note  reviewed.  HENT:     Head: Normocephalic and atraumatic.  Eyes:     Pupils: Pupils are equal, round, and reactive to light.  Cardiovascular:     Rate and Rhythm: Tachycardia present. Rhythm irregular.  Pulmonary:     Effort: Pulmonary effort is normal.     Breath sounds: Normal breath sounds.  Abdominal:     Palpations: Abdomen is soft.     Tenderness: There is no abdominal tenderness.  Skin:    General: Skin is warm and dry.  Neurological:     Mental Status: She is alert.  Psychiatric:        Mood and Affect: Mood normal.     (all labs ordered are listed, but only abnormal results are displayed) Labs Reviewed  BASIC METABOLIC PANEL WITH GFR - Abnormal; Notable for the following components:      Result Value   CO2 21 (*)    Glucose, Bld 133 (*)    All other components within normal limits  CBC WITH DIFFERENTIAL/PLATELET - Abnormal; Notable for the following components:   WBC 11.0 (*)    Neutro Abs 8.6 (*)    Monocytes Absolute 1.2 (*)    All other components within normal limits  BRAIN NATRIURETIC PEPTIDE - Abnormal; Notable for the following components:   B Natriuretic Peptide 686.2 (*)    All other components within normal limits  T4, FREE -  Abnormal; Notable for the following components:   Free T4 1.38 (*)    All other components within normal limits  RESP PANEL BY RT-PCR (RSV, FLU A&B, COVID)  RVPGX2  RESPIRATORY PANEL BY PCR  TSH  BASIC METABOLIC PANEL WITH GFR  MAGNESIUM   PHOSPHORUS  TSH  PROCALCITONIN  HEPARIN  LEVEL (UNFRACTIONATED)  CBC    EKG: EKG Interpretation Date/Time:  Monday December 09 2023 20:40:09 EDT Ventricular Rate:  148 PR Interval:    QRS Duration:  132 QT Interval:  327 QTC Calculation: 514 R Axis:   28  Text Interpretation: Atrial fibrillation with rapid ventricular response Ventricular premature complex Left bundle branch block Confirmed by Pamella Sharper [phone]) on 12/09/2023 8:49:35 PM  Radiology: [facility] Chest 2 View Result Date:  12/09/2023 CLINICAL DATA:  Shortness of breath. EXAM: CHEST - 2 VIEW COMPARISON:  December 02, 2023 FINDINGS: The heart size and mediastinal contours are within normal limits. Low lung volumes are noted. Mild, stable left basilar scarring, atelectasis and/or infiltrate is seen. Very mild right basilar atelectasis is also noted. There is a small right pleural effusion. No pneumothorax is identified. The visualized skeletal structures are unremarkable. IMPRESSION: 1. Low lung volumes with mild, bibasilar scarring, atelectasis and/or infiltrate, left greater than right. 2. Small right pleural effusion. Electronically Signed   By: Suzen Dials M.D.   On: 12/09/2023 18:58     Procedures   Medications Ordered in the ED  sodium chloride  flush (NS) 0.9 % injection 3 mL (3 mLs Intravenous Given 12/09/23 2232)  levalbuterol  (XOPENEX ) nebulizer solution 0.63 mg (has no administration in time range)  acetaminophen  (TYLENOL ) tablet 1,000 mg (has no administration in time range)  melatonin tablet 6 mg (has no administration in time range)  ondansetron  (ZOFRAN ) injection 4 mg (has no administration in time range)  polyethylene glycol (MIRALAX  / GLYCOLAX ) packet 17 g (has no administration in time range)  diltiazem  (CARDIZEM ) 125 mg in dextrose  5% 125 mL (1 mg/mL) infusion (5 mg/hr Intravenous New Bag/Given 12/09/23 2235)  nortriptyline  (PAMELOR ) capsule 25 mg (25 mg Oral Self Administered 12/09/23 2226)  levETIRAcetam  (KEPPRA ) tablet 750 mg (750 mg Oral Self Administered 12/09/23 2226)  heparin  bolus via infusion 4,000 Units (has no administration in time range)  heparin  ADULT infusion 100 units/mL (25000 units/250mL) (has no administration in time range)  diltiazem  (CARDIZEM ) injection 15 mg (15 mg Intravenous Given 12/09/23 2056)  cefTRIAXone  (ROCEPHIN ) 1 g in sodium chloride  0.9 % 100 mL IVPB (0 g Intravenous Stopped 12/09/23 2134)  azithromycin  (ZITHROMAX ) 500 mg in sodium chloride  0.9 % 250 mL IVPB (0 mg  Intravenous Stopped 12/09/23 2311)  furosemide  (LASIX ) injection 20 mg (20 mg Intravenous Given 12/09/23 2328)                                    Medical Decision Making 78 year old female with history as above presented to the ED given concern for shortness of breath tachycardia.  New onset A-fib with RVR.  Pressure has been stable.  Rate up to 130s 140s on my assessment.  Considering she has no history of this the underlying trigger would certainly most likely be the pneumonia which she is treated for.  Redemonstration of pneumonia on chest x-ray today despite outpatient antibiotics.  Covered with ceftriaxone  and azithromycin  here for community-acquired pneumonia.  I gave 1 dose of diltiazem  to see if that would help with rate control but this was  ineffective.  Will plan to treat underlying cause with antibiotics and admit her to the hospitalist service for further care and monitoring.  Amount and/or Complexity of Data Reviewed Labs: ordered.  Risk Prescription drug management. Decision regarding hospitalization.        Final diagnoses:  Community acquired pneumonia, unspecified laterality  Atrial fibrillation with rapid ventricular response [facility])    ED Discharge Orders     None          Pamella Ozell LABOR, DO 12/09/23 2332

## 2023-12-09 NOTE — ED Provider Triage Note (Signed)
 Emergency Medicine Provider Triage Evaluation Note  Melissa Sosa , a 78 y.o. female  was evaluated in triage.  Pt complains of shortness of breath, diagnosed with double pneumonia at which reports finishing the antibiotic yesterday.  Continues to feel poorly.  Went to PCP office and they thought she was likely in A-fib, endorsing shortness of breath along with pain to her back.  Review of Systems  Positive: Shortness of breath, thoracic back pain, fever Negative: Leg swelling  Physical Exam  BP (!) 119/58 (BP Location: Right Arm)   Pulse (!) 115   Temp 98.9 F (37.2 C)   Resp (!) 27   Ht 5' 6 (1.676 m)   Wt 87.1 kg   SpO2 93%   BMI 30.99 kg/m  Gen:   Awake, no distress   Resp:  Normal effort  MSK:   Moves extremities without difficulty  Other:  Lungs are slightly diminished at the bases, no pitting edema.  Medical Decision Making  Medically screening exam initiated at 5:52 PM.  Appropriate orders placed.  Melissa Sosa was informed that the remainder of the evaluation will be completed by another provider, this initial triage assessment does not replace that evaluation, and the importance of remaining in the ED until their evaluation is complete.     Gahel Safley, PA-C 12/09/23 1752

## 2023-12-09 NOTE — ED Notes (Signed)
 V/o from Dr. Sharri to administer metoprolol  and titrate cardizem  to 2.5mg /hr

## 2023-12-09 NOTE — ED Notes (Signed)
 Dr. Pamella notified of pt heart rate and rhythm

## 2023-12-10 ENCOUNTER — Telehealth (HOSPITAL_COMMUNITY): Payer: Self-pay | Admitting: Pharmacy Technician

## 2023-12-10 ENCOUNTER — Other Ambulatory Visit (HOSPITAL_COMMUNITY): Payer: Self-pay

## 2023-12-10 ENCOUNTER — Inpatient Hospital Stay (HOSPITAL_BASED_OUTPATIENT_CLINIC_OR_DEPARTMENT_OTHER)

## 2023-12-10 DIAGNOSIS — E876 Hypokalemia: Secondary | ICD-10-CM | POA: Diagnosis not present

## 2023-12-10 DIAGNOSIS — I4891 Unspecified atrial fibrillation: Secondary | ICD-10-CM | POA: Diagnosis present

## 2023-12-10 DIAGNOSIS — I5032 Chronic diastolic (congestive) heart failure: Secondary | ICD-10-CM | POA: Diagnosis not present

## 2023-12-10 DIAGNOSIS — E66811 Obesity, class 1: Secondary | ICD-10-CM | POA: Diagnosis not present

## 2023-12-10 DIAGNOSIS — R569 Unspecified convulsions: Secondary | ICD-10-CM | POA: Diagnosis not present

## 2023-12-10 LAB — RESPIRATORY PANEL BY PCR

## 2023-12-10 LAB — BASIC METABOLIC PANEL WITH GFR
Anion gap: 11 (ref 5–15)
BUN: 14 mg/dL (ref 8–23)
CO2: 22 mmol/L (ref 22–32)
Calcium: 9.2 mg/dL (ref 8.9–10.3)
Chloride: 100 mmol/L (ref 98–111)
Creatinine, Ser: 0.98 mg/dL (ref 0.44–1.00)
GFR, Estimated: 59 mL/min — ABNORMAL LOW (ref >60)
Glucose, Bld: 145 mg/dL — ABNORMAL HIGH (ref 70–99)
Potassium: 3.4 mmol/L — ABNORMAL LOW (ref 3.5–5.1)
Sodium: 133 mmol/L — ABNORMAL LOW (ref 135–145)

## 2023-12-10 LAB — PROCALCITONIN: Procalcitonin: <0.1 ng/mL

## 2023-12-10 LAB — TSH: TSH: 0.799 u[IU]/mL (ref 0.350–4.500)

## 2023-12-10 LAB — CBC
HCT: 39.6 % (ref 36.0–46.0)
Hemoglobin: 12.8 g/dL (ref 12.0–15.0)
MCH: 29.9 pg (ref 26.0–34.0)
MCHC: 32.3 g/dL (ref 30.0–36.0)
MCV: 92.5 fL (ref 80.0–100.0)
Platelets: 216 K/uL (ref 150–400)
RBC: 4.28 MIL/uL (ref 3.87–5.11)
RDW: 13.6 % (ref 11.5–15.5)
WBC: 11.2 K/uL — ABNORMAL HIGH (ref 4.0–10.5)
nRBC: 0 % (ref 0.0–0.2)

## 2023-12-10 LAB — ECHOCARDIOGRAM COMPLETE
Area-P 1/2: 4.26 cm2
Height: 66 in
S' Lateral: 3.4 cm
Weight: 3072 [oz_av]

## 2023-12-10 LAB — MAGNESIUM: Magnesium: 1.6 mg/dL — ABNORMAL LOW (ref 1.7–2.4)

## 2023-12-10 LAB — LACTIC ACID, PLASMA
Lactic Acid, Venous: 2.1 mmol/L (ref 0.5–1.9)
Lactic Acid, Venous: 1.7 mmol/L (ref 0.5–1.9)

## 2023-12-10 LAB — HEPARIN LEVEL (UNFRACTIONATED): Heparin Unfractionated: 0.53 [IU]/mL (ref 0.30–0.70)

## 2023-12-10 LAB — PHOSPHORUS: Phosphorus: 2.8 mg/dL (ref 2.5–4.6)

## 2023-12-10 MED ORDER — METOPROLOL TARTRATE 12.5 MG HALF TABLET
12.5000 mg | ORAL_TABLET | Freq: Two times a day (BID) | ORAL | Status: DC
  Administered 2023-12-10: 12.5 mg via ORAL
  Filled 2023-12-10: qty 1

## 2023-12-10 MED ORDER — POTASSIUM CHLORIDE CRYS ER 20 MEQ PO TBCR
40.0000 meq | EXTENDED_RELEASE_TABLET | Freq: Once | ORAL | Status: AC
  Administered 2023-12-10: 40 meq via ORAL
  Filled 2023-12-10: qty 2

## 2023-12-10 MED ORDER — MAGNESIUM SULFATE 2 GM/50ML IV SOLN
2.0000 g | Freq: Once | INTRAVENOUS | Status: AC
  Administered 2023-12-10: 2 g via INTRAVENOUS
  Filled 2023-12-10: qty 50

## 2023-12-10 MED ORDER — METOPROLOL TARTRATE 25 MG PO TABS
12.5000 mg | ORAL_TABLET | Freq: Two times a day (BID) | ORAL | 0 refills | Status: DC
  Filled 2023-12-10: qty 90, 90d supply, fill #0

## 2023-12-10 MED ORDER — APIXABAN 5 MG PO TABS
5.0000 mg | ORAL_TABLET | ORAL | Status: AC
  Administered 2023-12-10: 5 mg via ORAL
  Filled 2023-12-10: qty 1

## 2023-12-10 MED ORDER — PERFLUTREN LIPID MICROSPHERE
1.0000 mL | INTRAVENOUS | Status: DC | PRN
  Administered 2023-12-10: 2 mL via INTRAVENOUS

## 2023-12-10 MED ORDER — APIXABAN 5 MG PO TABS
5.0000 mg | ORAL_TABLET | Freq: Two times a day (BID) | ORAL | 0 refills | Status: DC
  Filled 2023-12-10: qty 60, 30d supply, fill #0

## 2023-12-10 NOTE — Subjective & Objective (Signed)
 Pt seen and examined. Met with pt, her son and dtr-in-law at bedside. Has converted back to NSR. Echo shows LVEF 40-45%. Pt feels better. No SOB or DOE when walking to bathroom.

## 2023-12-10 NOTE — Plan of Care (Incomplete)
 Patient ID: Melissa Sosa, female   DOB: 05-13-1946, 78 y.o.   MRN: [id number]  Problem: Education: Goal: Knowledge of General Education information will improve Description: Including pain rating scale, medication(s)/side effects and non-pharmacologic comfort measures Outcome: Adequate for Discharge   Problem: Health Behavior/Discharge Planning: Goal: Ability to manage health-related needs will improve Outcome: Adequate for Discharge   Problem: Clinical Measurements: Goal: Ability to maintain clinical measurements within normal limits will improve Outcome: Adequate for Discharge Goal: Will remain free from infection Outcome: Adequate for Discharge Goal: Diagnostic test results will improve Outcome: Adequate for Discharge Goal: Respiratory complications will improve Outcome: Adequate for Discharge Goal: Cardiovascular complication will be avoided Outcome: Adequate for Discharge   Problem: Activity: Goal: Risk for activity intolerance will decrease Outcome: Adequate for Discharge   Problem: Nutrition: Goal: Adequate nutrition will be maintained Outcome: Adequate for Discharge   Problem: Coping: Goal: Level of anxiety will decrease Outcome: Adequate for Discharge   Problem: Elimination: Goal: Will not experience complications related to bowel motility Outcome: Adequate for Discharge Goal: Will not experience complications related to urinary retention Outcome: Adequate for Discharge   Problem: Pain Managment: Goal: General experience of comfort will improve and/or be controlled Outcome: Adequate for Discharge   Problem: Safety: Goal: Ability to remain free from injury will improve Outcome: Adequate for Discharge   Problem: Skin Integrity: Goal: Risk for impaired skin integrity will decrease Outcome: Adequate for Discharge    Verdie JONETTA Collier, RN

## 2023-12-10 NOTE — Assessment & Plan Note (Signed)
Body mass index is 32.31 kg/m.

## 2023-12-10 NOTE — Discharge Summary (Signed)
 [facility] Physician Discharge Summary   Patient name: Melissa Sosa  Admit date:     12/09/2023  Discharge date: 12/10/2023  Attending Physician: SEGARS, JONATHAN [[id number]]  Discharge Physician: Camellia Door   PCP: Cristopher Bottcher, NP  Admitted From: Home Disposition:  Home  Recommendations for Outpatient Follow-up:  Follow up with PCP in 1-2 weeks  Home Health:No Equipment/Devices: None    Discharge Condition:Stable CODE STATUS:FULL Diet recommendation: Regular Fluid Restriction: None  Hospital Summary: HPI: Melissa Sosa is a 78 y.o. 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 Reports onset of symptoms around 2 weeks ago. At time had productive cough, myalgias, DOE, orthopnea. Was seen outpatient and ultimately Rx'd 7 day course of Augmentin and Doxycycline for CAP which she completed yesterday. Despite completing abx still with persistent DOE / orthopnea. However, her cough has improved. She does have mild swelling in both legs. No chest pain. No known hx of Afib or treatment for CHF. No issues with bleeding, no falls.   Significant Events: Admitted 12/09/2023 for afib with RVR. Started on cardizem  gtts, IV heparin    Admission Labs: WBC 11, HgB 14, Pt 209 Na 135, K 4.6, CO2 of 21, BUN 15, scr 0.85, glu 133 Covid/flu/rsv negative BNP 686 TSH 0.592, FT4 1.38 Lactic acid 2.1 RVP negative  Admission Imaging Studies:   Significant Labs: Procalcitonin < 0.10  Significant Imaging Studies: CXR Low lung volumes with mild, bibasilar scarring, atelectasis and/or infiltrate, left greater than right. 2. Small right pleural effusion. Echo shows LVEF 40-45%  Antibiotic Therapy: Anti-infectives (From admission, onward)    Start     Dose/Rate Route Frequency  Ordered Stop   12/09/23 2100  cefTRIAXone  (ROCEPHIN ) 1 g in sodium chloride  0.9 % 100 mL IVPB        1 g 200 mL/hr over 30 Minutes Intravenous  Once 12/09/23 2051 12/09/23 2134   12/09/23 2100  azithromycin  (ZITHROMAX ) 500 mg in sodium chloride  0.9 % 250 mL IVPB        500 mg 250 mL/hr over 60 Minutes Intravenous  Once 12/09/23 2051 12/09/23 2311       Procedures:   Consultants:    Hospital Course by Problem: * Atrial fibrillation with RVR (HCC) 12-10-2023 converted to NSR. Will change to Eliquis . Obtain EKG to document resolution of afib and return to NSR. LVEF slightly down. EF 45%. Likely due to unknown afib. She can can repeat echo in 6-12 months to see if LVEF has recovered. Place on low dose lopressor  12.5 mg bid. Change to regular diet. Pt states she is not on BP meds anymore due to low BP at home. TSH is normal. CHAD-VASC score is 3. Pt agreeable to taking Eliquis .  Discussed with pt's son and dtr-in-law.  Reactive airway disease 12-10-2023 stable. On RA. No wheezing.  (HFpEF) heart failure with preserved ejection fraction (HCC) 12-10-2023 LVEF 40-45%. Stable. Pt was given one dose of IV lasix . On RA. No SOB or DOE. Does not need diuretics now that she is back in NSR. Home with lopressor  12.5 mg bid.  Seizures (HCC) 12-10-2023 continue with keppra . Stable.0  Hypomagnesemia 12-10-2023 repleted with IV mag.  Hypokalemia 12-10-2023 repleted with po kcl.  Obesity, Class I, BMI 30-34.9 Body mass index is 32.31 kg/m.     Discharge Diagnoses:  Principal Problem:  Atrial fibrillation with RVR (HCC) Active Problems:   Seizures (HCC)   (HFpEF) heart failure with preserved ejection fraction (HCC)   Reactive airway disease   Obesity, Class I, BMI 30-34.9   Hypokalemia   Hypomagnesemia   Rapid atrial fibrillation [facility])   Discharge Instructions  Discharge Instructions     Call MD for:  difficulty breathing, headache or visual disturbances   Complete by: As  directed    Call MD for:  extreme fatigue   Complete by: As directed    Call MD for:  hives   Complete by: As directed    Call MD for:  persistant dizziness or light-headedness   Complete by: As directed    Call MD for:  persistant nausea and vomiting   Complete by: As directed    Call MD for:  redness, tenderness, or signs of infection (pain, swelling, redness, odor or green/yellow discharge around incision site)   Complete by: As directed    Call MD for:  severe uncontrolled pain   Complete by: As directed    Call MD for:  temperature >100.4   Complete by: As directed    Diet general   Complete by: As directed    Discharge instructions   Complete by: As directed    1. Follow up with your primary care provider in 1-2 weeks following discharge from hospital.   Increase activity slowly   Complete by: As directed       Allergies as of 12/10/2023       Reactions   Sulfamethoxazole-trimethoprim Hives, Rash   Venlafaxine Hives, Itching, Other (See Comments)   Headache   Imitrex [sumatriptan]    hypertension   Morphine And Codeine Other (See Comments)   Hyper, confusion         Medication List     STOP taking these medications    amoxicillin 875 MG tablet Commonly known as: AMOXIL       TAKE these medications    acetaminophen  500 MG tablet Commonly known as: TYLENOL  Take 2 tablets (1,000 mg total) by mouth every 6 (six) hours as needed.   apixaban  5 MG Tabs tablet Commonly known as: ELIQUIS  Take 1 tablet (5 mg total) by mouth 2 (two) times daily.   levETIRAcetam  750 MG tablet Commonly known as: KEPPRA  Take 1 tablet (750 mg total) by mouth 2 (two) times daily.   metoprolol  tartrate 25 MG tablet Commonly known as: LOPRESSOR  Take 0.5 tablets (12.5 mg total) by mouth 2 (two) times daily.   multivitamin tablet Take 1 tablet by mouth daily.   nortriptyline  25 MG capsule Commonly known as: PAMELOR  Take 25 mg by mouth at bedtime.        Allergies   Allergen Reactions   Sulfamethoxazole-Trimethoprim Hives and Rash   Venlafaxine Hives, Itching and Other (See Comments)    Headache   Imitrex [Sumatriptan]     hypertension   Morphine And Codeine Other (See Comments)    Hyper, confusion     Discharge Exam: Vitals:   12/10/23 0946 12/10/23 1151  BP: 122/65 (!) 109/94  Pulse: 88 76  Resp: (!) 21   Temp: 98 F (36.7 C) 98.1 F (36.7 C)  SpO2: 96% 97%    Physical Exam Vitals and nursing note reviewed.  Constitutional:      General: She is not in acute distress.    Appearance: Normal appearance. She is not toxic-appearing or diaphoretic.  HENT:     Head: Normocephalic and atraumatic.  Nose: Nose normal.  Eyes:     General: No scleral icterus. Cardiovascular:     Rate and Rhythm: Normal rate and regular rhythm.     Heart sounds: No murmur heard. Pulmonary:     Effort: Pulmonary effort is normal.     Breath sounds: Normal breath sounds.  Abdominal:     General: Bowel sounds are normal. There is no distension.     Palpations: Abdomen is soft. There is no mass.     Tenderness: There is no abdominal tenderness. There is no guarding or rebound.  Musculoskeletal:     Right lower leg: No edema.     Left lower leg: No edema.  Skin:    General: Skin is warm and dry.     Capillary Refill: Capillary refill takes less than 2 seconds.  Neurological:     General: No focal deficit present.     Mental Status: She is alert and oriented to person, place, and time.     The results of significant diagnostics from this hospitalization (including imaging, microbiology, ancillary and laboratory) are listed below for reference.    Microbiology: Recent Results (from the past 240 hours)  Resp panel by RT-PCR (RSV, Flu A&B, Covid) Anterior Nasal Swab     Status: None   Collection Time: 12/09/23  5:36 PM   Specimen: Anterior Nasal Swab  Result Value Ref Range Status   SARS Coronavirus 2 by RT PCR NEGATIVE NEGATIVE Final   Influenza  A by PCR NEGATIVE NEGATIVE Final   Influenza B by PCR NEGATIVE NEGATIVE Final    Comment: (NOTE) The Xpert Xpress SARS-CoV-2/FLU/RSV plus assay is intended as an aid in the diagnosis of influenza from Nasopharyngeal swab specimens and should not be used as a sole basis for treatment. Nasal washings and aspirates are unacceptable for Xpert Xpress SARS-CoV-2/FLU/RSV testing.  Fact Sheet for Patients: BloggerCourse.com  Fact Sheet for Healthcare Providers: SeriousBroker.it  This test is not yet approved or cleared by the [place]  FDA and has been authorized for detection and/or diagnosis of SARS-CoV-2 by FDA under an Emergency Use Authorization (EUA). This EUA will remain in effect (meaning this test can be used) for the duration of the COVID-19 declaration under Section 564(b)(1) of the Act, 21 U.S.C. section 360bbb-3(b)(1), unless the authorization is terminated or revoked.     Resp Syncytial Virus by PCR NEGATIVE NEGATIVE Final    Comment: (NOTE) Fact Sheet for Patients: BloggerCourse.com  Fact Sheet for Healthcare Providers: SeriousBroker.it  This test is not yet approved or cleared by the [place]  FDA and has been authorized for detection and/or diagnosis of SARS-CoV-2 by FDA under an Emergency Use Authorization (EUA). This EUA will remain in effect (meaning this test can be used) for the duration of the COVID-19 declaration under Section 564(b)(1) of the Act, 21 U.S.C. section 360bbb-3(b)(1), unless the authorization is terminated or revoked.  Performed at [facility], [address], [place], [place] [address]   Respiratory (~20 pathogens) panel by PCR     Status: None   Collection Time: 12/10/23  3:43 AM   Specimen: Nasopharyngeal Swab; Respiratory  Result Value Ref Range Status   Adenovirus NOT DETECTED NOT DETECTED Final   Coronavirus 229E NOT  DETECTED NOT DETECTED Final    Comment: (NOTE) The Coronavirus on the Respiratory Panel, DOES NOT test for the novel  Coronavirus (2019 nCoV)    Coronavirus HKU1 NOT DETECTED NOT DETECTED Final   Coronavirus NL63 NOT DETECTED NOT DETECTED  Final   Coronavirus OC43 NOT DETECTED NOT DETECTED Final   Metapneumovirus NOT DETECTED NOT DETECTED Final   Rhinovirus / Enterovirus NOT DETECTED NOT DETECTED Final   Influenza A NOT DETECTED NOT DETECTED Final   Influenza B NOT DETECTED NOT DETECTED Final   Parainfluenza Virus 1 NOT DETECTED NOT DETECTED Final   Parainfluenza Virus 2 NOT DETECTED NOT DETECTED Final   Parainfluenza Virus 3 NOT DETECTED NOT DETECTED Final   Parainfluenza Virus 4 NOT DETECTED NOT DETECTED Final   Respiratory Syncytial Virus NOT DETECTED NOT DETECTED Final   Bordetella pertussis NOT DETECTED NOT DETECTED Final   Bordetella Parapertussis NOT DETECTED NOT DETECTED Final   Chlamydophila pneumoniae NOT DETECTED NOT DETECTED Final   Mycoplasma pneumoniae NOT DETECTED NOT DETECTED Final    Comment: Performed at [facility], [address] [place], [place] [address]     Labs: BNP (last 3 results) Recent Labs    12/09/23 1736  BNP 686.2*   Basic Metabolic Panel: Recent Labs  Lab 12/09/23 1736 12/10/23 0455  NA 135 133*  K 4.6 3.4*  CL 103 100  CO2 21* 22  GLUCOSE 133* 145*  BUN 15 14  CREATININE 0.85 0.98  CALCIUM 9.3 9.2  MG  --  1.6*  PHOS  --  2.8   CBC: Recent Labs  Lab 12/09/23 1736 12/10/23 0455  WBC 11.0* 11.2*  NEUTROABS 8.6*  --   HGB 14.0 12.8  HCT 42.2 39.6  MCV 90.6 92.5  PLT 209 216   BNP: Recent Labs  Lab 12/09/23 1736  BNP 686.2*   Thyroid  function studies Recent Labs    12/09/23 2209 12/10/23 0455  TSH 0.592 0.799  FREET4 1.38*  --    Sepsis Labs Recent Labs  Lab 12/09/23 1736 12/10/23 0455  PROCALCITON  --  <0.10  WBC 11.0* 11.2*    Procedures/Studies: ECHOCARDIOGRAM COMPLETE Result Date: 12/10/2023     ECHOCARDIOGRAM REPORT   Patient Name:   MAKAYIA DUPLESSIS Date of Exam: 12/10/2023 Medical Rec #:  [id number]     Height:       66.0 in Accession #:    [id number]    Weight:       192.0 lb Date of Birth:  September 17, 1945      BSA:          1.965 m Patient Age:    78 years      BP:           111/69 mmHg Patient Gender: F             HR:           81 bpm. Exam Location:  Inpatient Procedure: 2D Echo, Color Doppler, Cardiac Doppler and Intracardiac            Opacification Agent (Both Spectral and Color Flow Doppler were            utilized during procedure). Indications:    Atrial Fibrillation I48.91  History:        Patient has prior history of Echocardiogram examinations, most                 recent 09/04/2023.  Sonographer:    Tinnie Gosling RDCS Referring Phys: [id number] DORN DAWSON IMPRESSIONS  1. Left ventricular ejection fraction, by estimation, is 40 to 45%. The left ventricle has mildly decreased function. The left ventricle demonstrates regional wall motion abnormalities (see scoring diagram/findings for description). Left ventricular diastolic parameters are indeterminate. Abnormal  septal motion consistent with interventricular conduction delay.  2. Right ventricular systolic function is normal. The right ventricular size is not well visualized. There is mildly elevated pulmonary artery systolic pressure.  3. Left atrial size was mildly dilated.  4. The mitral valve is normal in structure. Mild mitral valve regurgitation. No evidence of mitral stenosis.  5. Tricuspid valve regurgitation is mild to moderate.  6. The aortic valve is tricuspid. Aortic valve regurgitation is not visualized. No aortic stenosis is present.  7. The inferior vena cava is normal in size with <50% respiratory variability, suggesting right atrial pressure of 8 mmHg. Comparison(s): Prior images reviewed side by side. Ventricular function has decreased from prior. This study is more technically difficult. FINDINGS  Left Ventricle: Left ventricular  ejection fraction, by estimation, is 40 to 45%. The left ventricle has mildly decreased function. The left ventricle demonstrates regional wall motion abnormalities. Definity  contrast agent was given IV to delineate the left ventricular endocardial borders. The left ventricular internal cavity size was normal in size. There is no left ventricular hypertrophy. Left ventricular diastolic parameters are indeterminate.  LV Wall Scoring: Abnormal septal motion consistent with interventricular conduction delay. Right Ventricle: The right ventricular size is not well visualized. No increase in right ventricular wall thickness. Right ventricular systolic function is normal. There is mildly elevated pulmonary artery systolic pressure. The tricuspid regurgitant velocity is 2.73 m/s, and with an assumed right atrial pressure of 8 mmHg, the estimated right ventricular systolic pressure is 37.8 mmHg. Left Atrium: Left atrial size was mildly dilated. Right Atrium: Right atrial size was normal in size. Pericardium: There is no evidence of pericardial effusion. Mitral Valve: The mitral valve is normal in structure. Mild mitral valve regurgitation. No evidence of mitral valve stenosis. Tricuspid Valve: The tricuspid valve is normal in structure. Tricuspid valve regurgitation is mild to moderate. No evidence of tricuspid stenosis. Aortic Valve: The aortic valve is tricuspid. Aortic valve regurgitation is not visualized. No aortic stenosis is present. Pulmonic Valve: The pulmonic valve was normal in structure. Pulmonic valve regurgitation is mild. No evidence of pulmonic stenosis. Aorta: The aortic root and ascending aorta are structurally normal, with no evidence of dilitation. Pulmonary Artery: The pulmonary artery is of normal size. Venous: The inferior vena cava is normal in size with less than 50% respiratory variability, suggesting right atrial pressure of 8 mmHg. IAS/Shunts: The atrial septum is grossly normal.  LEFT VENTRICLE  PLAX 2D LVIDd:         4.60 cm LVIDs:         3.40 cm LV PW:         1.00 cm LV IVS:        1.00 cm LVOT diam:     2.00 cm LV SV:         39 LV SV Index:   20 LVOT Area:     3.14 cm  RIGHT VENTRICLE RV S prime:     8.70 cm/s TAPSE (M-mode): 1.2 cm LEFT ATRIUM             Index        RIGHT ATRIUM           Index LA diam:        4.00 cm 2.04 cm/m   RA Area:     14.60 cm LA Vol (A2C):   79.3 ml 40.35 ml/m  RA Volume:   37.40 ml  19.03 ml/m LA Vol (A4C):   70.6 ml 35.92 ml/m LA  Biplane Vol: 75.9 ml 38.62 ml/m  AORTIC VALVE LVOT Vmax:   73.20 cm/s LVOT Vmean:  47.600 cm/s LVOT VTI:    0.125 m  AORTA Ao Root diam: 2.80 cm Ao Asc diam:  3.10 cm MITRAL VALVE                TRICUSPID VALVE MV Area (PHT): 4.26 cm     TR Peak grad:   29.8 mmHg MV Decel Time: 178 msec     TR Vmax:        273.00 cm/s MV E velocity: 120.00 cm/s MV A velocity: 71.00 cm/s   SHUNTS MV E/A ratio:  1.69         Systemic VTI:  0.12 m                             Systemic Diam: 2.00 cm Stanly Leavens MD Electronically signed by Stanly Leavens MD Signature Date/Time: 12/10/2023/9:48:51 AM    Final    DG Chest 2 View Result Date: 12/09/2023 CLINICAL DATA:  Shortness of breath. EXAM: CHEST - 2 VIEW COMPARISON:  December 02, 2023 FINDINGS: The heart size and mediastinal contours are within normal limits. Low lung volumes are noted. Mild, stable left basilar scarring, atelectasis and/or infiltrate is seen. Very mild right basilar atelectasis is also noted. There is a small right pleural effusion. No pneumothorax is identified. The visualized skeletal structures are unremarkable. IMPRESSION: 1. Low lung volumes with mild, bibasilar scarring, atelectasis and/or infiltrate, left greater than right. 2. Small right pleural effusion. Electronically Signed   By: Suzen Dials M.D.   On: 12/09/2023 18:58    Time coordinating discharge: 55 mins  SIGNED:  Camellia Door, DO [facility] 12/10/23, 4:35 PM

## 2023-12-10 NOTE — Assessment & Plan Note (Signed)
 12-10-2023 repleted with IV mag.

## 2023-12-10 NOTE — Progress Notes (Signed)
 PHARMACY - ANTICOAGULATION CONSULT NOTE  Pharmacy Consult for Heparin  Indication: atrial fibrillation  Allergies  Allergen Reactions   Sulfamethoxazole-Trimethoprim Hives and Rash   Venlafaxine Hives, Itching and Other (See Comments)    Headache   Imitrex [Sumatriptan]     hypertension   Morphine And Codeine Other (See Comments)    Hyper, confusion     Patient Measurements: Height: 5' 6 (167.6 cm) Weight: 87.1 kg (192 lb) IBW/kg (Calculated) : 59.3 HEPARIN  DW (KG): 78  Vital Signs: Temp: 99.4 F (37.4 C) (07/22 0427) Temp Source: Oral (07/22 0427) BP: 105/54 (07/22 0700) Pulse Rate: 75 (07/22 0700)  Labs: Recent Labs    12/09/23 1736 12/10/23 0455  HGB 14.0 12.8  HCT 42.2 39.6  PLT 209 216  HEPARINUNFRC  --  0.53  CREATININE 0.85 0.98    Estimated Creatinine Clearance: 53.4 mL/min (by C-G formula based on SCr of 0.98 mg/dL).   Medical History: Past Medical History:  Diagnosis Date   Anxiety    Breast cancer (HCC)    Hypertension    PMR (polymyalgia rheumatica) (HCC)    Pneumonia    Assessment: 78 y.o. F presents with SOB and tachycardia and found to be in afib. No anticoagulation prior to presentation. Pharmacy consulted to start heparin .  Heparin  level 0.53, therapeutic  No overt s/sx bleeding or issues with infusion noted.   Goal of Therapy:  Heparin  level 0.3-0.7 units/ml Monitor platelets by anticoagulation protocol: Yes   Plan:  Continue heparin  infusion at 1200 units/hr Daily heparin  level, CBC, and monitoring for bleeding F/u plans for anticoagulation   Thank you for allowing pharmacy to participate in this patient's care.  Leonor GORMAN Bash, PharmD Emergency Medicine Clinical Pharmacist 12/10/2023,7:33 AM

## 2023-12-10 NOTE — ED Notes (Signed)
 PT Cardizem  stopped at 0104, 40 min after PO Metropolol given.PT current HR 80 bpm

## 2023-12-10 NOTE — Assessment & Plan Note (Signed)
 12-10-2023 repleted with po kcl.

## 2023-12-10 NOTE — ED Notes (Signed)
 PT BP still running soft. Physician notified

## 2023-12-10 NOTE — Assessment & Plan Note (Addendum)
 12-10-2023 LVEF 40-45%. Stable. Pt was given one dose of IV lasix . On RA. No SOB or DOE. Does not need diuretics now that she is back in NSR. Home with lopressor  12.5 mg bid.

## 2023-12-10 NOTE — Care Management CC44 (Signed)
 Condition Code 44 Documentation Completed  Patient Details  Name: Melissa Sosa MRN: [id number] Date of Birth: 05/06/1946   Condition Code 44 given:  Yes Patient signature on Condition Code 44 notice:  Yes Documentation of 2 MD's agreement:  Yes Code 44 added to claim:  Yes    Nitzia Perren, RN 12/10/2023, 4:54 PM

## 2023-12-10 NOTE — Telephone Encounter (Signed)
 Patient Product/process development scientist completed.    The patient is insured through [facility]. Patient has Medicare and is not eligible for a copay card, but may be able to apply for patient assistance or Medicare RX Payment Plan (Patient Must reach out to their plan, if eligible for payment plan), if available.    Ran test claim for Eliquis  5 mg and the current 30 day co-pay is $342.68 due to a deductible.  Will be $45.00 once deductible is met.  Ran test claim for Xarelto 20 mg and the current 30 day co-pay is $342.68 due to a deductible.  Will be $45.00 once deductible is met.  This test claim was processed through [facility]- copay amounts may vary at other pharmacies due to pharmacy/plan contracts, or as the patient moves through the different stages of their insurance plan.     Reyes Sharps, CPHT Pharmacy Technician III Certified Patient Advocate [facility] Pharmacy Patient Advocate Team Direct Number: [phone]  Fax: [phone]

## 2023-12-10 NOTE — Assessment & Plan Note (Signed)
 12-10-2023 continue with keppra . Stable.0

## 2023-12-10 NOTE — ED Notes (Signed)
Nebulizer treatment complete.

## 2023-12-10 NOTE — Hospital Course (Addendum)
 HPI: Melissa Sosa is a 78 y.o. 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 Reports onset of symptoms around 2 weeks ago. At time had productive cough, myalgias, DOE, orthopnea. Was seen outpatient and ultimately Rx'd 7 day course of Augmentin and Doxycycline for CAP which she completed yesterday. Despite completing abx still with persistent DOE / orthopnea. However, her cough has improved. She does have mild swelling in both legs. No chest pain. No known hx of Afib or treatment for CHF. No issues with bleeding, no falls.   Significant Events: Admitted 12/09/2023 for afib with RVR. Started on cardizem  gtts, IV heparin    Admission Labs: WBC 11, HgB 14, Pt 209 Na 135, K 4.6, CO2 of 21, BUN 15, scr 0.85, glu 133 Covid/flu/rsv negative BNP 686 TSH 0.592, FT4 1.38 Lactic acid 2.1 RVP negative  Admission Imaging Studies:   Significant Labs: Procalcitonin < 0.10  Significant Imaging Studies: CXR Low lung volumes with mild, bibasilar scarring, atelectasis and/or infiltrate, left greater than right. 2. Small right pleural effusion. Echo shows LVEF 40-45%  Antibiotic Therapy: Anti-infectives (From admission, onward)    Start     Dose/Rate Route Frequency Ordered Stop   12/09/23 2100  cefTRIAXone  (ROCEPHIN ) 1 g in sodium chloride  0.9 % 100 mL IVPB        1 g 200 mL/hr over 30 Minutes Intravenous  Once 12/09/23 2051 12/09/23 2134   12/09/23 2100  azithromycin  (ZITHROMAX ) 500 mg in sodium chloride  0.9 % 250 mL IVPB        500 mg 250 mL/hr over 60 Minutes Intravenous  Once 12/09/23 2051 12/09/23 2311       Procedures:   Consultants:

## 2023-12-10 NOTE — Progress Notes (Addendum)
 PROGRESS NOTE    Melissa Sosa  FMW:[id number] DOB: [birth year] DOA: 12/09/2023 PCP: Cristopher Bottcher, NP  Subjective: Pt seen and examined. Met with pt, her son and dtr-in-law at bedside. Has converted back to NSR. Echo shows LVEF 40-45%. Pt feels better. No SOB or DOE when walking to bathroom.    Hospital Course: HPI: Melissa Sosa is a 78 y.o. 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 Reports onset of symptoms around 2 weeks ago. At time had productive cough, myalgias, DOE, orthopnea. Was seen outpatient and ultimately Rx'd 7 day course of Augmentin and Doxycycline for CAP which she completed yesterday. Despite completing abx still with persistent DOE / orthopnea. However, her cough has improved. She does have mild swelling in both legs. No chest pain. No known hx of Afib or treatment for CHF. No issues with bleeding, no falls.   Significant Events: Admitted 12/09/2023 for afib with RVR. Started on cardizem  gtts, IV heparin    Admission Labs: WBC 11, HgB 14, Pt 209 Na 135, K 4.6, CO2 of 21, BUN 15, scr 0.85, glu 133 Covid/flu/rsv negative BNP 686 TSH 0.592, FT4 1.38 Lactic acid 2.1 RVP negative  Admission Imaging Studies:   Significant Labs: Procalcitonin < 0.10  Significant Imaging Studies: CXR Low lung volumes with mild, bibasilar scarring, atelectasis and/or infiltrate, left greater than right. 2. Small right pleural effusion. Echo shows LVEF 40-45%  Antibiotic Therapy: Anti-infectives (From admission, onward)    Start     Dose/Rate Route Frequency Ordered Stop   12/09/23 2100  cefTRIAXone  (ROCEPHIN ) 1 g in sodium chloride  0.9 % 100 mL IVPB        1 g 200 mL/hr over 30 Minutes Intravenous  Once 12/09/23 2051 12/09/23 2134   12/09/23 2100  azithromycin  (ZITHROMAX )  500 mg in sodium chloride  0.9 % 250 mL IVPB        500 mg 250 mL/hr over 60 Minutes Intravenous  Once 12/09/23 2051 12/09/23 2311       Procedures:   Consultants:     Assessment and Plan: * Atrial fibrillation with RVR (HCC) 12-10-2023 converted to NSR. Will change to Eliquis . Obtain EKG to document resolution of afib and return to NSR. LVEF slightly down. EF 45%. Likely due to unknown afib. She can can repeat echo in 6-12 months to see if LVEF has recovered. Place on low dose lopressor  12.5 mg bid. Change to regular diet. Pt states she is not on BP meds anymore due to low BP at home. TSH is normal. CHAD-VASC score is 3. Pt agreeable to taking Eliquis .  Discussed with pt's son and dtr-in-law.  Reactive airway disease 12-10-2023 stable. On RA. No wheezing.  (HFpEF) heart failure with preserved ejection fraction (HCC) 12-10-2023 LVEF 40-45%. Stable. Pt was given one dose of IV lasix . On RA. No SOB or DOE. Does not need diuretics now that she is back in NSR. Home with lopressor  12.5 mg bid.  Seizures (HCC) 12-10-2023 continue with keppra . Stable.0  Hypomagnesemia 12-10-2023 repleted with IV mag.  Hypokalemia 12-10-2023 repleted with po kcl.  Obesity, Class I, BMI 30-34.9 Body mass index is 32.31 kg/m.    DVT prophylaxis:  apixaban  (ELIQUIS ) tablet 5 mg     Code Status: Full Code Family Communication: discussed with pt, son, dtr-in-law at bedside Disposition Plan: return home Reason for continuing  need for hospitalization: stable for DC to home.  Objective: Vitals:   12/10/23 0723 12/10/23 0845 12/10/23 0946 12/10/23 1151  BP:  111/69 122/65 (!) 109/94  Pulse:  80 88 76  Resp:  (!) 25 (!) 21   Temp:   98 F (36.7 C) 98.1 F (36.7 C)  TempSrc:   Oral Oral  SpO2: 97% 100% 96% 97%  Weight:   90.8 kg   Height:   5' 6 (1.676 m)     Intake/Output Summary (Last 24 hours) at 12/10/2023 1635 Last data filed at 12/10/2023 0048 Gross per 24 hour  Intake 359.14 ml   Output --  Net 359.14 ml   Filed Weights   12/09/23 1738 12/10/23 0946  Weight: 87.1 kg 90.8 kg    Examination:  Physical Exam Vitals and nursing note reviewed.  Constitutional:      General: She is not in acute distress.    Appearance: Normal appearance. She is not toxic-appearing or diaphoretic.  HENT:     Head: Normocephalic and atraumatic.     Nose: Nose normal.  Eyes:     General: No scleral icterus. Cardiovascular:     Rate and Rhythm: Normal rate and regular rhythm.     Heart sounds: No murmur heard. Pulmonary:     Effort: Pulmonary effort is normal.     Breath sounds: Normal breath sounds.  Abdominal:     General: Bowel sounds are normal. There is no distension.     Palpations: Abdomen is soft. There is no mass.     Tenderness: There is no abdominal tenderness. There is no guarding or rebound.  Musculoskeletal:     Right lower leg: No edema.     Left lower leg: No edema.  Skin:    General: Skin is warm and dry.     Capillary Refill: Capillary refill takes less than 2 seconds.  Neurological:     General: No focal deficit present.     Mental Status: She is alert and oriented to person, place, and time.    Data Reviewed: I have personally reviewed following labs and imaging studies  CBC: Recent Labs  Lab 12/09/23 1736 12/10/23 0455  WBC 11.0* 11.2*  NEUTROABS 8.6*  --   HGB 14.0 12.8  HCT 42.2 39.6  MCV 90.6 92.5  PLT 209 216   Basic Metabolic Panel: Recent Labs  Lab 12/09/23 1736 12/10/23 0455  NA 135 133*  K 4.6 3.4*  CL 103 100  CO2 21* 22  GLUCOSE 133* 145*  BUN 15 14  CREATININE 0.85 0.98  CALCIUM 9.3 9.2  MG  --  1.6*  PHOS  --  2.8   GFR: Estimated Creatinine Clearance: 54.6 mL/min (by C-G formula based on SCr of 0.98 mg/dL). BNP (last 3 results) Recent Labs    12/09/23 1736  BNP 686.2*   Thyroid  Function Tests: Recent Labs    12/09/23 2209 12/10/23 0455  TSH 0.592 0.799  FREET4 1.38*  --    Sepsis Labs: Recent Labs   Lab 12/10/23 0343 12/10/23 0455  PROCALCITON  --  <0.10  LATICACIDVEN 2.1* 1.7    Recent Results (from the past 240 hours)  Resp panel by RT-PCR (RSV, Flu A&B, Covid) Anterior Nasal Swab     Status: None   Collection Time: 12/09/23  5:36 PM   Specimen: Anterior Nasal Swab  Result Value Ref Range Status   SARS Coronavirus 2 by RT PCR NEGATIVE NEGATIVE Final   Influenza A by  PCR NEGATIVE NEGATIVE Final   Influenza B by PCR NEGATIVE NEGATIVE Final    Comment: (NOTE) The Xpert Xpress SARS-CoV-2/FLU/RSV plus assay is intended as an aid in the diagnosis of influenza from Nasopharyngeal swab specimens and should not be used as a sole basis for treatment. Nasal washings and aspirates are unacceptable for Xpert Xpress SARS-CoV-2/FLU/RSV testing.  Fact Sheet for Patients: BloggerCourse.com  Fact Sheet for Healthcare Providers: SeriousBroker.it  This test is not yet approved or cleared by the [place]  FDA and has been authorized for detection and/or diagnosis of SARS-CoV-2 by FDA under an Emergency Use Authorization (EUA). This EUA will remain in effect (meaning this test can be used) for the duration of the COVID-19 declaration under Section 564(b)(1) of the Act, 21 U.S.C. section 360bbb-3(b)(1), unless the authorization is terminated or revoked.     Resp Syncytial Virus by PCR NEGATIVE NEGATIVE Final    Comment: (NOTE) Fact Sheet for Patients: BloggerCourse.com  Fact Sheet for Healthcare Providers: SeriousBroker.it  This test is not yet approved or cleared by the [place]  FDA and has been authorized for detection and/or diagnosis of SARS-CoV-2 by FDA under an Emergency Use Authorization (EUA). This EUA will remain in effect (meaning this test can be used) for the duration of the COVID-19 declaration under Section 564(b)(1) of the Act, 21 U.S.C. section 360bbb-3(b)(1),  unless the authorization is terminated or revoked.  Performed at [facility], [address], [place], [place] [address]   Respiratory (~20 pathogens) panel by PCR     Status: None   Collection Time: 12/10/23  3:43 AM   Specimen: Nasopharyngeal Swab; Respiratory  Result Value Ref Range Status   Adenovirus NOT DETECTED NOT DETECTED Final   Coronavirus 229E NOT DETECTED NOT DETECTED Final    Comment: (NOTE) The Coronavirus on the Respiratory Panel, DOES NOT test for the novel  Coronavirus (2019 nCoV)    Coronavirus HKU1 NOT DETECTED NOT DETECTED Final   Coronavirus NL63 NOT DETECTED NOT DETECTED Final   Coronavirus OC43 NOT DETECTED NOT DETECTED Final   Metapneumovirus NOT DETECTED NOT DETECTED Final   Rhinovirus / Enterovirus NOT DETECTED NOT DETECTED Final   Influenza A NOT DETECTED NOT DETECTED Final   Influenza B NOT DETECTED NOT DETECTED Final   Parainfluenza Virus 1 NOT DETECTED NOT DETECTED Final   Parainfluenza Virus 2 NOT DETECTED NOT DETECTED Final   Parainfluenza Virus 3 NOT DETECTED NOT DETECTED Final   Parainfluenza Virus 4 NOT DETECTED NOT DETECTED Final   Respiratory Syncytial Virus NOT DETECTED NOT DETECTED Final   Bordetella pertussis NOT DETECTED NOT DETECTED Final   Bordetella Parapertussis NOT DETECTED NOT DETECTED Final   Chlamydophila pneumoniae NOT DETECTED NOT DETECTED Final   Mycoplasma pneumoniae NOT DETECTED NOT DETECTED Final    Comment: Performed at [facility], [address], [place], [place] [address]     Radiology Studies: ECHOCARDIOGRAM COMPLETE Result Date: 12/10/2023    ECHOCARDIOGRAM REPORT   Patient Name:   MARITZA HOSTERMAN Date of Exam: 12/10/2023 Medical Rec #:  [id number]     Height:       66.0 in Accession #:    [id number]    Weight:       192.0 lb Date of Birth:  22-Apr-1946      BSA:          1.965 m Patient Age:    78 years      BP:  111/69 mmHg Patient Gender: F             HR:           81 bpm. Exam Location:  Inpatient  Procedure: 2D Echo, Color Doppler, Cardiac Doppler and Intracardiac            Opacification Agent (Both Spectral and Color Flow Doppler were            utilized during procedure). Indications:    Atrial Fibrillation I48.91  History:        Patient has prior history of Echocardiogram examinations, most                 recent 09/04/2023.  Sonographer:    Tinnie Gosling RDCS Referring Phys: [id number] DORN DAWSON IMPRESSIONS  1. Left ventricular ejection fraction, by estimation, is 40 to 45%. The left ventricle has mildly decreased function. The left ventricle demonstrates regional wall motion abnormalities (see scoring diagram/findings for description). Left ventricular diastolic parameters are indeterminate. Abnormal septal motion consistent with interventricular conduction delay.  2. Right ventricular systolic function is normal. The right ventricular size is not well visualized. There is mildly elevated pulmonary artery systolic pressure.  3. Left atrial size was mildly dilated.  4. The mitral valve is normal in structure. Mild mitral valve regurgitation. No evidence of mitral stenosis.  5. Tricuspid valve regurgitation is mild to moderate.  6. The aortic valve is tricuspid. Aortic valve regurgitation is not visualized. No aortic stenosis is present.  7. The inferior vena cava is normal in size with <50% respiratory variability, suggesting right atrial pressure of 8 mmHg. Comparison(s): Prior images reviewed side by side. Ventricular function has decreased from prior. This study is more technically difficult. FINDINGS  Left Ventricle: Left ventricular ejection fraction, by estimation, is 40 to 45%. The left ventricle has mildly decreased function. The left ventricle demonstrates regional wall motion abnormalities. Definity  contrast agent was given IV to delineate the left ventricular endocardial borders. The left ventricular internal cavity size was normal in size. There is no left ventricular hypertrophy. Left  ventricular diastolic parameters are indeterminate.  LV Wall Scoring: Abnormal septal motion consistent with interventricular conduction delay. Right Ventricle: The right ventricular size is not well visualized. No increase in right ventricular wall thickness. Right ventricular systolic function is normal. There is mildly elevated pulmonary artery systolic pressure. The tricuspid regurgitant velocity is 2.73 m/s, and with an assumed right atrial pressure of 8 mmHg, the estimated right ventricular systolic pressure is 37.8 mmHg. Left Atrium: Left atrial size was mildly dilated. Right Atrium: Right atrial size was normal in size. Pericardium: There is no evidence of pericardial effusion. Mitral Valve: The mitral valve is normal in structure. Mild mitral valve regurgitation. No evidence of mitral valve stenosis. Tricuspid Valve: The tricuspid valve is normal in structure. Tricuspid valve regurgitation is mild to moderate. No evidence of tricuspid stenosis. Aortic Valve: The aortic valve is tricuspid. Aortic valve regurgitation is not visualized. No aortic stenosis is present. Pulmonic Valve: The pulmonic valve was normal in structure. Pulmonic valve regurgitation is mild. No evidence of pulmonic stenosis. Aorta: The aortic root and ascending aorta are structurally normal, with no evidence of dilitation. Pulmonary Artery: The pulmonary artery is of normal size. Venous: The inferior vena cava is normal in size with less than 50% respiratory variability, suggesting right atrial pressure of 8 mmHg. IAS/Shunts: The atrial septum is grossly normal.  LEFT VENTRICLE PLAX 2D LVIDd:  4.60 cm LVIDs:         3.40 cm LV PW:         1.00 cm LV IVS:        1.00 cm LVOT diam:     2.00 cm LV SV:         39 LV SV Index:   20 LVOT Area:     3.14 cm  RIGHT VENTRICLE RV S prime:     8.70 cm/s TAPSE (M-mode): 1.2 cm LEFT ATRIUM             Index        RIGHT ATRIUM           Index LA diam:        4.00 cm 2.04 cm/m   RA Area:      14.60 cm LA Vol (A2C):   79.3 ml 40.35 ml/m  RA Volume:   37.40 ml  19.03 ml/m LA Vol (A4C):   70.6 ml 35.92 ml/m LA Biplane Vol: 75.9 ml 38.62 ml/m  AORTIC VALVE LVOT Vmax:   73.20 cm/s LVOT Vmean:  47.600 cm/s LVOT VTI:    0.125 m  AORTA Ao Root diam: 2.80 cm Ao Asc diam:  3.10 cm MITRAL VALVE                TRICUSPID VALVE MV Area (PHT): 4.26 cm     TR Peak grad:   29.8 mmHg MV Decel Time: 178 msec     TR Vmax:        273.00 cm/s MV E velocity: 120.00 cm/s MV A velocity: 71.00 cm/s   SHUNTS MV E/A ratio:  1.69         Systemic VTI:  0.12 m                             Systemic Diam: 2.00 cm Stanly Leavens MD Electronically signed by Stanly Leavens MD Signature Date/Time: 12/10/2023/9:48:51 AM    Final    DG Chest 2 View Result Date: 12/09/2023 CLINICAL DATA:  Shortness of breath. EXAM: CHEST - 2 VIEW COMPARISON:  December 02, 2023 FINDINGS: The heart size and mediastinal contours are within normal limits. Low lung volumes are noted. Mild, stable left basilar scarring, atelectasis and/or infiltrate is seen. Very mild right basilar atelectasis is also noted. There is a small right pleural effusion. No pneumothorax is identified. The visualized skeletal structures are unremarkable. IMPRESSION: 1. Low lung volumes with mild, bibasilar scarring, atelectasis and/or infiltrate, left greater than right. 2. Small right pleural effusion. Electronically Signed   By: Suzen Dials M.D.   On: 12/09/2023 18:58    Scheduled Meds:  apixaban   5 mg Oral STAT   levETIRAcetam   750 mg Oral BID   metoprolol  tartrate  12.5 mg Oral BID   nortriptyline   25 mg Oral QHS   sodium chloride  flush  3 mL Intravenous Q12H   Continuous Infusions:   LOS: 1 day   Time spent: 50 minutes  Camellia Door, DO  [facility]  12/10/2023, 4:35 PM

## 2023-12-10 NOTE — Care Management Obs Status (Signed)
 MEDICARE OBSERVATION STATUS NOTIFICATION   Patient Details  Name: Melissa Sosa MRN: [id number] Date of Birth: [birth year]   Medicare Observation Status Notification Given:  Yes    Iveliz Garay, RN 12/10/2023, 4:53 PM

## 2023-12-10 NOTE — Progress Notes (Signed)
 PT Cancellation Note  Patient Details Name: Persephone Schriever MRN: [id number] DOB: 06/25/1945   Cancelled Treatment:    Reason Eval/Treat Not Completed: Other (comment) (Patient unavailable this morning and now sleeping soundly after a long night. Spoke with nurse who reports patient has been up and walking in the room. PT will follow up another time.)  Randine Essex, PT, MPT  Randine LULLA Essex 12/10/2023, 2:01 PM

## 2023-12-10 NOTE — Progress Notes (Signed)
  Echocardiogram 2D Echocardiogram has been performed.  Tinnie FORBES Gosling RDCS 12/10/2023, 9:16 AM

## 2023-12-10 NOTE — Assessment & Plan Note (Signed)
 12-10-2023 converted to NSR. Will change to Eliquis . Obtain EKG to document resolution of afib and return to NSR. LVEF slightly down. EF 45%. Likely due to unknown afib. She can can repeat echo in 6-12 months to see if LVEF has recovered. Place on low dose lopressor  12.5 mg bid. Change to regular diet. Pt states she is not on BP meds anymore due to low BP at home. TSH is normal. CHAD-VASC score is 3. Pt agreeable to taking Eliquis .  Discussed with pt's son and dtr-in-law.

## 2023-12-10 NOTE — ED Notes (Signed)
 Called and placed PT on monitor with CCMD.

## 2023-12-10 NOTE — Assessment & Plan Note (Signed)
 12-10-2023 stable. On RA. No wheezing.

## 2023-12-12 DIAGNOSIS — R569 Unspecified convulsions: Secondary | ICD-10-CM | POA: Diagnosis not present

## 2023-12-12 DIAGNOSIS — I5020 Unspecified systolic (congestive) heart failure: Secondary | ICD-10-CM | POA: Diagnosis not present

## 2023-12-12 DIAGNOSIS — J189 Pneumonia, unspecified organism: Secondary | ICD-10-CM | POA: Diagnosis not present

## 2023-12-12 DIAGNOSIS — I4891 Unspecified atrial fibrillation: Secondary | ICD-10-CM | POA: Diagnosis not present

## 2023-12-12 DIAGNOSIS — D72829 Elevated white blood cell count, unspecified: Secondary | ICD-10-CM | POA: Diagnosis not present

## 2023-12-16 DIAGNOSIS — I4891 Unspecified atrial fibrillation: Secondary | ICD-10-CM | POA: Diagnosis not present

## 2023-12-16 DIAGNOSIS — J189 Pneumonia, unspecified organism: Secondary | ICD-10-CM | POA: Diagnosis not present

## 2023-12-23 ENCOUNTER — Other Ambulatory Visit (HOSPITAL_COMMUNITY): Payer: Self-pay

## 2023-12-24 ENCOUNTER — Other Ambulatory Visit: Payer: Self-pay

## 2023-12-24 ENCOUNTER — Inpatient Hospital Stay (HOSPITAL_COMMUNITY)
Admission: EM | Admit: 2023-12-24 | Discharge: 2023-12-26 | DRG: 291 | Disposition: A | Discharge status: Home / Self Care | Attending: Student | Admitting: Student

## 2023-12-24 ENCOUNTER — Emergency Department (HOSPITAL_COMMUNITY)

## 2023-12-24 ENCOUNTER — Encounter (HOSPITAL_COMMUNITY): Payer: Self-pay

## 2023-12-24 DIAGNOSIS — I447 Left bundle-branch block, unspecified: Secondary | ICD-10-CM | POA: Diagnosis present

## 2023-12-24 DIAGNOSIS — I110 Hypertensive heart disease with heart failure: Principal | ICD-10-CM | POA: Diagnosis present

## 2023-12-24 DIAGNOSIS — Z8541 Personal history of malignant neoplasm of cervix uteri: Secondary | ICD-10-CM | POA: Diagnosis not present

## 2023-12-24 DIAGNOSIS — E8770 Fluid overload, unspecified: Secondary | ICD-10-CM

## 2023-12-24 DIAGNOSIS — Z7901 Long term (current) use of anticoagulants: Secondary | ICD-10-CM | POA: Diagnosis not present

## 2023-12-24 DIAGNOSIS — Z888 Allergy status to other drugs, medicaments and biological substances status: Secondary | ICD-10-CM | POA: Diagnosis not present

## 2023-12-24 DIAGNOSIS — E66811 Obesity, class 1: Secondary | ICD-10-CM | POA: Diagnosis present

## 2023-12-24 DIAGNOSIS — Z79899 Other long term (current) drug therapy: Secondary | ICD-10-CM

## 2023-12-24 DIAGNOSIS — Z8542 Personal history of malignant neoplasm of other parts of uterus: Secondary | ICD-10-CM

## 2023-12-24 DIAGNOSIS — R0601 Orthopnea: Secondary | ICD-10-CM | POA: Diagnosis not present

## 2023-12-24 DIAGNOSIS — R569 Unspecified convulsions: Secondary | ICD-10-CM | POA: Diagnosis present

## 2023-12-24 DIAGNOSIS — N179 Acute kidney failure, unspecified: Secondary | ICD-10-CM | POA: Diagnosis present

## 2023-12-24 DIAGNOSIS — Z853 Personal history of malignant neoplasm of breast: Secondary | ICD-10-CM

## 2023-12-24 DIAGNOSIS — R0789 Other chest pain: Secondary | ICD-10-CM | POA: Diagnosis not present

## 2023-12-24 DIAGNOSIS — J90 Pleural effusion, not elsewhere classified: Secondary | ICD-10-CM | POA: Diagnosis not present

## 2023-12-24 DIAGNOSIS — Z882 Allergy status to sulfonamides status: Secondary | ICD-10-CM | POA: Diagnosis not present

## 2023-12-24 DIAGNOSIS — Z923 Personal history of irradiation: Secondary | ICD-10-CM | POA: Diagnosis not present

## 2023-12-24 DIAGNOSIS — R931 Abnormal findings on diagnostic imaging of heart and coronary circulation: Secondary | ICD-10-CM | POA: Diagnosis not present

## 2023-12-24 DIAGNOSIS — Z9221 Personal history of antineoplastic chemotherapy: Secondary | ICD-10-CM

## 2023-12-24 DIAGNOSIS — M353 Polymyalgia rheumatica: Secondary | ICD-10-CM | POA: Diagnosis present

## 2023-12-24 DIAGNOSIS — Z6831 Body mass index (BMI) 31.0-31.9, adult: Secondary | ICD-10-CM | POA: Diagnosis not present

## 2023-12-24 DIAGNOSIS — I509 Heart failure, unspecified: Secondary | ICD-10-CM | POA: Diagnosis not present

## 2023-12-24 DIAGNOSIS — Z8572 Personal history of non-Hodgkin lymphomas: Secondary | ICD-10-CM | POA: Diagnosis not present

## 2023-12-24 DIAGNOSIS — R0609 Other forms of dyspnea: Secondary | ICD-10-CM | POA: Diagnosis not present

## 2023-12-24 DIAGNOSIS — F32A Depression, unspecified: Secondary | ICD-10-CM | POA: Diagnosis present

## 2023-12-24 DIAGNOSIS — J9811 Atelectasis: Secondary | ICD-10-CM | POA: Diagnosis present

## 2023-12-24 DIAGNOSIS — I5082 Biventricular heart failure: Secondary | ICD-10-CM | POA: Diagnosis present

## 2023-12-24 DIAGNOSIS — F419 Anxiety disorder, unspecified: Secondary | ICD-10-CM | POA: Diagnosis present

## 2023-12-24 DIAGNOSIS — I50813 Acute on chronic right heart failure: Secondary | ICD-10-CM

## 2023-12-24 DIAGNOSIS — I480 Paroxysmal atrial fibrillation: Secondary | ICD-10-CM | POA: Diagnosis present

## 2023-12-24 DIAGNOSIS — Z881 Allergy status to other antibiotic agents status: Secondary | ICD-10-CM | POA: Diagnosis not present

## 2023-12-24 DIAGNOSIS — I5023 Acute on chronic systolic (congestive) heart failure: Secondary | ICD-10-CM | POA: Diagnosis present

## 2023-12-24 DIAGNOSIS — R079 Chest pain, unspecified: Secondary | ICD-10-CM | POA: Diagnosis not present

## 2023-12-24 DIAGNOSIS — I4891 Unspecified atrial fibrillation: Secondary | ICD-10-CM | POA: Diagnosis present

## 2023-12-24 DIAGNOSIS — Z885 Allergy status to narcotic agent status: Secondary | ICD-10-CM | POA: Diagnosis not present

## 2023-12-24 DIAGNOSIS — Z90710 Acquired absence of both cervix and uterus: Secondary | ICD-10-CM

## 2023-12-24 LAB — COMPREHENSIVE METABOLIC PANEL WITH GFR
ALT: 21 U/L (ref 0–44)
AST: 41 U/L (ref 15–41)
Albumin: 3.5 g/dL (ref 3.5–5.0)
Alkaline Phosphatase: 84 U/L (ref 38–126)
Anion gap: 12 (ref 5–15)
BUN: 12 mg/dL (ref 8–23)
CO2: 23 mmol/L (ref 22–32)
Calcium: 9.4 mg/dL (ref 8.9–10.3)
Chloride: 106 mmol/L (ref 98–111)
Creatinine, Ser: 0.92 mg/dL (ref 0.44–1.00)
GFR, Estimated: >60 mL/min (ref >60)
Glucose, Bld: 105 mg/dL — ABNORMAL HIGH (ref 70–99)
Potassium: 3.9 mmol/L (ref 3.5–5.1)
Sodium: 141 mmol/L (ref 135–145)
Total Bilirubin: 1 mg/dL (ref 0.0–1.2)
Total Protein: 7.3 g/dL (ref 6.5–8.1)

## 2023-12-24 LAB — CBC
HCT: 41.5 % (ref 36.0–46.0)
Hemoglobin: 13.3 g/dL (ref 12.0–15.0)
MCH: 29.2 pg (ref 26.0–34.0)
MCHC: 32 g/dL (ref 30.0–36.0)
MCV: 91 fL (ref 80.0–100.0)
Platelets: 318 K/uL (ref 150–400)
RBC: 4.56 MIL/uL (ref 3.87–5.11)
RDW: 13.5 % (ref 11.5–15.5)
WBC: 7.5 K/uL (ref 4.0–10.5)
nRBC: 0 % (ref 0.0–0.2)

## 2023-12-24 LAB — BRAIN NATRIURETIC PEPTIDE: B Natriuretic Peptide: 1036.5 pg/mL — ABNORMAL HIGH (ref 0.0–100.0)

## 2023-12-24 LAB — TROPONIN I (HIGH SENSITIVITY)
Troponin I (High Sensitivity): 17 ng/L (ref <18)
Troponin I (High Sensitivity): 14 ng/L (ref <18)

## 2023-12-24 LAB — MAGNESIUM: Magnesium: 2.1 mg/dL (ref 1.7–2.4)

## 2023-12-24 MED ORDER — ACETAMINOPHEN 325 MG PO TABS
650.0000 mg | ORAL_TABLET | ORAL | Status: DC | PRN
  Administered 2023-12-26: 650 mg via ORAL
  Filled 2023-12-24 (×2): qty 2

## 2023-12-24 MED ORDER — SODIUM CHLORIDE 0.9% FLUSH: 3.0000 mL | INTRAVENOUS | Status: DC | PRN

## 2023-12-24 MED ORDER — DILTIAZEM HCL-DEXTROSE 125-5 MG/125ML-% IV SOLN (PREMIX)
5.0000 mg/h | INTRAVENOUS | Status: DC
  Administered 2023-12-24: 10 mg/h via INTRAVENOUS
  Administered 2023-12-24: 5 mg/h via INTRAVENOUS
  Filled 2023-12-24: qty 125

## 2023-12-24 MED ORDER — FUROSEMIDE 10 MG/ML IJ SOLN
60.0000 mg | Freq: Once | INTRAMUSCULAR | Status: AC
  Administered 2023-12-24: 60 mg via INTRAVENOUS
  Filled 2023-12-24: qty 6

## 2023-12-24 MED ORDER — SODIUM CHLORIDE 0.9% FLUSH
3.0000 mL | Freq: Two times a day (BID) | INTRAVENOUS | Status: DC
  Administered 2023-12-24 – 2023-12-26 (×4): 3 mL via INTRAVENOUS

## 2023-12-24 MED ORDER — ADULT MULTIVITAMIN W/MINERALS CH
1.0000 | ORAL_TABLET | Freq: Every day | ORAL | Status: DC
  Filled 2023-12-24: qty 1

## 2023-12-24 MED ORDER — NORTRIPTYLINE HCL 25 MG PO CAPS
25.0000 mg | ORAL_CAPSULE | Freq: Every day | ORAL | Status: DC
  Administered 2023-12-24 – 2023-12-25 (×2): 25 mg via ORAL
  Filled 2023-12-24 (×4): qty 1

## 2023-12-24 MED ORDER — APIXABAN 5 MG PO TABS
5.0000 mg | ORAL_TABLET | Freq: Two times a day (BID) | ORAL | Status: DC
  Administered 2023-12-24 – 2023-12-26 (×4): 5 mg via ORAL
  Filled 2023-12-24 (×4): qty 1

## 2023-12-24 MED ORDER — SODIUM CHLORIDE 0.9 % IV SOLN: 250.0000 mL | INTRAVENOUS | Status: AC | PRN

## 2023-12-24 MED ORDER — LOSARTAN POTASSIUM 50 MG PO TABS
25.0000 mg | ORAL_TABLET | Freq: Every day | ORAL | Status: DC
  Administered 2023-12-25: 25 mg via ORAL
  Filled 2023-12-24: qty 1

## 2023-12-24 MED ORDER — METOPROLOL TARTRATE 50 MG PO TABS: 50.0000 mg | ORAL_TABLET | Freq: Two times a day (BID) | ORAL | Status: DC

## 2023-12-24 MED ORDER — POTASSIUM CHLORIDE CRYS ER 20 MEQ PO TBCR
40.0000 meq | EXTENDED_RELEASE_TABLET | Freq: Once | ORAL | Status: AC
  Administered 2023-12-24: 40 meq via ORAL
  Filled 2023-12-24: qty 2

## 2023-12-24 MED ORDER — FUROSEMIDE 10 MG/ML IJ SOLN
40.0000 mg | Freq: Two times a day (BID) | INTRAMUSCULAR | Status: DC
  Administered 2023-12-25 (×2): 40 mg via INTRAVENOUS
  Filled 2023-12-24 (×2): qty 4

## 2023-12-24 MED ORDER — ONDANSETRON HCL 4 MG/2ML IJ SOLN: 4.0000 mg | Freq: Four times a day (QID) | INTRAMUSCULAR | Status: DC | PRN

## 2023-12-24 MED ORDER — METOPROLOL TARTRATE 50 MG PO TABS
50.0000 mg | ORAL_TABLET | Freq: Two times a day (BID) | ORAL | Status: DC
  Administered 2023-12-24 – 2023-12-26 (×4): 50 mg via ORAL
  Filled 2023-12-24 (×4): qty 1

## 2023-12-24 MED ORDER — DILTIAZEM LOAD VIA INFUSION
10.0000 mg | Freq: Once | INTRAVENOUS | Status: AC
  Administered 2023-12-24: 10 mg via INTRAVENOUS
  Filled 2023-12-24: qty 10

## 2023-12-24 MED ORDER — LEVETIRACETAM 500 MG PO TABS
750.0000 mg | ORAL_TABLET | Freq: Two times a day (BID) | ORAL | Status: DC
  Administered 2023-12-24 – 2023-12-26 (×4): 750 mg via ORAL
  Filled 2023-12-24 (×5): qty 1

## 2023-12-24 NOTE — ED Provider Triage Note (Signed)
 Emergency Medicine Provider Triage Evaluation Note  Melissa Sosa , a 78 y.o. female  was evaluated in triage.  Pt complains of pain shortness of breath that started 3 days ago.  Shortness of breath with exertion and orthopnea.  Chest pain more like chest tightness.  Noted to be in A-fib RVR.  Has had some dry cough with this but does not feel like she is bringing anything up.  Was diagnosed with A-fib 2 weeks ago and has been anticoagulated since.  Compliant with blood thinner.  Review of Systems  Positive: Chest pain shortness of breath cough Negative: Fever  Physical Exam  BP 133/85 (BP Location: Left Arm)   Pulse (!) 118   Temp 98.3 F (36.8 C)   Resp 17   Ht 5' 6 (1.676 m)   Wt 87.1 kg   SpO2 97%   BMI 30.99 kg/m  Gen:   Awake, no distress   Resp:  Normal effort  MSK:   Moves extremities without difficulty  Other:    Medical Decision Making  Medically screening exam initiated at 2:56 PM.  Appropriate orders placed.  Keitha Kolk was informed that the remainder of the evaluation will be completed by another provider, this initial triage assessment does not replace that evaluation, and the importance of remaining in the ED until their evaluation is complete.     Shermon Warren SAILOR, PA-C 12/24/23 1457

## 2023-12-24 NOTE — Consult Note (Addendum)
 Cardiology Consultation   Patient ID: Melissa Sosa MRN: [id number]; DOB: 1945-12-26  Admit date: 12/24/2023 Date of Consult: 12/24/2023  PCP:  Cristopher Bottcher, NP   [facility] Providers Cardiologist:  None        Patient Profile: Melissa Sosa is a 78 y.o. female with a hx of HFmrEF, atrial fibrillation, seizures, non-Hodgkin's lymphoma with chemo, uterine cancer, bilateral DCIS with lumpectomy, and obesity class I, who is being seen 12/24/2023 for the evaluation of chest pain, A-fib, heart failure at the request of Kathrin Simmer MD.  History of Present Illness: Melissa Sosa is a 78 year old female who per chart review has not been seen by cardiology in the past.  Was previously hospitalized on 12/09/2023 for atrial fibrillation with RVR, community-acquired pneumonia, and HFmrEF.  Echocardiogram during this hospitalization showed a moderately reduced LVEF of 40 to 45%, abnormal septal wall motion consistent with intraventricular conduction delay  Patient presented to the emergency department on 12/24/2023 with chest pain and shortness of breath.  Chest tightness is worse when she lays down flat.  Chest tightness is better when she gets up and walks.  The chest tightness is more related to heart failure than ACS/angina. Associated symptoms also include orthopnea, PND, dyspnea on exertion, and palpitations.  Denies fever, chills, diaphoresis, melena, hematuria, hematochezia.  Denies missing any doses of her Eliquis  in the past 2-weeks.   Labs showed potassium of 3.9, creatinine of 0.92, magnesium  of 2.1, elevated BNP of 1036.5, hemoglobin of 13.3, and albumin of 3.5, high-sensitivity troponin 17> 14.  Chest x-ray showed Small bilateral pleural effusions with bibasilar atelectasis.   EKG showed atrial fibrillation with a rate of 115, and an intraventricular conduction delay.  Past Medical History:  Diagnosis Date   Anxiety    Breast cancer (HCC)    Hypertension    PMR (polymyalgia  rheumatica) (HCC)    Pneumonia     History reviewed. No pertinent surgical history.   Home Medications:  Prior to Admission medications   Medication Sig Start Date End Date Taking? Authorizing Provider  acetaminophen  (TYLENOL ) 500 MG tablet Take 2 tablets (1,000 mg total) by mouth every 6 (six) hours as needed. 09/07/23  Yes Diona Perkins, MD  apixaban  (ELIQUIS ) 5 MG TABS tablet Take 1 tablet (5 mg total) by mouth 2 (two) times daily. 12/10/23 03/09/24 Yes Laurence Locus, DO  levETIRAcetam  (KEPPRA ) 750 MG tablet Take 1 tablet (750 mg total) by mouth 2 (two) times daily. 10/11/23 01/09/24 Yes Camara, Amadou, MD  metoprolol  tartrate (LOPRESSOR ) 25 MG tablet Take 0.5 tablets (12.5 mg total) by mouth 2 (two) times daily. Patient taking differently: Take 25 mg by mouth 2 (two) times daily. 12/10/23 03/09/24 Yes Laurence Locus, DO  Multiple Vitamin (MULTIVITAMIN) tablet Take 1 tablet by mouth daily.   Yes [provider]  nortriptyline  (PAMELOR ) 25 MG capsule Take 25 mg by mouth at bedtime.   Yes [provider]    Scheduled Meds:  apixaban   5 mg Oral BID   [START ON 12/25/2023] furosemide   40 mg Intravenous BID   levETIRAcetam   750 mg Oral BID   [START ON 12/25/2023] losartan   25 mg Oral Daily   metoprolol  tartrate  50 mg Oral BID   [START ON 12/25/2023] multivitamin with minerals  1 tablet Oral Daily   nortriptyline   25 mg Oral QHS   sodium chloride  flush  3 mL Intravenous Q12H   Continuous Infusions:  sodium chloride      PRN Meds: sodium chloride , acetaminophen , ondansetron  (  ZOFRAN ) IV, sodium chloride  flush  Allergies:    Allergies  Allergen Reactions   Sulfamethoxazole-Trimethoprim Hives and Rash   Venlafaxine Hives, Itching and Other (See Comments)    Headache   Imitrex [Sumatriptan]     hypertension   Morphine And Codeine Other (See Comments)    Hyper, confusion     Social History:   Social History   Socioeconomic History   Marital status: Widowed    Spouse name: Not  on file   Number of children: Not on file   Years of education: Not on file   Highest education level: Not on file  Occupational History   Not on file  Tobacco Use   Smoking status: Never   Smokeless tobacco: Never  Vaping Use   Vaping status: Never Used  Substance and Sexual Activity   Alcohol use: No   Drug use: Never   Sexual activity: Not on file  Other Topics Concern   Not on file  Social History Narrative   Not on file   Social Drivers of Health   Financial Resource Strain: Not on file  Food Insecurity: No Food Insecurity (12/24/2023)   Hunger Vital Sign    Worried About Running Out of Food in the Last Year: Never true    Ran Out of Food in the Last Year: Never true  Transportation Needs: No Transportation Needs (12/24/2023)   PRAPARE - Administrator, Civil Service (Medical): No    Lack of Transportation (Non-Medical): No  Physical Activity: Not on file  Stress: Not on file  Social Connections: Patient Declined (12/24/2023)   Social Connection and Isolation Panel    Frequency of Communication with Friends and Family: Patient declined    Frequency of Social Gatherings with Friends and Family: Patient declined    Attends Religious Services: Patient declined    Database administrator or Organizations: Patient declined    Attends Banker Meetings: Patient declined    Marital Status: Patient declined  Intimate Partner Violence: Not At Risk (12/24/2023)   Humiliation, Afraid, Rape, and Kick questionnaire    Fear of Current or Ex-Partner: No    Emotionally Abused: No    Physically Abused: No    Sexually Abused: No    Family History:   History reviewed. No pertinent family history.   ROS:  Please see the history of present illness.   All other ROS reviewed and negative.     Physical Exam/Data: Vitals:   12/24/23 1630 12/24/23 1730 12/24/23 1809 12/24/23 1822  BP: 132/78 (!) 144/112  (!) 132/94  Pulse:    (!) 116  Resp: (!) 24 (!) 23  20   Temp:   97.7 F (36.5 C) (!) 97.5 F (36.4 C)  TempSrc:   Oral Oral  SpO2: 98% 99%  96%  Weight:    91.4 kg  Height:    5' 6 (1.676 m)    Intake/Output Summary (Last 24 hours) at 12/24/2023 1937 Last data filed at 12/24/2023 1907 Gross per 24 hour  Intake --  Output 450 ml  Net -450 ml      12/24/2023    6:22 PM 12/24/2023    2:34 PM 12/10/2023    9:46 AM  Last 3 Weights  Weight (lbs) 201 lb 8 oz 192 lb 200 lb 3.2 oz  Weight (kg) 91.4 kg 87.091 kg 90.81 kg     Body mass index is 32.52 kg/m.  General:  Well nourished, well developed, in  no acute distress, alert and orientated on room air HEENT: normal Neck: Positive JVD to jaw Vascular: No carotid bruits; Distal pulses 2+ bilaterally Cardiac:  normal S1, S2; RRR; no murmur  Lungs: Bibasilar crackles Abd: soft, nontender, no hepatomegaly  Ext: 2+ bilateral lower extremity edema Musculoskeletal:  No deformities Skin: warm and dry  Neuro:   no focal abnormalities noted Psych:  Normal affect   EKG:  The EKG was personally reviewed and demonstrates:  atrial fibrillation with a rate of 115, and an intraventricular conduction delay. Telemetry:  Telemetry was personally reviewed and demonstrates: Atrial fibrillation with heart rates in the 100s to 120's.  Relevant CV Studies:  Echocardiogram on 12/10/2023 IMPRESSIONS     1. Left ventricular ejection fraction, by estimation, is 40 to 45%. The  left ventricle has mildly decreased function. The left ventricle  demonstrates regional wall motion abnormalities (see scoring  diagram/findings for description). Left ventricular  diastolic parameters are indeterminate. Abnormal septal motion consistent  with interventricular conduction delay.   2. Right ventricular systolic function is normal. The right ventricular  size is not well visualized. There is mildly elevated pulmonary artery  systolic pressure.   3. Left atrial size was mildly dilated.   4. The mitral valve is normal in  structure. Mild mitral valve  regurgitation. No evidence of mitral stenosis.   5. Tricuspid valve regurgitation is mild to moderate.   6. The aortic valve is tricuspid. Aortic valve regurgitation is not  visualized. No aortic stenosis is present.   7. The inferior vena cava is normal in size with <50% respiratory  variability, suggesting right atrial pressure of 8 mmHg.   Laboratory Data: High Sensitivity Troponin:   Recent Labs  Lab 12/24/23 1505 12/24/23 1640  TROPONINIHS 17 14     Chemistry Recent Labs  Lab 12/24/23 1620  NA 141  K 3.9  CL 106  CO2 23  GLUCOSE 105*  BUN 12  CREATININE 0.92  CALCIUM 9.4  MG 2.1  GFRNONAA >60  ANIONGAP 12    Recent Labs  Lab 12/24/23 1620  PROT 7.3  ALBUMIN 3.5  AST 41  ALT 21  ALKPHOS 84  BILITOT 1.0   Lipids No results for input(s): CHOL, TRIG, HDL, LABVLDL, LDLCALC, CHOLHDL in the last 168 hours.  Hematology Recent Labs  Lab 12/24/23 1505  WBC 7.5  RBC 4.56  HGB 13.3  HCT 41.5  MCV 91.0  MCH 29.2  MCHC 32.0  RDW 13.5  PLT 318   Thyroid  No results for input(s): TSH, FREET4 in the last 168 hours.  BNP Recent Labs  Lab 12/24/23 1505  BNP 1,036.5*    DDimer No results for input(s): DDIMER in the last 168 hours.  Radiology/Studies:  DG Chest 1 View Result Date: 12/24/2023 CLINICAL DATA:  Chest pain EXAM: CHEST  1 VIEW COMPARISON:  12/09/2023 FINDINGS: Heart and mediastinal contours within normal limits. Small bilateral pleural effusions with bibasilar opacities, likely atelectasis. No acute bony abnormality. IMPRESSION: Small bilateral pleural effusions with bibasilar atelectasis. Electronically Signed   By: [name] Crease M.D.   On: 12/24/2023 15:29     Assessment and Plan: Creta Dorame is a 78 y.o. female with a hx of HFmrEF, hypertension, atrial fibrillation, seizures, non-Hodgkin's lymphoma with chemo, uterine cancer, bilateral DCIS with lumpectomy, and obesity class I, who is being seen  12/24/2023 for the evaluation of chest pain, A-fib, heart failure at the request of Gonfa Taye MD.  Acute on chronic systolic heart failure Echocardiogram  at prior hospitalization showed a moderately reduced LVEF of 40 to 45%, abnormal septal wall motion consistent with intraventricular conduction delay. Presented to the emergency department with shortness of breath, extremity edema, and orthopnea.  Was started on 40 mg IV Lasix  twice daily.  Appeared volume overloaded on exam. Labs showed elevated BNP of 1036.5, Chest x-ray showed Small bilateral pleural effusions with bibasilar atelectasis.  Continue IV Lasix  40 mg twice daily. GDMT Plan to start SGLT2  Start metoprolol  as per atrial fibrillation below  Atrial fibrillation with RVR CHA2DS2-VASc Score = 5 [CHF History: 1, HTN History: 0, Diabetes History: 0, Stroke History: 0, Vascular Disease History: 1, Age Score: 2, Gender Score: 1].  Therefore, the patient's annual risk of stroke is 7.2 %.    Started on Eliquis  on 12/10/2023.  Denies missing any doses since Eliquis  was started. Was in normal sinus rhythm at discharge from prior hospitalization. Potassium 3.9, magnesium  2.1.  TSH normal at prior hospitalization 2 weeks ago.  Potassium replacement for goal potassium greater than 4. Continue Eliquis  5 mg twice daily (correct dose). Stop IV Cardizem  due to reduced EF Start metoprolol  tartrate 50 mg twice daily May consider doing a cardioversion if remains in A-fib once more euvolemic  Chest tightness Chest tightness is worse when she lays down flat.  Chest tightness is better when she gets up and walks.  The chest tightness is more related to heart failure than ACS/angina.  High-sensitivity troponins negative.  EKG showed no acute ischemic changes.  Otherwise manage per primary    Risk Assessment/Risk Scores:       New York  Heart Association (NYHA) Functional Class NYHA Class III  CHA2DS2-VASc Score = 5   This indicates a 7.2% annual  risk of stroke. The patient's score is based upon: CHF History: 1 HTN History: 0 Diabetes History: 0 Stroke History: 0 Vascular Disease History: 1 Age Score: 2 Gender Score: 1        For questions or updates, please contact [facility] Please consult [email or web address] for contact info under    Signed, Morse Clause, PA-C  12/24/2023 7:37 PM   Patient seen and examined.  Agree with above documentation.  Ms. Marcantonio is a 78 year old female with history of recently diagnosed systolic heart failure, atrial fibrillation, breast cancer, cervical cancer, non-Hodgkin's lymphoma, PMR, hypertension who we are consulted by Dr. Gonfa for evaluation of heart failure and atrial fibrillation.  Echocardiogram 08/2023 showed EF 55 to 60%, normal RV function, no significant valvular disease.  Admitted 12/09/2023 with shortness of breath, found to have new A-fib with RVR.  Echocardiogram showed EF 40 to 45%.  On 11/2020, spontaneously converted to sinus rhythm.  Started on Eliquis  and Lopressor  12.5 mg twice daily.  She presented back to the ED today with chest tightness, shortness of breath x 2 days.  Initial vital signs notable for BP 133/85, pulse 118, SpO2 97% on room air.  Labs notable for BNP 1037, troponin 17 > 14, creatinine 0.9, normal LFTs, hemoglobin 13, WBC 7.5, platelets 318.  Chest x-ray with small bilateral pleural effusions with bibasilar atelectasis.  EKG shows A-fib with RVR, rate 115, left bundle branch block. On exam, patient is alert and oriented, irregular rhythm, tachycardic, no murmurs, diminished breath sounds, 1+ LE edema, + JVD.  For her A-fib with RVR, can continue Eliquis  for anticoagulation.  She was started on diltiazem  drip.  Given her systolic dysfunction would favor avoiding diltiazem .  Will start p.o. metoprolol  50 mg twice daily for  rate control.  If she does not convert to sinus rhythm spontaneously as she did last admission, would plan cardioversion once more euvolemic.  For  her acute on chronic systolic heart failure, she appears volume overloaded on exam.  Continue IV Lasix  40 mg twice daily.  Can add GDMT as tolerated  Lonni LITTIE Nanas, MD

## 2023-12-24 NOTE — ED Triage Notes (Signed)
 Pt arrives via POV. Pt reports chest pain and associated sob since yesterday. States the pain has worsened today. Pt AxOx4.

## 2023-12-24 NOTE — H&P (Signed)
 History and Physical    Patient: Melissa Sosa FMW:[id number] DOB: 1946/05/04 DOA: 12/24/2023 DOS: the patient was seen and examined on 12/24/2023 PCP: Cristopher Bottcher, NP  Patient coming from: Home.  Independently ambulates at baseline  Chief Complaint:  Chief Complaint  Patient presents with   Chest Pain   Shortness of Breath   HPI: Melissa Sosa is a 78 y.o. female with PMH of A-fib on Eliquis , HFmrEFleft breast cancer s/p lumpectomy and radiation in remission, cervical cancer s/p hysterectomy, non-Hodgkin's lymphoma, HTN, PMR, osteoarthritis, anxiety, depression and obesity resenting with chest tightness, shortness of breath, DOE and orthopnea for 2 days  Patient was hospitalized from 7/21-7/22 with A-fib and discharged home on metoprolol  12.5 mg twice daily and Eliquis .  TTE at that time with LVEF of 40 to 45% and RWMA.  Followed by PCP after discharge and metoprolol  increased to 25 mg twice daily.  Also diagnosed with pneumonia for which she completed 7 days of Augmentin.  Now comes in with chest tightness, SOB, DOE and orthopnea for 2 days.  Also reports some increased edema in both leg.  She has some degree of chronic edema in left leg.  Denies exertional chest pain before this.  Denies history of CAD.  Denies fever, chills, abdominal pain or vomiting.  She had some nausea and diarrhea that she attributes to Augmentin.  Nausea and diarrhea has improved.  She denies UTI symptoms.  Reports compliance with metoprolol  and Eliquis .  Denies focal neurodeficit.  Patient denies smoking cigarettes, drinking alcohol recreational drug use.  She is interested in cardiopulmonary resuscitation in an event of sudden cardiopulmonary arrest  In ED, in A-fib with RVR to 120s.  Normotensive.  CMP and CBC without significant finding.  BNP 1000.  Troponin 17 and 14.  EKG features A-fib with RVR to 115.  CXR with small bilateral pleural effusion and bibasilar atelectasis.  Patient was started on Cardizem  drip and  IV Lasix .  Admission requested for A-fib with RVR and CHF.   Review of Systems: As mentioned in the history of present illness. All other systems reviewed and are negative. Past Medical History:  Diagnosis Date   Anxiety    Breast cancer (HCC)    Hypertension    PMR (polymyalgia rheumatica) (HCC)    Pneumonia    History reviewed. No pertinent surgical history. Social History:  reports that she has never smoked. She has never used smokeless tobacco. She reports that she does not drink alcohol and does not use drugs.  Allergies  Allergen Reactions   Sulfamethoxazole-Trimethoprim Hives and Rash   Venlafaxine Hives, Itching and Other (See Comments)    Headache   Imitrex [Sumatriptan]     hypertension   Morphine And Codeine Other (See Comments)    Hyper, confusion     History reviewed. No pertinent family history.  Prior to Admission medications   Medication Sig Start Date End Date Taking? Authorizing Provider  acetaminophen  (TYLENOL ) 500 MG tablet Take 2 tablets (1,000 mg total) by mouth every 6 (six) hours as needed. 09/07/23  Yes Diona Perkins, MD  apixaban  (ELIQUIS ) 5 MG TABS tablet Take 1 tablet (5 mg total) by mouth 2 (two) times daily. 12/10/23 03/09/24 Yes Laurence Locus, DO  levETIRAcetam  (KEPPRA ) 750 MG tablet Take 1 tablet (750 mg total) by mouth 2 (two) times daily. 10/11/23 01/09/24 Yes Camara, Amadou, MD  metoprolol  tartrate (LOPRESSOR ) 25 MG tablet Take 0.5 tablets (12.5 mg total) by mouth 2 (two) times daily. Patient taking differently: Take 25  mg by mouth 2 (two) times daily. 12/10/23 03/09/24 Yes Laurence Locus, DO  Multiple Vitamin (MULTIVITAMIN) tablet Take 1 tablet by mouth daily.   Yes [provider]  nortriptyline  (PAMELOR ) 25 MG capsule Take 25 mg by mouth at bedtime.   Yes [provider]    Physical Exam: Vitals:   12/24/23 1630 12/24/23 1730 12/24/23 1809 12/24/23 1822  BP: 132/78 (!) 144/112    Pulse:      Resp: (!) 24 (!) 23  20  Temp:   97.7  F (36.5 C) (!) 97.5 F (36.4 C)  TempSrc:   Oral Oral  SpO2: 98% 99%    Weight:    91.4 kg  Height:    5' 6 (1.676 m)   GENERAL: No apparent distress.  Nontoxic. HEENT: MMM.  Vision and hearing grossly intact.  NECK: Supple.  No apparent JVD.  RESP:  No IWOB.  Fair aeration bilaterally.  Rales over left lower lung fields. CVS: Irregular rhythm.  HR ranges from 110s to 130s. ABD/GI/GU: BS+. Abd soft, NTND.  MSK/EXT:   No apparent deformity. Moves extremities.  Trace BLE edema, left > right. SKIN: no apparent skin lesion or wound NEURO: Awake and alert. Oriented appropriately.  No apparent focal neuro deficit. PSYCH: Calm. Normal affect.  Data Reviewed: See HPI Assessment and Plan: Acute on chronic HFmrEF: Presents with dyspnea, DOE, orthopnea and edema.  TTE on 7/22 with LVEF of 40 to 45% and RWMA.  Prior TTE in 08/2023 with normal LVEF without RWMA.  BNP elevated to 1000.  CXR with small bilateral pleural effusion and bibasilar atelectasis.  Patient is not on diuretics at home.  IV Lasix  60 mg ordered in ED. - CHF pathway with IV Lasix  40 mg twice daily starting tomorrow - Strict intake and output, daily weight, renal functions and electrolytes -She may benefit from [facility] given RWMA on recent echo.  Cardiology consulted  A-fib with RVR: Unclear if CHF triggers A-fib or A-fib triggered the CHF.  Recent TTE as above.  Normal TSH.  Reports compliance with metoprolol  and Eliquis . - Continue Cardizem  drip for now - Continue Eliquis  for anticoagulation - Cardiology consulted as above - Optimize electrolyte  RWMA on TTE-has chest tightness likely due to CHF and A-fib.  Not diabetic.  Last LDL 87. - Cardiology consult  History of focal seizure: - Continue home Keppra   Anxiety and depression: Stable - Continue home meds  History of osteoarthritis - As needed Tylenol   History of left breast cancer s/p lumpectomy and radiation.  In remission.  History of cervical cancer s/p  hysterectomy.  In remission.  History of non-Hodgkin's lymphoma: Normal cell counts. - Outpatient follow-up     Advance Care Planning:   Code Status: Full Code-discussed with patient and patient's son at bedside  Consults: Cardiology  Family Communication: Updated patient's son at bedside  Severity of Illness: The appropriate patient status for this patient is OBSERVATION. Observation status is judged to be reasonable and necessary in order to provide the required intensity of service to ensure the patient's safety. The patient's presenting symptoms, physical exam findings, and initial radiographic and laboratory data in the context of their medical condition is felt to place them at decreased risk for further clinical deterioration. Furthermore, it is anticipated that the patient will be medically stable for discharge from the hospital within 2 midnights of admission.   Author: Mignon ONEIDA Bump, MD 12/24/2023 6:25 PM  For on call review [email or web address].

## 2023-12-24 NOTE — ED Provider Notes (Signed)
 [place] EMERGENCY DEPARTMENT AT [facility] Provider Note   CSN: [id number] Arrival date & time: 12/24/23  1420     Patient presents with: Chest Pain and Shortness of Breath   Melissa Sosa is a 78 y.o. female.   Patient is here with shortness of breath irregular heart rhythm.  Just recently diagnosed with A-fib has been compliant with her metoprolol  and Eliquis .  Has increased her dose of metoprolol  these last few days without much improvement.  She has noted shortness of breath with exertion and shortness of breath when she is laying flat.  She denies any major chest pain weakness numbness tingling.  No headaches.  No major weight gain.  She is not on any diuretic.  She was treated for lung infection recently.  The history is provided by the patient.       Prior to Admission medications   Medication Sig Start Date End Date Taking? Authorizing Provider  acetaminophen  (TYLENOL ) 500 MG tablet Take 2 tablets (1,000 mg total) by mouth every 6 (six) hours as needed. 09/07/23   Diona Perkins, MD  apixaban  (ELIQUIS ) 5 MG TABS tablet Take 1 tablet (5 mg total) by mouth 2 (two) times daily. 12/10/23 03/09/24  Laurence Locus, DO  levETIRAcetam  (KEPPRA ) 750 MG tablet Take 1 tablet (750 mg total) by mouth 2 (two) times daily. 10/11/23 01/09/24  Camara, Amadou, MD  metoprolol  tartrate (LOPRESSOR ) 25 MG tablet Take 0.5 tablets (12.5 mg total) by mouth 2 (two) times daily. 12/10/23 03/09/24  Laurence Locus, DO  Multiple Vitamin (MULTIVITAMIN) tablet Take 1 tablet by mouth daily.    [provider]  nortriptyline  (PAMELOR ) 25 MG capsule Take 25 mg by mouth at bedtime.    [provider]    Allergies: Sulfamethoxazole-trimethoprim, Venlafaxine, Imitrex [sumatriptan], and Morphine and codeine    Review of Systems  Updated Vital Signs BP 132/78   Pulse (!) 118   Temp 98.3 F (36.8 C)   Resp (!) 24   Ht 5' 6 (1.676 m)   Wt 87.1 kg   SpO2 98%   BMI 30.99 kg/m   Physical  Exam Vitals and nursing note reviewed.  Constitutional:      General: She is not in acute distress.    Appearance: She is well-developed. She is not ill-appearing.  HENT:     Head: Normocephalic and atraumatic.  Eyes:     Extraocular Movements: Extraocular movements intact.     Conjunctiva/sclera: Conjunctivae normal.     Pupils: Pupils are equal, round, and reactive to light.  Cardiovascular:     Rate and Rhythm: Normal rate and regular rhythm.     Pulses:          Radial pulses are 2+ on the right side and 2+ on the left side.     Heart sounds: Normal heart sounds. No murmur heard. Pulmonary:     Effort: Pulmonary effort is normal. No respiratory distress.     Breath sounds: Decreased breath sounds present.  Abdominal:     Palpations: Abdomen is soft.     Tenderness: There is no abdominal tenderness.  Musculoskeletal:        General: No swelling.     Cervical back: Normal range of motion and neck supple.     Right lower leg: Edema present.     Left lower leg: Edema present.  Skin:    General: Skin is warm and dry.     Capillary Refill: Capillary refill takes less than 2  seconds.  Neurological:     General: No focal deficit present.     Mental Status: She is alert.  Psychiatric:        Mood and Affect: Mood normal.     (all labs ordered are listed, but only abnormal results are displayed) Labs Reviewed  BRAIN NATRIURETIC PEPTIDE - Abnormal; Notable for the following components:      Result Value   B Natriuretic Peptide 1,036.5 (*)    All other components within normal limits  COMPREHENSIVE METABOLIC PANEL WITH GFR - Abnormal; Notable for the following components:   Glucose, Bld 105 (*)    All other components within normal limits  CBC  MAGNESIUM   TROPONIN I (HIGH SENSITIVITY)  TROPONIN I (HIGH SENSITIVITY)    EKG: EKG Interpretation Date/Time:  Tuesday December 24 2023 14:39:11 EDT Ventricular Rate:  115 PR Interval:    QRS Duration:  130 QT  Interval:  326 QTC Calculation: 450 R Axis:   41  Text Interpretation: Atrial fibrillation with rapid ventricular response Non-specific intra-ventricular conduction block Possible Anterolateral infarct , age undetermined Abnormal ECG When compared with ECG of 10-Dec-2023 16:55, PREVIOUS ECG IS PRESENT Confirmed by Ruthe Cornet [id number]) on 12/24/2023 4:12:08 PM  Radiology: [facility] Chest 1 View Result Date: 12/24/2023 CLINICAL DATA:  Chest pain EXAM: CHEST  1 VIEW COMPARISON:  12/09/2023 FINDINGS: Heart and mediastinal contours within normal limits. Small bilateral pleural effusions with bibasilar opacities, likely atelectasis. No acute bony abnormality. IMPRESSION: Small bilateral pleural effusions with bibasilar atelectasis. Electronically Signed   By: [name] Crease M.D.   On: 12/24/2023 15:29     .Critical Care  Performed by: Ruthe Cornet, DO Authorized by: Ruthe Cornet, DO   Critical care provider statement:    Critical care time (minutes):  35   Critical care was necessary to treat or prevent imminent or life-threatening deterioration of the following conditions: atrial fibrillation with rvr.   Critical care was time spent personally by me on the following activities:  Blood draw for specimens, development of treatment plan with patient or surrogate, discussions with primary provider, evaluation of patient's response to treatment, examination of patient, obtaining history from patient or surrogate, ordering and performing treatments and interventions, ordering and review of laboratory studies, ordering and review of radiographic studies, pulse oximetry, re-evaluation of patient's condition and review of old charts   I assumed direction of critical care for this patient from another provider in my specialty: no     Care discussed with: admitting provider      Medications Ordered in the ED  furosemide  (LASIX ) injection 60 mg (has no administration in time range)  diltiazem  (CARDIZEM ) 1 mg/mL  load via infusion 10 mg (has no administration in time range)    And  diltiazem  (CARDIZEM ) 125 mg in dextrose  5% 125 mL (1 mg/mL) infusion (has no administration in time range)                                    Medical Decision Making Risk Prescription drug management. Decision regarding hospitalization.   Melissa Sosa is here with shortness of breath.  Unremarkable vitals.  No fever.  EKG shows atrial fibrillation with RVR.  Heart rate between 120 and 140 in the room.  She had little bit of edema in her legs.  She was recently diagnosed with A-fib during hospital stay recently.  Thought to maybe be from pneumonia.  She  converted back to normal sinus rhythm therefore was not put on a diuretic at discharge.  But now she is back in A-fib having shortness of breath with exertion shortness of breath and lying flat.  She looks volume overloaded on exam.  Differential diagnoses likely A-fib with RVR causing volume overload again.  Seems less likely to be infectious process or ACS.  She is anticoagulated now and doubt PE.  Overall we will check CBC BMP troponin BNP chest x-ray.  Will start patient IV diltiazem  and IV Lasix  and reevaluate.  Chest x-ray per my review interpretation consistent with volume overload and bilateral pleural effusions.  BNP is 1000.  Troponin 17.  No significant leukocytosis or anemia.  She has no fever.  I doubt any infectious process.  Anticipate admission to medicine for further A-fib with RVR and volume overload care.  Awaiting CMP.  CMP is unremarkable.  To be admitted to medicine for further care.  This chart was dictated using voice recognition software.  Despite best efforts to proofread,  errors can occur which can change the documentation meaning.      Final diagnoses:  Atrial fibrillation with RVR (HCC)  Hypervolemia, unspecified hypervolemia type  Acute on chronic right-sided heart failure [facility])    ED Discharge Orders     None          Ruthe Cornet, DO 12/24/23 1717

## 2023-12-25 ENCOUNTER — Telehealth (HOSPITAL_COMMUNITY): Payer: Self-pay | Admitting: Pharmacy Technician

## 2023-12-25 ENCOUNTER — Other Ambulatory Visit (HOSPITAL_COMMUNITY): Payer: Self-pay

## 2023-12-25 DIAGNOSIS — Z6831 Body mass index (BMI) 31.0-31.9, adult: Secondary | ICD-10-CM | POA: Diagnosis not present

## 2023-12-25 DIAGNOSIS — Z8572 Personal history of non-Hodgkin lymphomas: Secondary | ICD-10-CM | POA: Diagnosis not present

## 2023-12-25 DIAGNOSIS — J9811 Atelectasis: Secondary | ICD-10-CM | POA: Diagnosis present

## 2023-12-25 DIAGNOSIS — Z853 Personal history of malignant neoplasm of breast: Secondary | ICD-10-CM | POA: Diagnosis not present

## 2023-12-25 DIAGNOSIS — I5023 Acute on chronic systolic (congestive) heart failure: Secondary | ICD-10-CM | POA: Diagnosis present

## 2023-12-25 DIAGNOSIS — I447 Left bundle-branch block, unspecified: Secondary | ICD-10-CM | POA: Diagnosis present

## 2023-12-25 DIAGNOSIS — I110 Hypertensive heart disease with heart failure: Secondary | ICD-10-CM | POA: Diagnosis present

## 2023-12-25 DIAGNOSIS — E66811 Obesity, class 1: Secondary | ICD-10-CM | POA: Diagnosis present

## 2023-12-25 DIAGNOSIS — Z7901 Long term (current) use of anticoagulants: Secondary | ICD-10-CM | POA: Diagnosis not present

## 2023-12-25 DIAGNOSIS — I4891 Unspecified atrial fibrillation: Secondary | ICD-10-CM | POA: Diagnosis present

## 2023-12-25 DIAGNOSIS — Z888 Allergy status to other drugs, medicaments and biological substances status: Secondary | ICD-10-CM | POA: Diagnosis not present

## 2023-12-25 DIAGNOSIS — R569 Unspecified convulsions: Secondary | ICD-10-CM | POA: Diagnosis present

## 2023-12-25 DIAGNOSIS — Z79899 Other long term (current) drug therapy: Secondary | ICD-10-CM | POA: Diagnosis not present

## 2023-12-25 DIAGNOSIS — M353 Polymyalgia rheumatica: Secondary | ICD-10-CM | POA: Diagnosis present

## 2023-12-25 DIAGNOSIS — Z881 Allergy status to other antibiotic agents status: Secondary | ICD-10-CM | POA: Diagnosis not present

## 2023-12-25 DIAGNOSIS — I480 Paroxysmal atrial fibrillation: Secondary | ICD-10-CM | POA: Diagnosis present

## 2023-12-25 DIAGNOSIS — Z8542 Personal history of malignant neoplasm of other parts of uterus: Secondary | ICD-10-CM | POA: Diagnosis not present

## 2023-12-25 DIAGNOSIS — F32A Depression, unspecified: Secondary | ICD-10-CM | POA: Diagnosis present

## 2023-12-25 DIAGNOSIS — N179 Acute kidney failure, unspecified: Secondary | ICD-10-CM | POA: Diagnosis present

## 2023-12-25 DIAGNOSIS — Z885 Allergy status to narcotic agent status: Secondary | ICD-10-CM | POA: Diagnosis not present

## 2023-12-25 DIAGNOSIS — Z923 Personal history of irradiation: Secondary | ICD-10-CM | POA: Diagnosis not present

## 2023-12-25 DIAGNOSIS — Z882 Allergy status to sulfonamides status: Secondary | ICD-10-CM | POA: Diagnosis not present

## 2023-12-25 DIAGNOSIS — I5082 Biventricular heart failure: Secondary | ICD-10-CM | POA: Diagnosis present

## 2023-12-25 DIAGNOSIS — F419 Anxiety disorder, unspecified: Secondary | ICD-10-CM | POA: Diagnosis present

## 2023-12-25 DIAGNOSIS — Z8541 Personal history of malignant neoplasm of cervix uteri: Secondary | ICD-10-CM | POA: Diagnosis not present

## 2023-12-25 HISTORY — DX: Acute on chronic systolic (congestive) heart failure: I50.23

## 2023-12-25 LAB — RENAL FUNCTION PANEL
Albumin: 3 g/dL — ABNORMAL LOW (ref 3.5–5.0)
Anion gap: 11 (ref 5–15)
BUN: 11 mg/dL (ref 8–23)
CO2: 26 mmol/L (ref 22–32)
Calcium: 9.3 mg/dL (ref 8.9–10.3)
Chloride: 105 mmol/L (ref 98–111)
Creatinine, Ser: 0.85 mg/dL (ref 0.44–1.00)
GFR, Estimated: >60 mL/min (ref >60)
Glucose, Bld: 95 mg/dL (ref 70–99)
Phosphorus: 3.1 mg/dL (ref 2.5–4.6)
Potassium: 3.8 mmol/L (ref 3.5–5.1)
Sodium: 142 mmol/L (ref 135–145)

## 2023-12-25 LAB — CBC
HCT: 40.7 % (ref 36.0–46.0)
Hemoglobin: 13.3 g/dL (ref 12.0–15.0)
MCH: 28.7 pg (ref 26.0–34.0)
MCHC: 32.7 g/dL (ref 30.0–36.0)
MCV: 87.9 fL (ref 80.0–100.0)
Platelets: 313 K/uL (ref 150–400)
RBC: 4.63 MIL/uL (ref 3.87–5.11)
RDW: 13.6 % (ref 11.5–15.5)
WBC: 6.4 K/uL (ref 4.0–10.5)
nRBC: 0 % (ref 0.0–0.2)

## 2023-12-25 LAB — MAGNESIUM: Magnesium: 1.9 mg/dL (ref 1.7–2.4)

## 2023-12-25 MED ORDER — SACUBITRIL-VALSARTAN 24-26 MG PO TABS
1.0000 | ORAL_TABLET | Freq: Two times a day (BID) | ORAL | Status: DC
  Administered 2023-12-25 – 2023-12-26 (×3): 1 via ORAL
  Filled 2023-12-25 (×3): qty 1

## 2023-12-25 MED ORDER — ADULT MULTIVITAMIN W/MINERALS CH
1.0000 | ORAL_TABLET | Freq: Every day | ORAL | Status: DC
  Administered 2023-12-25: 1 via ORAL

## 2023-12-25 MED ORDER — DAPAGLIFLOZIN PROPANEDIOL 10 MG PO TABS
10.0000 mg | ORAL_TABLET | Freq: Every day | ORAL | Status: DC
  Administered 2023-12-25 – 2023-12-26 (×2): 10 mg via ORAL
  Filled 2023-12-25 (×2): qty 1

## 2023-12-25 NOTE — Care Management Obs Status (Signed)
 MEDICARE OBSERVATION STATUS NOTIFICATION   Patient Details  Name: Melissa Sosa MRN: [id number] Date of Birth: 1945-08-04   Medicare Observation Status Notification Given:  Yes    Vonzell Arrie Sharps 12/25/2023, 8:50 AM

## 2023-12-25 NOTE — TOC CM/SW Note (Signed)
 Transition of Care [facility]) - Inpatient Brief Assessment   Patient Details  Name: Melissa Sosa MRN: [id number] Date of Birth: November 21, 1945  Transition of Care [facility]) CM/SW Contact:    Waddell Barnie Rama, RN Phone Number: 12/25/2023, 3:51 PM   Clinical Narrative: From home alone, has PCP and insurance on file, states has no HH services in place at this time , has a cane at home.  States family member (daughter or son)  will transport them home at [employer] and family is support system, states gets medications from [facility]  on [address] .  Pta self ambulatory with cane.   There are no IP CM needs identified  at this time.  Please place consult for IP CM needs.     Transition of Care Asessment: Insurance and Status: Insurance coverage has been reviewed   Home environment has been reviewed: home alone Prior level of function:: ambutlatory with cane Prior/Current Home Services: Current home services (cane) Social Drivers of Health Review: SDOH reviewed no interventions necessary Readmission risk has been reviewed: Yes Transition of care needs: no transition of care needs at this time

## 2023-12-25 NOTE — Plan of Care (Signed)
  Problem: Health Behavior/Discharge Planning: Goal: Ability to manage health-related needs will improve Outcome: Progressing   Problem: Clinical Measurements: Goal: Will remain free from infection Outcome: Progressing   Problem: Clinical Measurements: Goal: Diagnostic test results will improve Outcome: Progressing   

## 2023-12-25 NOTE — Progress Notes (Signed)
 PROGRESS NOTE  Melissa Sosa FMW:[id number] DOB: 02/26/1946   PCP: Cristopher Bottcher, NP  Patient is from: Home.  Uses cane at baseline.  DOA: 12/24/2023 LOS: 0  Chief complaints Chief Complaint  Patient presents with   Chest Pain   Shortness of Breath     Brief Narrative / Interim history: 78 y.o. 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   Patient was hospitalized from 7/21-7/22 with A-fib and discharged home on metoprolol  12.5 mg twice daily and Eliquis .  TTE at that time with LVEF of 40 to 45% and RWMA.  Followed by PCP after discharge and metoprolol  increased to 25 mg twice daily.  Also diagnosed with pneumonia for which she completed 7 days of Augmentin.  In ED, in A-fib with RVR to 120s.  Normotensive.  CMP and CBC without significant finding.  BNP 1000.  Troponin 17 and 14.  EKG features A-fib with RVR to 115.  CXR with small bilateral pleural effusion and bibasilar atelectasis.  Started on Cardizem  drip and IV Lasix .    Cardiology consulted and stopped Cardizem  and started metoprolol  for A-fib.  Patient converted to sinus rhythm.  Remains on IV Lasix  for CHF.   Subjective: Seen and examined earlier this morning.  No major events overnight or this morning.  Reports improvement in her breathing and chest tightness.  Had 2 L urine output/24 hours.  Objective: Vitals:   12/24/23 2007 12/25/23 0046 12/25/23 0428 12/25/23 0749  BP:  (!) 116/55 122/76 125/74  Pulse: (!) 106 78 80 81  Resp:  18 18 20   Temp:  98.1 F (36.7 C) (!) 97.5 F (36.4 C) 97.6 F (36.4 C)  TempSrc:  Oral Oral Oral  SpO2:  97% 91% 94%  Weight:   88.8 kg   Height:         Examination:  GENERAL: No apparent distress.  Nontoxic. HEENT: MMM.  Vision and hearing grossly intact.  NECK: Supple.  No apparent JVD.  RESP:  No IWOB.  Bibasilar Rales. CVS:  RRR. Heart sounds normal.  ABD/GI/GU: BS+. Abd soft, NTND.  MSK/EXT:  Moves extremities. No apparent deformity.  Trace BLE edema. SKIN: no apparent skin lesion or wound NEURO: AA.  Oriented appropriately.  No apparent focal neuro deficit. PSYCH: Calm. Normal affect.   Consultants:  Cardiology  Procedures: None  Microbiology summarized: None  Assessment and plan: Acute on chronic HFmrEF: Presents with dyspnea, DOE, orthopnea and edema.  TTE on 7/22 with LVEF of 40 to 45% and RWMA.  Prior TTE in 08/2023 with normal LVEF without RWMA.  BNP elevated to 1000.  CXR with small bilateral pleural effusion and bibasilar atelectasis.  Patient is not on diuretics at home.  Started on IV Lasix .  About 2 L UOP overnight.  Symptoms improved.  BP and creatinine stable. -Appreciate help by cardiology-on IV Lasix .  Started Entresto , Farxiga  and metoprolol . -Strict intake and output, daily weight, renal functions and electrolytes   Paroxysmal A-fib with RVR: Unclear if CHF triggers A-fib or A-fib triggered the CHF.  Recent TTE as above.  Normal TSH.  Reports compliance with metoprolol  and Eliquis .  Converted to sinus rhythm. 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  Not diabetic.  Last LDL 87. - Cardiac evaluation per cardiology   History of focal seizure: - Continue home Keppra    Anxiety and depression: Stable - Continue home meds   History of osteoarthritis - As needed Tylenol    History of left breast cancer s/p lumpectomy and radiation.  In remission.   History of cervical cancer s/p hysterectomy.  In remission.   History of non-Hodgkin's lymphoma: s/p chemotherapy. - Outpatient follow-up  Class I obesity Body mass index is  31.6 kg/m.           DVT prophylaxis:   apixaban  (ELIQUIS ) tablet 5 mg  Code Status: Full code Family Communication: Updated patient's son at bedside Level of care: Telemetry Cardiac Status is: Observation The patient will require care spanning > 2 midnights and should be moved to inpatient because: Acute CHF   Final disposition: Home   55 minutes with more than 50% spent in reviewing records, counseling patient/family and coordinating care.   Sch Meds:  Scheduled Meds:  apixaban   5 mg Oral BID   dapagliflozin  propanediol  10 mg Oral Daily   furosemide   40 mg Intravenous BID   levETIRAcetam   750 mg Oral BID   metoprolol  tartrate  50 mg Oral BID   multivitamin with minerals  1 tablet Oral Daily   nortriptyline   25 mg Oral QHS   sacubitril -valsartan   1 tablet Oral BID   sodium chloride  flush  3 mL Intravenous Q12H   Continuous Infusions:  sodium chloride      PRN Meds:.sodium chloride , acetaminophen , ondansetron  (ZOFRAN ) IV, sodium chloride  flush  Antimicrobials: Anti-infectives (From admission, onward)    None        I have personally reviewed the following labs and images: CBC: Recent Labs  Lab 12/24/23 1505 12/25/23 0223  WBC 7.5 6.4  HGB 13.3 13.3  HCT 41.5 40.7  MCV 91.0 87.9  PLT 318 313   BMP &GFR Recent Labs  Lab 12/24/23 1620 12/25/23 0223  NA 141 142  K 3.9 3.8  CL 106 105  CO2 23 26  GLUCOSE 105* 95  BUN 12 11  CREATININE 0.92 0.85  CALCIUM 9.4 9.3  MG 2.1 1.9  PHOS  --  3.1   Estimated Creatinine Clearance: 62.2 mL/min (by C-G formula based on SCr of 0.85 mg/dL). Liver & Pancreas: Recent Labs  Lab 12/24/23 1620 12/25/23 0223  AST 41  --   ALT 21  --   ALKPHOS 84  --   BILITOT 1.0  --   PROT 7.3  --   ALBUMIN 3.5 3.0*   No results for input(s): LIPASE, AMYLASE in the last 168 hours. No results for input(s): AMMONIA in the last 168 hours. Diabetic: No results for input(s): HGBA1C in the last 72 hours. No  results for input(s): GLUCAP in the last 168 hours. Cardiac Enzymes: No results for input(s): CKTOTAL, CKMB, CKMBINDEX, TROPONINI in the last 168 hours. No results for input(s): PROBNP in the last 8760 hours. Coagulation Profile: No results for input(s): INR, PROTIME in the last 168 hours. Thyroid  Function Tests: No results for input(s): TSH, T4TOTAL, FREET4, T3FREE, THYROIDAB in the last 72 hours. Lipid Profile: No results for input(s): CHOL, HDL, LDLCALC, TRIG, CHOLHDL, LDLDIRECT in the last 72 hours. Anemia Panel: No results for input(s): VITAMINB12, FOLATE, FERRITIN, TIBC, IRON, RETICCTPCT in the last 72 hours. Urine analysis:    Component Value Date/Time   COLORURINE YELLOW 09/03/2023 1137   APPEARANCEUR  CLEAR 09/03/2023 1137   LABSPEC >1.046 (H) 09/03/2023 1137   PHURINE 7.0 09/03/2023 1137   GLUCOSEU NEGATIVE 09/03/2023 1137   HGBUR NEGATIVE 09/03/2023 1137   BILIRUBINUR NEGATIVE 09/03/2023 1137   KETONESUR 20 (A) 09/03/2023 1137   PROTEINUR NEGATIVE 09/03/2023 1137   NITRITE NEGATIVE 09/03/2023 1137   LEUKOCYTESUR NEGATIVE 09/03/2023 1137   Sepsis Labs: Invalid input(s): PROCALCITONIN, LACTICIDVEN  Microbiology: No results found for this or any previous visit (from the past 240 hours).  Radiology Studies: DG Chest 1 View Result Date: 12/24/2023 CLINICAL DATA:  Chest pain EXAM: CHEST  1 VIEW COMPARISON:  12/09/2023 FINDINGS: Heart and mediastinal contours within normal limits. Small bilateral pleural effusions with bibasilar opacities, likely atelectasis. No acute bony abnormality. IMPRESSION: Small bilateral pleural effusions with bibasilar atelectasis. Electronically Signed   By: [name] Crease M.D.   On: 12/24/2023 15:29      Talton Delpriore T. Salimata Christenson [facility]  If 7PM-7AM, please contact night-coverage [email or web address] 12/25/2023, 11:15 AM

## 2023-12-25 NOTE — Telephone Encounter (Signed)
 Patient Product/process development scientist completed.    The patient is insured through [facility]. Patient has Medicare and is not eligible for a copay card, but may be able to apply for patient assistance or Medicare RX Payment Plan (Patient Must reach out to their plan, if eligible for payment plan), if available.    Ran test claim for Entresto 24-26 mg and the current 30 day co-pay is $45.00.  Ran test claim for Farxiga 10 mg and the current 30 day co-pay is $45.00.  Ran test claim for Jardiance 10 mg and the current 30 day co-pay is $45.00.  This test claim was processed through [facility]- copay amounts may vary at other pharmacies due to pharmacy/plan contracts, or as the patient moves through the different stages of their insurance plan.     Roland Earl, CPHT Pharmacy Technician III Certified Patient Advocate [facility] Patient Advocate Team Direct Number: [phone]  Fax: [phone]

## 2023-12-25 NOTE — Progress Notes (Signed)
 Mobility Specialist Progress Note:    12/25/23 1513  Mobility  Activity Ambulated with assistance  Level of Assistance Standby assist, set-up cues, supervision of patient - no hands on  Assistive Device Cane  Distance Ambulated (ft) 100 ft  Activity Response Tolerated well  Mobility Referral Yes  Mobility visit 1 Mobility  Mobility Specialist Start Time (ACUTE ONLY) 1513  Mobility Specialist Stop Time (ACUTE ONLY) 1540  Mobility Specialist Time Calculation (min) (ACUTE ONLY) 27 min   Pt agreeable to session. Pt able to move in bed and stand w/o assistance. C/o some SOB halfway through walk but manageable through PLB. Left pt on EOB w/ daughter in room and all needs met.   Venetia Keel Mobility Specialist Please Neurosurgeon or Rehab Office at [phone]

## 2023-12-25 NOTE — Progress Notes (Signed)
  Progress Note  Patient Name: Melissa Sosa Date of Encounter: 12/25/2023 [facility] Cardiologist: Lonni LITTIE Nanas, MD   Interval Summary   Breathing markedly improved, but she still has orthopnea with supine cough. Chest tightness has resolved.  Vital Signs Vitals:   12/24/23 2007 12/25/23 0046 12/25/23 0428 12/25/23 0749  BP:  (!) 116/55 122/76 125/74  Pulse: (!) 106 78 80 81  Resp:  18 18 20   Temp:  98.1 F (36.7 C) (!) 97.5 F (36.4 C) 97.6 F (36.4 C)  TempSrc:  Oral Oral Oral  SpO2:  97% 91% 94%  Weight:   88.8 kg   Height:        Intake/Output Summary (Last 24 hours) at 12/25/2023 0949 Last data filed at 12/25/2023 0300 Gross per 24 hour  Intake 120 ml  Output 1950 ml  Net -1830 ml      12/25/2023    4:28 AM 12/24/2023    6:22 PM 12/24/2023    2:34 PM  Last 3 Weights  Weight (lbs) 195 lb 12.3 oz 201 lb 8 oz 192 lb  Weight (kg) 88.8 kg 91.4 kg 87.091 kg      Telemetry/ECG  Converted to sinus rhythm around 20 hours yesterday, no recurrence of atrial fibrillation since then.- Personally Reviewed ECG which shows nonspecific IVCD, most closely resembling an atypical LBBB with QRS duration 100 seconds  Physical Exam  GEN: No acute distress.   Neck: 6-7 cm JVD Cardiac: RRR, no murmurs, rubs, or gallops.  Respiratory: Bilateral most rales at bases GI: Soft, nontender, non-distended  MS: 2+ bilateral ankle and pedal edema, slightly worse on the right  Assessment & Plan  Acute on chronic HFrEF: Probably needs another 7-8 lb diuresis.  Blood pressure seems to be holding out okay.  Will start the lowest dose Entresto  instead of losartan , as well as dapagliflozin .  Continue metoprolol .  Consider starting spironolactone tomorrow if blood pressure allows.  Discussed dietary sodium restriction, daily weight monitoring, signs and symptoms of heart exacerbation.  Unclear whether she has tachycardia cardiomyopathy.  Decubitus angina has resolved with diuresis.   Ischemic cardiomyopathy appears less likely (global LV hypokinesis, minimal evidence of aortic atherosclerosis on previous imaging studies, no ischemic ECG changes, normal cardiac enzymes), but she will require some type of evaluation in the near future.  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 Parox Afib: Spontaneously resolved overnight.  Continue beta-blocker.  I will not start antiarrhythmics at this point.  She is fully anticoagulated.  Discussed the relationship between atrial fibrillation and heart failure.  Encouraged her to get some type of personal electronic rhythm monitoring device such as a smart watch or Kardia. IVCD: May be rate-related, recheck ECG in NSR.  Had narrow complex QRS on EKG in April.  Option for CRT with future if heart failure should become difficult to manage LVEF drops further, but QRS is not particularly broad.   For questions or updates, please contact [facility] Please consult [email or web address] for contact info under       Signed, Jerel Balding, MD

## 2023-12-26 ENCOUNTER — Other Ambulatory Visit (HOSPITAL_COMMUNITY): Payer: Self-pay

## 2023-12-26 ENCOUNTER — Other Ambulatory Visit: Payer: Self-pay | Admitting: Physician Assistant

## 2023-12-26 DIAGNOSIS — I5023 Acute on chronic systolic (congestive) heart failure: Secondary | ICD-10-CM | POA: Diagnosis not present

## 2023-12-26 DIAGNOSIS — I5020 Unspecified systolic (congestive) heart failure: Secondary | ICD-10-CM

## 2023-12-26 LAB — CBC
HCT: 45.5 % (ref 36.0–46.0)
Hemoglobin: 14.9 g/dL (ref 12.0–15.0)
MCH: 29 pg (ref 26.0–34.0)
MCHC: 32.7 g/dL (ref 30.0–36.0)
MCV: 88.5 fL (ref 80.0–100.0)
Platelets: 344 K/uL (ref 150–400)
RBC: 5.14 MIL/uL — ABNORMAL HIGH (ref 3.87–5.11)
RDW: 13.4 % (ref 11.5–15.5)
WBC: 7.7 K/uL (ref 4.0–10.5)
nRBC: 0 % (ref 0.0–0.2)

## 2023-12-26 LAB — RENAL FUNCTION PANEL
Albumin: 3.2 g/dL — ABNORMAL LOW (ref 3.5–5.0)
Anion gap: 10 (ref 5–15)
BUN: 15 mg/dL (ref 8–23)
CO2: 31 mmol/L (ref 22–32)
Calcium: 9.6 mg/dL (ref 8.9–10.3)
Chloride: 99 mmol/L (ref 98–111)
Creatinine, Ser: 1.23 mg/dL — ABNORMAL HIGH (ref 0.44–1.00)
GFR, Estimated: 45 mL/min — ABNORMAL LOW (ref >60)
Glucose, Bld: 144 mg/dL — ABNORMAL HIGH (ref 70–99)
Phosphorus: 3.4 mg/dL (ref 2.5–4.6)
Potassium: 3.5 mmol/L (ref 3.5–5.1)
Sodium: 140 mmol/L (ref 135–145)

## 2023-12-26 LAB — MAGNESIUM: Magnesium: 2 mg/dL (ref 1.7–2.4)

## 2023-12-26 MED ORDER — FUROSEMIDE 40 MG PO TABS
ORAL_TABLET | ORAL | 0 refills | Status: DC
  Filled 2023-12-26: qty 90, 90d supply, fill #0

## 2023-12-26 MED ORDER — DAPAGLIFLOZIN PROPANEDIOL 10 MG PO TABS
10.0000 mg | ORAL_TABLET | Freq: Every day | ORAL | 0 refills | Status: DC
  Filled 2023-12-26: qty 30, 30d supply, fill #0

## 2023-12-26 MED ORDER — METOPROLOL TARTRATE 50 MG PO TABS
50.0000 mg | ORAL_TABLET | Freq: Two times a day (BID) | ORAL | 0 refills | Status: DC
  Filled 2023-12-26: qty 180, 90d supply, fill #0

## 2023-12-26 MED ORDER — SACUBITRIL-VALSARTAN 24-26 MG PO TABS
1.0000 | ORAL_TABLET | Freq: Two times a day (BID) | ORAL | 0 refills | Status: DC
  Filled 2023-12-26: qty 60, 30d supply, fill #0

## 2023-12-26 MED ORDER — LIVING BETTER WITH HEART FAILURE BOOK: Freq: Once | Status: AC

## 2023-12-26 NOTE — Plan of Care (Signed)
  Problem: Education: Goal: Knowledge of General Education information will improve Description: Including pain rating scale, medication(s)/side effects and non-pharmacologic comfort measures Outcome: Progressing   Problem: Clinical Measurements: Goal: Ability to maintain clinical measurements within normal limits will improve Outcome: Progressing   Problem: Clinical Measurements: Goal: Will remain free from infection Outcome: Progressing   Problem: Clinical Measurements: Goal: Diagnostic test results will improve Outcome: Progressing   Problem: Nutrition: Goal: Adequate nutrition will be maintained Outcome: Progressing   Problem: Activity: Goal: Risk for activity intolerance will decrease Outcome: Progressing   Problem: Pain Managment: Goal: General experience of comfort will improve and/or be controlled Outcome: Progressing   Problem: Elimination: Goal: Will not experience complications related to urinary retention Outcome: Progressing

## 2023-12-26 NOTE — TOC Transition Note (Addendum)
 Transition of Care [facility]) - Discharge Note   Patient Details  Name: Melissa Sosa MRN: [id number] Date of Birth: 05-19-1946  Transition of Care [facility]) CM/SW Contact:  Waddell Barnie Rama, RN Phone Number: 12/26/2023, 10:58 AM   Clinical Narrative:    For dc today, she has transport home, she will be on entresto ,  and farxiga  the co pay is 45.00 per test claim by pharmacy for each med.  Patient states would like to have [facility] for disease managemengt.  NCM offered choice, she chose [facility].  NCM made referral to [facility] , she is able to take referral.  Soc will begin 24 to 48 hrs post dc.         Patient Goals and CMS Choice            Discharge Placement                       Discharge Plan and Services Additional resources added to the After Visit Summary for                                       Social Drivers of Health (SDOH) Interventions SDOH Screenings   Food Insecurity: No Food Insecurity (12/24/2023)  Housing: Low Risk  (12/24/2023)  Transportation Needs: No Transportation Needs (12/24/2023)  Utilities: Not At Risk (12/24/2023)  Social Connections: Patient Declined (12/24/2023)  Tobacco Use: Low Risk  (12/24/2023)     Readmission Risk Interventions    12/25/2023    3:48 PM  Readmission Risk Prevention Plan  Transportation Screening Complete  PCP or Specialist Appt within 5-7 Days Complete  Home Care Screening Complete  Medication Review (RN CM) Complete

## 2023-12-26 NOTE — Discharge Summary (Signed)
 Physician Discharge Summary  Melissa Sosa FMW:[id number] DOB: 04-09-1946 DOA: 12/24/2023  PCP: Cristopher Bottcher, NP  Admit date: 12/24/2023 Discharge date: 12/26/23  Admitted From: Home Disposition: Home Recommendations for Outpatient Follow-up:  Outpatient follow-up with PCP and cardiology as below Check BP, fluid status, CMP and CBC at follow-up Please follow up on the following pending results: None  Home Health: [facility] RN for CHF management Equipment/Devices: None  Discharge Condition: Stable CODE STATUS: Full code Diet Orders (From admission, onward)     Start     Ordered   12/26/23 0000  Diet - low sodium heart healthy        12/26/23 1054   12/24/23 1811  Diet Heart Room service appropriate? Yes; Fluid consistency: Thin; Fluid restriction: 1500 mL Fluid  Diet effective now       Question Answer Comment  Room service appropriate? Yes   Fluid consistency: Thin   Fluid restriction: 1500 mL Fluid      12/24/23 1811             Follow-up Information     Ricky Pax T, DO Follow up on 01/03/2024.   Specialty: Family Medicine Why: 11:45 for hospital follow up Contact information: [address], [address] [place] [address] [phone]         Kate Lonni CROME, MD Follow up.   Specialties: Cardiology, Radiology Why: cardiology scheduler will contact you to arrange follow up in 2 weeks and outpatient echo in 2 month and another follow up after the echo. Please give us  a call if you do not hear from our scheduler in 3 business days Contact information: [address] [place] [address] [phone]         [facility] Follow up.   Why: Agency will call you to set up apt times. Contact information: [address] [phone]                Hospital course 78 y.o. 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   Patient was hospitalized from 7/21-7/22 with A-fib and discharged home on metoprolol  12.5 mg twice daily and Eliquis .  TTE at that time with LVEF of 40 to 45% and RWMA.  Followed by PCP after discharge and metoprolol  increased to 25 mg twice daily.  Also diagnosed with pneumonia for which she completed 7 days of Augmentin.   In ED, in A-fib with RVR to 120s.  Normotensive.  CMP and CBC without significant finding.  BNP 1000.  Troponin 17 and 14.  EKG features A-fib with RVR to 115.  CXR with small bilateral pleural effusion and bibasilar atelectasis.  Started on Cardizem  drip and IV Lasix .     Cardiology consulted and stopped Cardizem  and started metoprolol  for A-fib.  Patient converted to sinus rhythm overnight.  The next day, continued on IV Lasix  and started on Entresto  and Farxiga .  On the day of discharge, patient was diuresed with IV Lasix  with net negative of 4.6 L.  Weight down from 201 pounds to 189 pounds.  Cleared for discharge by cardiology on Entresto , Farxiga , metoprolol  and as needed Lasix  with instruction.  Outpatient follow-up as above.  See individual problem list below for more.  Problems addressed during this hospitalization Acute on chronic HFmrEF: Presents with dyspnea, DOE, orthopnea and edema.  TTE on 7/22 with LVEF of 40 to 45% and RWMA.  Prior TTE in 08/2023 with normal LVEF without RWMA.  BNP elevated to 1000.  CXR with small bilateral pleural effusion and bibasilar atelectasis.  Patient is not on diuretics at home.  Started on IV Lasix .  Diuresed about 4.6 L.  Weight down from 201 to 189 pounds.  Mild AKI.  - Cleared for discharge by cardiology on Entresto , Farxiga , metoprolol  and as needed Lasix .  - Outpatient follow-up with cardiology   Paroxysmal A-fib with RVR:  Unclear if CHF triggers A-fib or A-fib triggered the CHF.  Recent TTE as above.  Normal TSH.  Reports compliance with metoprolol  and Eliquis .  Converted and remained in sinus rhythm. 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  Not diabetic.  Last LDL 87. - Defer to cardiology.   History of focal seizure: - Continue home Keppra    Anxiety and depression: Stable - Continue home meds   History of osteoarthritis - As needed Tylenol    History of left breast cancer s/p lumpectomy and radiation.  In remission.   History of cervical cancer s/p hysterectomy.  In remission.   History of non-Hodgkin's lymphoma: s/p chemotherapy. - Outpatient follow-up   Class I obesity Body mass index is 30.6 kg/m.           Consultations: Cardiology  Time spent 35  minutes  Vital signs Vitals:   12/25/23 2117 12/26/23 0040 12/26/23 0342 12/26/23 0725  BP: (!) 122/48 (!) 113/44 117/62 123/62  Pulse: 87 75 80 74  Temp:  98.2 F (36.8 C) 97.8 F (36.6 C) 97.8 F (36.6 C)  Resp:  18 18 18   Height:      Weight:   86 kg   SpO2:  93% 94% 94%  TempSrc:  Oral Oral Oral  BMI (Calculated):   30.62      Discharge exam  GENERAL: No apparent distress.  Nontoxic. HEENT: MMM.  Vision and hearing grossly intact.  NECK: Supple.  No apparent JVD.  RESP:  No IWOB.  Fair aeration bilaterally. CVS:  RRR. Heart sounds normal.  ABD/GI/GU: BS+. Abd soft, NTND.  MSK/EXT:  Moves extremities. No apparent deformity. No edema.  SKIN: no apparent skin lesion or wound NEURO: Awake and alert. Oriented appropriately.  No apparent focal neuro deficit. PSYCH: Calm. Normal affect.   Discharge Instructions Discharge Instructions     Diet - low sodium heart healthy   Complete by: As directed    Discharge instructions   Complete by: As directed    It has been a pleasure taking care of you!  You were hospitalized due to  atrial fibrillation and heart failure for which you have been treated with medications.  Your heart rhythm has normalized.  Your breathing and chest discomfort improved.  We have started you on new medications.  Please review your new medication list and the directions on your medications before you take them. Follow-up with your primary care doctor in 1 to 2 weeks or sooner if needed. Follow-up with cardiology per their recommendation.   Take care,   Increase activity slowly   Complete by: As directed       Allergies as of 12/26/2023       Reactions   Sulfamethoxazole-trimethoprim Hives, Rash  Venlafaxine Hives, Itching, Other (See Comments)   Headache   Imitrex [sumatriptan]    hypertension   Morphine And Codeine Other (See Comments)   Hyper, confusion         Medication List     TAKE these medications    acetaminophen  500 MG tablet Commonly known as: TYLENOL  Take 2 tablets (1,000 mg total) by mouth every 6 (six) hours as needed.   Eliquis  5 MG Tabs tablet Generic drug: apixaban  Take 1 tablet (5 mg total) by mouth 2 (two) times daily.   Entresto  24-26 MG Generic drug: sacubitril -valsartan  Take 1 tablet by mouth 2 (two) times daily.   Farxiga  10 MG Tabs tablet Generic drug: dapagliflozin  propanediol Take 1 tablet (10 mg total) by mouth daily. Start taking on: December 27, 2023   furosemide  40 MG tablet Commonly known as: Lasix  Take one tablet  (40 mg) for weight gain greater than 3 pounds in 24 hours or 5 pounds in a week.   levETIRAcetam  750 MG tablet Commonly known as: KEPPRA  Take 1 tablet (750 mg total) by mouth 2 (two) times daily.   metoprolol  tartrate 50 MG tablet Commonly known as: LOPRESSOR  Take 1 tablet (50 mg total) by mouth 2 (two) times daily. What changed:  medication strength how much to take   multivitamin tablet Take 1 tablet by mouth daily.   nortriptyline  25 MG capsule Commonly known as: PAMELOR  Take 25 mg by mouth at bedtime.          Procedures/Studies:   DG Chest 1 View Result Date: 12/24/2023 CLINICAL DATA:  Chest pain EXAM: CHEST  1 VIEW COMPARISON:  12/09/2023 FINDINGS: Heart and mediastinal contours within normal limits. Small bilateral pleural effusions with bibasilar opacities, likely atelectasis. No acute bony abnormality. IMPRESSION: Small bilateral pleural effusions with bibasilar atelectasis. Electronically Signed   By: [name] Crease M.D.   On: 12/24/2023 15:29   ECHOCARDIOGRAM COMPLETE Result Date: 12/10/2023    ECHOCARDIOGRAM REPORT   Patient Name:   RAGAN DUHON Date of Exam: 12/10/2023 Medical Rec #:  [id number]     Height:       66.0 in Accession #:    [id number]    Weight:       192.0 lb Date of Birth:  07/17/1945      BSA:          1.965 m Patient Age:    78 years      BP:           111/69 mmHg Patient Gender: F             HR:           81 bpm. Exam Location:  Inpatient Procedure: 2D Echo, Color Doppler, Cardiac Doppler and Intracardiac            Opacification Agent (Both Spectral and Color Flow Doppler were            utilized during procedure). Indications:    Atrial Fibrillation I48.91  History:        Patient has prior history of Echocardiogram examinations, most                 recent 09/04/2023.  Sonographer:    Tinnie Gosling RDCS Referring Phys: [id number] DORN DAWSON IMPRESSIONS  1. Left ventricular ejection fraction, by estimation, is 40 to 45%. The left ventricle has mildly decreased function. The left ventricle demonstrates regional wall motion abnormalities (see scoring diagram/findings for description). Left ventricular diastolic parameters are indeterminate.  Abnormal septal motion consistent with interventricular conduction delay.  2. Right ventricular systolic function is normal. The right ventricular size is not well visualized. There is mildly elevated pulmonary artery systolic pressure.  3. Left atrial size was mildly dilated.  4. The mitral valve is normal in structure. Mild mitral valve  regurgitation. No evidence of mitral stenosis.  5. Tricuspid valve regurgitation is mild to moderate.  6. The aortic valve is tricuspid. Aortic valve regurgitation is not visualized. No aortic stenosis is present.  7. The inferior vena cava is normal in size with <50% respiratory variability, suggesting right atrial pressure of 8 mmHg. Comparison(s): Prior images reviewed side by side. Ventricular function has decreased from prior. This study is more technically difficult. FINDINGS  Left Ventricle: Left ventricular ejection fraction, by estimation, is 40 to 45%. The left ventricle has mildly decreased function. The left ventricle demonstrates regional wall motion abnormalities. Definity  contrast agent was given IV to delineate the left ventricular endocardial borders. The left ventricular internal cavity size was normal in size. There is no left ventricular hypertrophy. Left ventricular diastolic parameters are indeterminate.  LV Wall Scoring: Abnormal septal motion consistent with interventricular conduction delay. Right Ventricle: The right ventricular size is not well visualized. No increase in right ventricular wall thickness. Right ventricular systolic function is normal. There is mildly elevated pulmonary artery systolic pressure. The tricuspid regurgitant velocity is 2.73 m/s, and with an assumed right atrial pressure of 8 mmHg, the estimated right ventricular systolic pressure is 37.8 mmHg. Left Atrium: Left atrial size was mildly dilated. Right Atrium: Right atrial size was normal in size. Pericardium: There is no evidence of pericardial effusion. Mitral Valve: The mitral valve is normal in structure. Mild mitral valve regurgitation. No evidence of mitral valve stenosis. Tricuspid Valve: The tricuspid valve is normal in structure. Tricuspid valve regurgitation is mild to moderate. No evidence of tricuspid stenosis. Aortic Valve: The aortic valve is tricuspid. Aortic valve regurgitation is not visualized. No  aortic stenosis is present. Pulmonic Valve: The pulmonic valve was normal in structure. Pulmonic valve regurgitation is mild. No evidence of pulmonic stenosis. Aorta: The aortic root and ascending aorta are structurally normal, with no evidence of dilitation. Pulmonary Artery: The pulmonary artery is of normal size. Venous: The inferior vena cava is normal in size with less than 50% respiratory variability, suggesting right atrial pressure of 8 mmHg. IAS/Shunts: The atrial septum is grossly normal.  LEFT VENTRICLE PLAX 2D LVIDd:         4.60 cm LVIDs:         3.40 cm LV PW:         1.00 cm LV IVS:        1.00 cm LVOT diam:     2.00 cm LV SV:         39 LV SV Index:   20 LVOT Area:     3.14 cm  RIGHT VENTRICLE RV S prime:     8.70 cm/s TAPSE (M-mode): 1.2 cm LEFT ATRIUM             Index        RIGHT ATRIUM           Index LA diam:        4.00 cm 2.04 cm/m   RA Area:     14.60 cm LA Vol (A2C):   79.3 ml 40.35 ml/m  RA Volume:   37.40 ml  19.03 ml/m LA Vol (A4C):   70.6 ml 35.92 ml/m  LA Biplane Vol: 75.9 ml 38.62 ml/m  AORTIC VALVE LVOT Vmax:   73.20 cm/s LVOT Vmean:  47.600 cm/s LVOT VTI:    0.125 m  AORTA Ao Root diam: 2.80 cm Ao Asc diam:  3.10 cm MITRAL VALVE                TRICUSPID VALVE MV Area (PHT): 4.26 cm     TR Peak grad:   29.8 mmHg MV Decel Time: 178 msec     TR Vmax:        273.00 cm/s MV E velocity: 120.00 cm/s MV A velocity: 71.00 cm/s   SHUNTS MV E/A ratio:  1.69         Systemic VTI:  0.12 m                             Systemic Diam: 2.00 cm Stanly Leavens MD Electronically signed by Stanly Leavens MD Signature Date/Time: 12/10/2023/9:48:51 AM    Final    DG Chest 2 View Result Date: 12/09/2023 CLINICAL DATA:  Shortness of breath. EXAM: CHEST - 2 VIEW COMPARISON:  December 02, 2023 FINDINGS: The heart size and mediastinal contours are within normal limits. Low lung volumes are noted. Mild, stable left basilar scarring, atelectasis and/or infiltrate is seen. Very mild right basilar  atelectasis is also noted. There is a small right pleural effusion. No pneumothorax is identified. The visualized skeletal structures are unremarkable. IMPRESSION: 1. Low lung volumes with mild, bibasilar scarring, atelectasis and/or infiltrate, left greater than right. 2. Small right pleural effusion. Electronically Signed   By: Suzen Dials M.D.   On: 12/09/2023 18:58       The results of significant diagnostics from this hospitalization (including imaging, microbiology, ancillary and laboratory) are listed below for reference.     Microbiology: No results found for this or any previous visit (from the past 240 hours).   Labs:  CBC: Recent Labs  Lab 12/24/23 1505 12/25/23 0223 12/26/23 0226  WBC 7.5 6.4 7.7  HGB 13.3 13.3 14.9  HCT 41.5 40.7 45.5  MCV 91.0 87.9 88.5  PLT 318 313 344   BMP &GFR Recent Labs  Lab 12/24/23 1620 12/25/23 0223 12/26/23 0226  NA 141 142 140  K 3.9 3.8 3.5  CL 106 105 99  CO2 23 26 31   GLUCOSE 105* 95 144*  BUN 12 11 15   CREATININE 0.92 0.85 1.23*  CALCIUM 9.4 9.3 9.6  MG 2.1 1.9 2.0  PHOS  --  3.1 3.4   Estimated Creatinine Clearance: 42.3 mL/min (A) (by C-G formula based on SCr of 1.23 mg/dL (H)). Liver & Pancreas: Recent Labs  Lab 12/24/23 1620 12/25/23 0223 12/26/23 0226  AST 41  --   --   ALT 21  --   --   ALKPHOS 84  --   --   BILITOT 1.0  --   --   PROT 7.3  --   --   ALBUMIN 3.5 3.0* 3.2*   No results for input(s): LIPASE, AMYLASE in the last 168 hours. No results for input(s): AMMONIA in the last 168 hours. Diabetic: No results for input(s): HGBA1C in the last 72 hours. No results for input(s): GLUCAP in the last 168 hours. Cardiac Enzymes: No results for input(s): CKTOTAL, CKMB, CKMBINDEX, TROPONINI in the last 168 hours. No results for input(s): PROBNP in the last 8760 hours. Coagulation Profile: No results for input(s): INR, PROTIME in the last  168 hours. Thyroid  Function Tests: No  results for input(s): TSH, T4TOTAL, FREET4, T3FREE, THYROIDAB in the last 72 hours. Lipid Profile: No results for input(s): CHOL, HDL, LDLCALC, TRIG, CHOLHDL, LDLDIRECT in the last 72 hours. Anemia Panel: No results for input(s): VITAMINB12, FOLATE, FERRITIN, TIBC, IRON, RETICCTPCT in the last 72 hours. Urine analysis:    Component Value Date/Time   COLORURINE YELLOW 09/03/2023 1137   APPEARANCEUR CLEAR 09/03/2023 1137   LABSPEC >1.046 (H) 09/03/2023 1137   PHURINE 7.0 09/03/2023 1137   GLUCOSEU NEGATIVE 09/03/2023 1137   HGBUR NEGATIVE 09/03/2023 1137   BILIRUBINUR NEGATIVE 09/03/2023 1137   KETONESUR 20 (A) 09/03/2023 1137   PROTEINUR NEGATIVE 09/03/2023 1137   NITRITE NEGATIVE 09/03/2023 1137   LEUKOCYTESUR NEGATIVE 09/03/2023 1137   Sepsis Labs: Invalid input(s): PROCALCITONIN, LACTICIDVEN   SIGNED:  Phineas Mcenroe T Jeremia Groot, MD  [facility] 12/26/2023, 2:31 PM

## 2023-12-26 NOTE — Progress Notes (Signed)
 Progress Note  Patient Name: Melissa Sosa Date of Encounter: 12/26/2023 [facility] Cardiologist: Lonni LITTIE Nanas, MD   Interval Summary   Feels better, just very anxious overnight.  No true orthopnea. Excellent diuresis, net -4.5 L, weight down 12 pounds. Slight increase in BUN and creatinine. Diastolic blood pressure in the 40s precludes additional heart failure medication titration at this time, but she is asymptomatic. Remains in normal sinus rhythm.  ECG still shows left bundle branch block.  Vital Signs Vitals:   12/25/23 2117 12/26/23 0040 12/26/23 0342 12/26/23 0725  BP: (!) 122/48 (!) 113/44 117/62 123/62  Pulse: 87 75 80 74  Resp:  18 18 18   Temp:  98.2 F (36.8 C) 97.8 F (36.6 C) 97.8 F (36.6 C)  TempSrc:  Oral Oral Oral  SpO2:  93% 94% 94%  Weight:   86 kg   Height:        Intake/Output Summary (Last 24 hours) at 12/26/2023 0931 Last data filed at 12/26/2023 0854 Gross per 24 hour  Intake 240 ml  Output 2875 ml  Net -2635 ml      12/26/2023    3:42 AM 12/25/2023    4:28 AM 12/24/2023    6:22 PM  Last 3 Weights  Weight (lbs) 189 lb 9.6 oz 195 lb 12.3 oz 201 lb 8 oz  Weight (kg) 86.002 kg 88.8 kg 91.4 kg      Telemetry/ECG  Sinus rhythm- Personally Reviewed  Physical Exam  GEN: No acute distress.   Neck: No JVD Cardiac: RRR, no murmurs, rubs, or gallops.  Respiratory: Clear to auscultation bilaterally. GI: Soft, nontender, non-distended  MS: No edema  Assessment & Plan   Acute HFrEF: I think this is a good approximation of her dry weight at approximately 190 pounds on the hospital scale.  Seems to be clinically euvolemic.  Have added Entresto  and Jardiance and she may no longer need loop diuretics at discharge, but should have this available as needed.  Recommend daily weight monitoring and bringing a log to her office visit.  Also monitor her blood pressure once daily.  Discussed sodium restricted diet.  Also reviewed signs and symptoms  of heart failure exacerbation and when to call for advice or come to emergency room.  Bring back for an early follow-up visit in the [facility] clinic where we can try to either uptitrate the dose of Entresto  or add spironolactone, depending on her blood pressure and renal function parameters.  Recheck echocardiogram in 2-3 months to see if EF improves (consistent with tachycardia cardiomyopathy) or if it remains diminished (consider long-term sequelae of chemotherapy for non-Hodgkin's lymphoma, but will require evaluation for ischemic heart disease if EF remains low). AFib: No recurrence in the last 36 hours.  On metoprolol  for heart failure and rate control.  Appropriately anticoagulated with Eliquis . IVCD: Left bundle branch block still present even at slower rates in sinus rhythm, option for CRT in the future if EF deteriorates further.   [facility] will sign off.   The patient is ready for discharge today from a cardiac standpoint. Medication Recommendations:   Entresto  24/26 mg twice daily Eliquis  5 mg twice daily Metoprolol  tartrate 50 mg twice daily Dapagliflozin  10 mg once daily Furosemide  40 mg tablets, use only as needed for weight gain greater than 3 pounds in 24 hours or 5 pounds in a week.  Other recommendations (labs, testing, etc): Low-sodium diet, daily weight log and a daily blood pressure log (bring these to appointment).  Follow-up basic metabolic panel and BNP at next visit.  Follow-up echocardiogram in 2 months. Follow up as an outpatient: Will schedule a follow-up in our [facility] heart failure clinic in the next 2 weeks. For questions or updates, please contact [facility] Please consult [email or web address] for contact info under       Signed, Jerel Balding, MD

## 2023-12-26 NOTE — Plan of Care (Signed)
 ?  Problem: Clinical Measurements: ?Goal: Will remain free from infection ?Outcome: Progressing ?  ?Problem: Clinical Measurements: ?Goal: Diagnostic test results will improve ?Outcome: Progressing ?  ?

## 2023-12-27 ENCOUNTER — Telehealth (HOSPITAL_COMMUNITY): Payer: Self-pay

## 2023-12-27 NOTE — Telephone Encounter (Signed)
 Called to confirm/remind patient of their appointment at the [facility] on 12/30/23 8:15.   Appointment:   [] Confirmed  [x] Left mess   [] No answer/No voice mail  [] VM Full/unable to leave message  [] Phone not in service  Patient reminded to bring all medications and/or complete list.  Confirmed patient has transportation. Gave directions, instructed to utilize valet parking.

## 2023-12-27 NOTE — Telephone Encounter (Signed)
 Called to confirm/remind patient of their appointment at the [facility] on 12/30/2023.   Appointment:   [x] Confirmed  [] Left mess   [] No answer/No voice mail  [] VM Full/unable to leave message  [] Phone not in service  Patient reminded to bring all medications and/or complete list.  Confirmed patient has transportation. Gave directions, instructed to utilize valet parking.

## 2023-12-27 NOTE — Progress Notes (Signed)
 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  Admitted from: Home   Presentation:   Melissa Sosa presented with chest pain and shortness of breath, pain has increased since day before. Newly diagnosed with A-fib has been compliant with her metoprolol  and Eliquis . MD  increased her dose of metoprolol  these last few days without much improvement. BP 132/78, HR 118, BNP 1,036.5, EKG with Atrial Fibrillation with RVR. CXR showed volume overload and bilateral pleural effusions.   Called and spoke with patient on the phone, education completed on the sign and symptoms of heart failure, daily weights, when to  call her  doctor or go to the ED. Diet/ fluid restrictions, taking all medications as prescribed and attending all medical appointments. Patient verbalized her understanding . A  Living Better with Heart Failure  book will be given to patient at her HF TOC appointment on 12/30/2023 @ 8:15 am.   ECHO/ LVEF: 40-45%   Clinical Course:  Past Medical History:  Diagnosis Date   Anxiety    Breast cancer (HCC)    Hypertension    PMR (polymyalgia rheumatica) (HCC)    Pneumonia      Social History   Socioeconomic History   Marital status: Widowed    Spouse name: Not on file   Number of children: Not on file   Years of education: Not on file   Highest education level: Not on file  Occupational History   Not on file  Tobacco Use   Smoking status: Never   Smokeless tobacco: Never  Vaping Use   Vaping status: Never Used  Substance and Sexual Activity   Alcohol use: No   Drug use: Never   Sexual activity: Not on file  Other Topics Concern   Not on file  Social History Narrative   Not on file   Social Drivers of Health   Financial Resource Strain: Low Risk  (12/27/2023)   Overall Financial Resource Strain (CARDIA)     Difficulty of Paying Living Expenses: Not hard at all  Food Insecurity: No Food Insecurity (12/24/2023)   Hunger Vital Sign    Worried About Running Out of Food in the Last Year: Never true    Ran Out of Food in the Last Year: Never true  Transportation Needs: No Transportation Needs (12/24/2023)   PRAPARE - Administrator, Civil Service (Medical): No    Lack of Transportation (Non-Medical): No  Physical Activity: Not on file  Stress: Not on file  Social Connections: Patient Declined (12/24/2023)   Social Connection and Isolation Panel    Frequency of Communication with Friends and Family: Patient declined    Frequency of Social Gatherings with Friends and Family: Patient declined    Attends Religious Services: Patient declined    Database administrator or Organizations: Patient declined    Attends Engineer, structural: Patient declined    Marital Status: Patient declined   Education Assessment and Provision:  Detailed education and instructions provided on heart failure disease management including the following:  Signs and symptoms of Heart Failure When to call the physician Importance of daily weights Low sodium diet Fluid restriction Medication management Anticipated future follow-up appointments  Patient education given on each of the above topics.  Patient acknowledges understanding via teach back method and acceptance of all instructions.  Education Materials:  Living Better With Heart Failure  Booklet, HF zone tool, & Daily Weight Tracker Tool.  Patient has scale at home: Yes Patient has pill box at home: Yes    High Risk Criteria for Readmission and/or Poor Patient Outcomes: Heart failure hospital admissions (last 6 months): 1  No Show rate: 5 %  Difficult social situation: No  Demonstrates medication adherence: Yes Primary Language: English  Literacy level: Reading, writing, and comprehension   Barriers of Care:   Diet/ fluid  restrictions Daily weights  Considerations/Referrals:   Referral made to Heart Failure Pharmacist Stewardship: NA Referral made to Heart Failure CSW/NCM TOC: NA Referral made to Heart & Vascular TOC clinic: Yes, Per Dr. Francyne, on 12/30/2023 @ 8:15 am.   Items for Follow-up on DC/TOC: Continued HF education Diet/ fluid restrictions/ daily weights   Stephane Haddock, BSN, RN Heart Failure Teacher, adult education Only

## 2023-12-30 ENCOUNTER — Ambulatory Visit (HOSPITAL_COMMUNITY)
Admission: RE | Admit: 2023-12-30 | Discharge: 2023-12-30 | Disposition: A | Discharge status: Home / Self Care | Source: Ambulatory Visit | Attending: Physician Assistant | Admitting: Physician Assistant

## 2023-12-30 ENCOUNTER — Ambulatory Visit (HOSPITAL_COMMUNITY): Payer: Self-pay | Admitting: Physician Assistant

## 2023-12-30 ENCOUNTER — Other Ambulatory Visit (HOSPITAL_COMMUNITY): Payer: Self-pay

## 2023-12-30 ENCOUNTER — Encounter (HOSPITAL_COMMUNITY): Payer: Self-pay

## 2023-12-30 VITALS — BP 126/70 | HR 85 | Ht 66.0 in | Wt 191.4 lb

## 2023-12-30 DIAGNOSIS — M353 Polymyalgia rheumatica: Secondary | ICD-10-CM | POA: Insufficient documentation

## 2023-12-30 DIAGNOSIS — I447 Left bundle-branch block, unspecified: Secondary | ICD-10-CM | POA: Diagnosis not present

## 2023-12-30 DIAGNOSIS — E669 Obesity, unspecified: Secondary | ICD-10-CM | POA: Diagnosis not present

## 2023-12-30 DIAGNOSIS — I480 Paroxysmal atrial fibrillation: Secondary | ICD-10-CM | POA: Diagnosis not present

## 2023-12-30 DIAGNOSIS — Z7901 Long term (current) use of anticoagulants: Secondary | ICD-10-CM | POA: Insufficient documentation

## 2023-12-30 DIAGNOSIS — I110 Hypertensive heart disease with heart failure: Secondary | ICD-10-CM | POA: Diagnosis not present

## 2023-12-30 DIAGNOSIS — G40909 Epilepsy, unspecified, not intractable, without status epilepticus: Secondary | ICD-10-CM | POA: Insufficient documentation

## 2023-12-30 DIAGNOSIS — Z7984 Long term (current) use of oral hypoglycemic drugs: Secondary | ICD-10-CM | POA: Diagnosis not present

## 2023-12-30 DIAGNOSIS — Z79899 Other long term (current) drug therapy: Secondary | ICD-10-CM | POA: Diagnosis not present

## 2023-12-30 DIAGNOSIS — I5022 Chronic systolic (congestive) heart failure: Secondary | ICD-10-CM | POA: Insufficient documentation

## 2023-12-30 LAB — BASIC METABOLIC PANEL WITH GFR
Anion gap: 9 (ref 5–15)
BUN: 16 mg/dL (ref 8–23)
CO2: 26 mmol/L (ref 22–32)
Calcium: 9.6 mg/dL (ref 8.9–10.3)
Chloride: 105 mmol/L (ref 98–111)
Creatinine, Ser: 0.93 mg/dL (ref 0.44–1.00)
GFR, Estimated: >60 mL/min (ref >60)
Glucose, Bld: 119 mg/dL — ABNORMAL HIGH (ref 70–99)
Potassium: 3.8 mmol/L (ref 3.5–5.1)
Sodium: 140 mmol/L (ref 135–145)

## 2023-12-30 LAB — BRAIN NATRIURETIC PEPTIDE: B Natriuretic Peptide: 522.1 pg/mL — ABNORMAL HIGH (ref 0.0–100.0)

## 2023-12-30 MED ORDER — SACUBITRIL-VALSARTAN 24-26 MG PO TABS: 1.0000 | ORAL_TABLET | Freq: Two times a day (BID) | ORAL | 2 refills | Status: DC

## 2023-12-30 MED ORDER — APIXABAN 5 MG PO TABS: 5.0000 mg | ORAL_TABLET | Freq: Two times a day (BID) | ORAL | 2 refills | Status: DC

## 2023-12-30 MED ORDER — METOPROLOL SUCCINATE ER 50 MG PO TB24: 50.0000 mg | ORAL_TABLET | Freq: Every day | ORAL | 1 refills | Status: DC

## 2023-12-30 MED ORDER — DAPAGLIFLOZIN PROPANEDIOL 10 MG PO TABS: 10.0000 mg | ORAL_TABLET | Freq: Every day | ORAL | 2 refills | Status: DC

## 2023-12-30 MED ORDER — FUROSEMIDE 40 MG PO TABS: ORAL_TABLET | ORAL | 2 refills | Status: AC

## 2023-12-30 NOTE — Addendum Note (Signed)
 Encounter addended by: Colletta Manuelita Garre, PA-C on: 12/30/2023 10:05 AM  Actions taken: Clinical Note Signed

## 2023-12-30 NOTE — Patient Instructions (Signed)
 Medication Changes:  STOP METOPROLOL  TARTRATE   START METOPROLOL  SUCCINATE 50MG  ONCE DAILY   Lab Work:  Labs done today, your results will be available in MyChart, we will contact you for abnormal readings.  Follow-Up in: AS NEEDED WITH OUR CLINIC, PLEASE FOLLOW UP WITH GENERAL CARDIOLOGY DR. ALVAN OFFICE. REFERRAL PLACED.   At the [facility], you and your health needs are our priority. We have a designated team specialized in the treatment of Heart Failure. This Care Team includes your primary Heart Failure Specialized Cardiologist (physician), Advanced Practice Providers (APPs- Physician Assistants and Nurse Practitioners), and Pharmacist who all work together to provide you with the care you need, when you need it.   You may see any of the following providers on your designated Care Team at your next follow up:  Dr. Toribio Fuel Dr. Ezra Shuck Dr. Ria Commander Dr. Odis Brownie Greig Mosses, NP Caffie Shed, [place] [facility] [place], [place] Beckey Coe, NP [place] Lee, NP Tinnie Redman, PharmD   Please be sure to bring in all your medications bottles to every appointment.   Need to Contact Us :  If you have any questions or concerns before your next appointment please send us  a message through [place] or call our office at [phone].    TO LEAVE A MESSAGE FOR THE NURSE SELECT OPTION 2, PLEASE LEAVE A MESSAGE INCLUDING: YOUR NAME DATE OF BIRTH CALL BACK NUMBER REASON FOR CALL**this is important as we prioritize the call backs  YOU WILL RECEIVE A CALL BACK THE SAME DAY AS LONG AS YOU CALL BEFORE 4:00 PM

## 2023-12-30 NOTE — Progress Notes (Addendum)
 HEART & VASCULAR TRANSITION OF CARE CONSULT NOTE     Referring Physician: Dr. Kathrin PCP: Cristopher Bottcher, NP  Cardiologist: Dr. Kate  Chief Complaint: HFmrEF  HPI: Referred to clinic by Dr. Gonfa with [facility] for heart failure consultation.   Melissa Sosa is a 78 y.o. female with history of HFmrEF, paroxysmal atrial fibrillation, non-Hodgkin's lymphoma s/p unknown chemo in 1998 (reports that she received doxorubicin in regimen), uterine cancer s/p hysterectomy, b/l DCIS s/p lumpectomy, XRT and hormonal therapy, obesity, polymyalgia rheumatica, epilepsy.   She was hospitalized in 12/09/23 with atrial fibrillation with RVR and CAP. She converted to SR with diltiazem  gtt. Also found to have HFmrEF. Echo during admit with LVEF 40-45% and abnormal septal motion d/t IVCD.   She was readmitted on 12/24/23 with aucte on chronic CHF. She was back in AF w/ RVR and started on cardizem  gtt which was later stopped d/t reduced EF. She converted to SR spontaneously. Cardiology consulted. She was diuresed and GDMT titrated. 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 Plan was to consider ischemic workup if LV function remained reduced.  She is here today for hospital CHF follow-up.The patient has been doing well since discharge. Her weight has been 188-189 lb. Shortness of breath seems to be improving. Able to walk further without rest breaks. Having some fatigue which she thinks may be d/t recent hospitalizations. No orthopnea or PND, lower extremity edema resolved. She is wondering how much she should restrict fluid and sodium intake. Compliant with all medications. Her daughter assists her with organizing meds.   Past Medical History:  Diagnosis Date   Anxiety    Breast cancer (HCC)    Hypertension    PMR (polymyalgia rheumatica) (HCC)    Pneumonia     Current Outpatient Medications  Medication Sig Dispense Refill    acetaminophen  (TYLENOL ) 500 MG tablet Take 2 tablets (1,000 mg total) by mouth every 6 (six) hours as needed.     apixaban  (ELIQUIS ) 5 MG TABS tablet Take 1 tablet (5 mg total) by mouth 2 (two) times daily. 180 tablet 0   dapagliflozin  propanediol (FARXIGA ) 10 MG TABS tablet Take 1 tablet (10 mg total) by mouth daily. 90 tablet 0   furosemide  (LASIX ) 40 MG tablet Take one tablet  (40 mg) for weight gain greater than 3 pounds in 24 hours or 5 pounds in a week. 90 tablet 0   levETIRAcetam  (KEPPRA ) 750 MG tablet Take 1 tablet (750 mg total) by mouth 2 (two) times daily. 180 tablet 0   metoprolol  succinate (TOPROL -XL) 50 MG 24 hr tablet Take 1 tablet (50 mg total) by mouth daily. Take with or immediately following a meal. 90 tablet 1   Multiple Vitamin (MULTIVITAMIN) tablet Take 1 tablet by mouth daily.     nortriptyline  (PAMELOR ) 25 MG capsule Take 25 mg by mouth at bedtime.     sacubitril -valsartan  (ENTRESTO ) 24-26 MG Take 1 tablet by mouth 2 (two) times daily. 180 tablet 0   No current facility-administered medications for this encounter.    Allergies  Allergen Reactions   Sulfamethoxazole-Trimethoprim Hives and Rash   Venlafaxine Hives, Itching and Other (See Comments)    Headache   Imitrex [Sumatriptan]     hypertension   Morphine And Codeine Other (See Comments)    Hyper, confusion       Social History   Socioeconomic History   Marital status: Widowed    Spouse name: Not on  file   Number of children: Not on file   Years of education: Not on file   Highest education level: Not on file  Occupational History   Not on file  Tobacco Use   Smoking status: Never   Smokeless tobacco: Never  Vaping Use   Vaping status: Never Used  Substance and Sexual Activity   Alcohol use: No   Drug use: Never   Sexual activity: Not on file  Other Topics Concern   Not on file  Social History Narrative   Not on file   Social Drivers of Health   Financial Resource Strain: Low Risk   (12/27/2023)   Overall Financial Resource Strain (CARDIA)    Difficulty of Paying Living Expenses: Not hard at all  Food Insecurity: No Food Insecurity (12/24/2023)   Hunger Vital Sign    Worried About Running Out of Food in the Last Year: Never true    Ran Out of Food in the Last Year: Never true  Transportation Needs: No Transportation Needs (12/24/2023)   PRAPARE - Administrator, Civil Service (Medical): No    Lack of Transportation (Non-Medical): No  Physical Activity: Not on file  Stress: Not on file  Social Connections: Patient Declined (12/24/2023)   Social Connection and Isolation Panel    Frequency of Communication with Friends and Family: Patient declined    Frequency of Social Gatherings with Friends and Family: Patient declined    Attends Religious Services: Patient declined    Database administrator or Organizations: Patient declined    Attends Banker Meetings: Patient declined    Marital Status: Patient declined  Intimate Partner Violence: Not At Risk (12/24/2023)   Humiliation, Afraid, Rape, and Kick questionnaire    Fear of Current or Ex-Partner: No    Emotionally Abused: No    Physically Abused: No    Sexually Abused: No     No family history on file.  Vitals:   12/30/23 0812  BP: 126/70  Pulse: 85  SpO2: 98%  Weight: 86.8 kg (191 lb 6.4 oz)  Height: 5' 6 (1.676 m)    PHYSICAL EXAM: General:  Well appearing elderly female Neck: no JVD Cor: Regular rate & rhythm. No rubs, gallops or murmurs. Lungs: clear Abdomen: obese, soft, nondistended Extremities: no edema Neuro: alert & oriented x 3. Affect pleasant.  ECG: SR 74 bpm, 1st degree AVB, LBBB with QRS 136 ms   ASSESSMENT & PLAN: HFmrEF -Echo 04/25: EF 55-60%, RV okay -Echo 12/10/23: EF 40-45%, abnormal septal motion d/t IVCD, RV okay -Etiology not certain. Possibly tachymediated vs chemo-induced (received doxorubicin in past for non-Hodgkin's). Has LBBB which seems new from  04/25 but QRS not markedly wide (< 150 ms). Would consider ischemic workup with LHC vs coronary CTA. -NYHA II/early III.   -Volume looks good. Continue lasix  40 mg daily -Continue Entresto  24/26 mg BID -Switch metoprolol  tartrate to metoprolol  succinate 50 mg daily -Continue Farxiga  10 mg daily -Discussed addition of spironolactone today but she would prefer to hold off until next follow-up d/t multiple recent medication changes. -BMET/BNP today -Will need repeat echo in 2-3 months  PAF -Diagnosed in 07/25 -SR today. If she has recurrent atrial fibrillation, would refer to Afib clinic to review options for rhythm control -Beta blocker as above -Continue Eliquis  5 mg BID -Consider sleep study.   LBBB -QRS 136 ms on ECG today -May need to consider CRT in the future if QRS widens (>150 ms) and LV  function not improving  Sent three months worth of Metoprolol  succinate, Entresto , Eliquis  and Farxiga  to the pharmacy. Will need additional refills from Cardiology.   Referred to HFSW (PCP, Medications, Transportation, ETOH Abuse, Drug Abuse, Insurance, Financial ): No Refer to Pharmacy: No Refer to Home Health: No Refer to [facility] Clinic: No Refer to General Cardiology: Yes  Follow up  PRN. Referred for follow-up with Dr. Kate.

## 2024-01-01 DIAGNOSIS — I447 Left bundle-branch block, unspecified: Secondary | ICD-10-CM | POA: Diagnosis not present

## 2024-01-01 DIAGNOSIS — Z8541 Personal history of malignant neoplasm of cervix uteri: Secondary | ICD-10-CM | POA: Diagnosis not present

## 2024-01-01 DIAGNOSIS — Z5982 Transportation insecurity: Secondary | ICD-10-CM | POA: Diagnosis not present

## 2024-01-01 DIAGNOSIS — Z6830 Body mass index (BMI) 30.0-30.9, adult: Secondary | ICD-10-CM | POA: Diagnosis not present

## 2024-01-01 DIAGNOSIS — Z923 Personal history of irradiation: Secondary | ICD-10-CM | POA: Diagnosis not present

## 2024-01-01 DIAGNOSIS — F32A Depression, unspecified: Secondary | ICD-10-CM | POA: Diagnosis not present

## 2024-01-01 DIAGNOSIS — R569 Unspecified convulsions: Secondary | ICD-10-CM | POA: Diagnosis not present

## 2024-01-01 DIAGNOSIS — J479 Bronchiectasis, uncomplicated: Secondary | ICD-10-CM | POA: Diagnosis not present

## 2024-01-01 DIAGNOSIS — E669 Obesity, unspecified: Secondary | ICD-10-CM | POA: Diagnosis not present

## 2024-01-01 DIAGNOSIS — M353 Polymyalgia rheumatica: Secondary | ICD-10-CM | POA: Diagnosis not present

## 2024-01-01 DIAGNOSIS — F419 Anxiety disorder, unspecified: Secondary | ICD-10-CM | POA: Diagnosis not present

## 2024-01-01 DIAGNOSIS — Z90710 Acquired absence of both cervix and uterus: Secondary | ICD-10-CM | POA: Diagnosis not present

## 2024-01-01 DIAGNOSIS — Z7901 Long term (current) use of anticoagulants: Secondary | ICD-10-CM | POA: Diagnosis not present

## 2024-01-01 DIAGNOSIS — Z9221 Personal history of antineoplastic chemotherapy: Secondary | ICD-10-CM | POA: Diagnosis not present

## 2024-01-01 DIAGNOSIS — I5043 Acute on chronic combined systolic (congestive) and diastolic (congestive) heart failure: Secondary | ICD-10-CM | POA: Diagnosis not present

## 2024-01-01 DIAGNOSIS — Z853 Personal history of malignant neoplasm of breast: Secondary | ICD-10-CM | POA: Diagnosis not present

## 2024-01-01 DIAGNOSIS — G43909 Migraine, unspecified, not intractable, without status migrainosus: Secondary | ICD-10-CM | POA: Diagnosis not present

## 2024-01-01 DIAGNOSIS — I110 Hypertensive heart disease with heart failure: Secondary | ICD-10-CM | POA: Diagnosis not present

## 2024-01-01 DIAGNOSIS — Z8701 Personal history of pneumonia (recurrent): Secondary | ICD-10-CM | POA: Diagnosis not present

## 2024-01-01 DIAGNOSIS — I4891 Unspecified atrial fibrillation: Secondary | ICD-10-CM | POA: Diagnosis not present

## 2024-01-01 DIAGNOSIS — Z8572 Personal history of non-Hodgkin lymphomas: Secondary | ICD-10-CM | POA: Diagnosis not present

## 2024-01-01 DIAGNOSIS — M1990 Unspecified osteoarthritis, unspecified site: Secondary | ICD-10-CM | POA: Diagnosis not present

## 2024-01-01 DIAGNOSIS — D509 Iron deficiency anemia, unspecified: Secondary | ICD-10-CM | POA: Diagnosis not present

## 2024-01-03 ENCOUNTER — Other Ambulatory Visit (HOSPITAL_COMMUNITY): Payer: Self-pay

## 2024-01-06 DIAGNOSIS — I5020 Unspecified systolic (congestive) heart failure: Secondary | ICD-10-CM | POA: Diagnosis not present

## 2024-01-06 DIAGNOSIS — I4891 Unspecified atrial fibrillation: Secondary | ICD-10-CM | POA: Diagnosis not present

## 2024-01-06 DIAGNOSIS — N179 Acute kidney failure, unspecified: Secondary | ICD-10-CM | POA: Diagnosis not present

## 2024-01-06 DIAGNOSIS — R569 Unspecified convulsions: Secondary | ICD-10-CM | POA: Diagnosis not present

## 2024-01-08 DIAGNOSIS — Z5982 Transportation insecurity: Secondary | ICD-10-CM | POA: Diagnosis not present

## 2024-01-08 DIAGNOSIS — I5043 Acute on chronic combined systolic (congestive) and diastolic (congestive) heart failure: Secondary | ICD-10-CM | POA: Diagnosis not present

## 2024-01-08 DIAGNOSIS — M353 Polymyalgia rheumatica: Secondary | ICD-10-CM | POA: Diagnosis not present

## 2024-01-08 DIAGNOSIS — Z8572 Personal history of non-Hodgkin lymphomas: Secondary | ICD-10-CM | POA: Diagnosis not present

## 2024-01-08 DIAGNOSIS — G43909 Migraine, unspecified, not intractable, without status migrainosus: Secondary | ICD-10-CM | POA: Diagnosis not present

## 2024-01-08 DIAGNOSIS — I110 Hypertensive heart disease with heart failure: Secondary | ICD-10-CM | POA: Diagnosis not present

## 2024-01-08 DIAGNOSIS — Z90710 Acquired absence of both cervix and uterus: Secondary | ICD-10-CM | POA: Diagnosis not present

## 2024-01-08 DIAGNOSIS — Z9221 Personal history of antineoplastic chemotherapy: Secondary | ICD-10-CM | POA: Diagnosis not present

## 2024-01-08 DIAGNOSIS — J479 Bronchiectasis, uncomplicated: Secondary | ICD-10-CM | POA: Diagnosis not present

## 2024-01-08 DIAGNOSIS — E669 Obesity, unspecified: Secondary | ICD-10-CM | POA: Diagnosis not present

## 2024-01-08 DIAGNOSIS — D509 Iron deficiency anemia, unspecified: Secondary | ICD-10-CM | POA: Diagnosis not present

## 2024-01-08 DIAGNOSIS — Z923 Personal history of irradiation: Secondary | ICD-10-CM | POA: Diagnosis not present

## 2024-01-08 DIAGNOSIS — R569 Unspecified convulsions: Secondary | ICD-10-CM | POA: Diagnosis not present

## 2024-01-08 DIAGNOSIS — Z8541 Personal history of malignant neoplasm of cervix uteri: Secondary | ICD-10-CM | POA: Diagnosis not present

## 2024-01-08 DIAGNOSIS — Z7901 Long term (current) use of anticoagulants: Secondary | ICD-10-CM | POA: Diagnosis not present

## 2024-01-08 DIAGNOSIS — F32A Depression, unspecified: Secondary | ICD-10-CM | POA: Diagnosis not present

## 2024-01-08 DIAGNOSIS — Z8701 Personal history of pneumonia (recurrent): Secondary | ICD-10-CM | POA: Diagnosis not present

## 2024-01-08 DIAGNOSIS — I447 Left bundle-branch block, unspecified: Secondary | ICD-10-CM | POA: Diagnosis not present

## 2024-01-08 DIAGNOSIS — F419 Anxiety disorder, unspecified: Secondary | ICD-10-CM | POA: Diagnosis not present

## 2024-01-08 DIAGNOSIS — Z853 Personal history of malignant neoplasm of breast: Secondary | ICD-10-CM | POA: Diagnosis not present

## 2024-01-08 DIAGNOSIS — M1990 Unspecified osteoarthritis, unspecified site: Secondary | ICD-10-CM | POA: Diagnosis not present

## 2024-01-08 DIAGNOSIS — I4891 Unspecified atrial fibrillation: Secondary | ICD-10-CM | POA: Diagnosis not present

## 2024-01-08 DIAGNOSIS — Z6830 Body mass index (BMI) 30.0-30.9, adult: Secondary | ICD-10-CM | POA: Diagnosis not present

## 2024-01-14 ENCOUNTER — Encounter: Payer: Self-pay | Admitting: Neurology

## 2024-01-15 ENCOUNTER — Other Ambulatory Visit (HOSPITAL_COMMUNITY): Payer: Self-pay

## 2024-01-15 MED ORDER — APIXABAN 5 MG PO TABS: 5.0000 mg | ORAL_TABLET | Freq: Two times a day (BID) | ORAL | 0 refills | Status: DC

## 2024-01-15 MED ORDER — DAPAGLIFLOZIN PROPANEDIOL 10 MG PO TABS: 10.0000 mg | ORAL_TABLET | Freq: Every day | ORAL | 0 refills | Status: DC

## 2024-01-16 ENCOUNTER — Other Ambulatory Visit (HOSPITAL_COMMUNITY): Payer: Self-pay | Admitting: Cardiology

## 2024-01-16 DIAGNOSIS — D509 Iron deficiency anemia, unspecified: Secondary | ICD-10-CM | POA: Diagnosis not present

## 2024-01-16 DIAGNOSIS — M353 Polymyalgia rheumatica: Secondary | ICD-10-CM | POA: Diagnosis not present

## 2024-01-16 DIAGNOSIS — Z8701 Personal history of pneumonia (recurrent): Secondary | ICD-10-CM | POA: Diagnosis not present

## 2024-01-16 DIAGNOSIS — I5043 Acute on chronic combined systolic (congestive) and diastolic (congestive) heart failure: Secondary | ICD-10-CM | POA: Diagnosis not present

## 2024-01-16 DIAGNOSIS — I447 Left bundle-branch block, unspecified: Secondary | ICD-10-CM | POA: Diagnosis not present

## 2024-01-16 DIAGNOSIS — Z90710 Acquired absence of both cervix and uterus: Secondary | ICD-10-CM | POA: Diagnosis not present

## 2024-01-16 DIAGNOSIS — G43909 Migraine, unspecified, not intractable, without status migrainosus: Secondary | ICD-10-CM | POA: Diagnosis not present

## 2024-01-16 DIAGNOSIS — J479 Bronchiectasis, uncomplicated: Secondary | ICD-10-CM | POA: Diagnosis not present

## 2024-01-16 DIAGNOSIS — M1990 Unspecified osteoarthritis, unspecified site: Secondary | ICD-10-CM | POA: Diagnosis not present

## 2024-01-16 DIAGNOSIS — I110 Hypertensive heart disease with heart failure: Secondary | ICD-10-CM | POA: Diagnosis not present

## 2024-01-16 DIAGNOSIS — Z6830 Body mass index (BMI) 30.0-30.9, adult: Secondary | ICD-10-CM | POA: Diagnosis not present

## 2024-01-16 DIAGNOSIS — Z853 Personal history of malignant neoplasm of breast: Secondary | ICD-10-CM | POA: Diagnosis not present

## 2024-01-16 DIAGNOSIS — Z7901 Long term (current) use of anticoagulants: Secondary | ICD-10-CM | POA: Diagnosis not present

## 2024-01-16 DIAGNOSIS — R569 Unspecified convulsions: Secondary | ICD-10-CM | POA: Diagnosis not present

## 2024-01-16 DIAGNOSIS — Z8541 Personal history of malignant neoplasm of cervix uteri: Secondary | ICD-10-CM | POA: Diagnosis not present

## 2024-01-16 DIAGNOSIS — Z8572 Personal history of non-Hodgkin lymphomas: Secondary | ICD-10-CM | POA: Diagnosis not present

## 2024-01-16 DIAGNOSIS — F419 Anxiety disorder, unspecified: Secondary | ICD-10-CM | POA: Diagnosis not present

## 2024-01-16 DIAGNOSIS — I4891 Unspecified atrial fibrillation: Secondary | ICD-10-CM | POA: Diagnosis not present

## 2024-01-16 DIAGNOSIS — E669 Obesity, unspecified: Secondary | ICD-10-CM | POA: Diagnosis not present

## 2024-01-16 DIAGNOSIS — Z5982 Transportation insecurity: Secondary | ICD-10-CM | POA: Diagnosis not present

## 2024-01-16 DIAGNOSIS — Z923 Personal history of irradiation: Secondary | ICD-10-CM | POA: Diagnosis not present

## 2024-01-16 DIAGNOSIS — F32A Depression, unspecified: Secondary | ICD-10-CM | POA: Diagnosis not present

## 2024-01-16 DIAGNOSIS — Z9221 Personal history of antineoplastic chemotherapy: Secondary | ICD-10-CM | POA: Diagnosis not present

## 2024-01-16 MED ORDER — SACUBITRIL-VALSARTAN 24-26 MG PO TABS: 1.0000 | ORAL_TABLET | Freq: Two times a day (BID) | ORAL | 2 refills | Status: DC

## 2024-01-17 DIAGNOSIS — I5020 Unspecified systolic (congestive) heart failure: Secondary | ICD-10-CM | POA: Diagnosis not present

## 2024-01-17 DIAGNOSIS — R202 Paresthesia of skin: Secondary | ICD-10-CM | POA: Diagnosis not present

## 2024-01-17 DIAGNOSIS — I480 Paroxysmal atrial fibrillation: Secondary | ICD-10-CM | POA: Diagnosis not present

## 2024-01-17 DIAGNOSIS — I959 Hypotension, unspecified: Secondary | ICD-10-CM | POA: Diagnosis not present

## 2024-01-22 ENCOUNTER — Telehealth (HOSPITAL_COMMUNITY): Payer: Self-pay | Admitting: Cardiology

## 2024-01-22 DIAGNOSIS — F419 Anxiety disorder, unspecified: Secondary | ICD-10-CM | POA: Diagnosis not present

## 2024-01-22 DIAGNOSIS — I4891 Unspecified atrial fibrillation: Secondary | ICD-10-CM | POA: Diagnosis not present

## 2024-01-22 DIAGNOSIS — Z5982 Transportation insecurity: Secondary | ICD-10-CM | POA: Diagnosis not present

## 2024-01-22 DIAGNOSIS — Z8572 Personal history of non-Hodgkin lymphomas: Secondary | ICD-10-CM | POA: Diagnosis not present

## 2024-01-22 DIAGNOSIS — Z8541 Personal history of malignant neoplasm of cervix uteri: Secondary | ICD-10-CM | POA: Diagnosis not present

## 2024-01-22 DIAGNOSIS — R569 Unspecified convulsions: Secondary | ICD-10-CM | POA: Diagnosis not present

## 2024-01-22 DIAGNOSIS — G43909 Migraine, unspecified, not intractable, without status migrainosus: Secondary | ICD-10-CM | POA: Diagnosis not present

## 2024-01-22 DIAGNOSIS — Z90710 Acquired absence of both cervix and uterus: Secondary | ICD-10-CM | POA: Diagnosis not present

## 2024-01-22 DIAGNOSIS — I447 Left bundle-branch block, unspecified: Secondary | ICD-10-CM | POA: Diagnosis not present

## 2024-01-22 DIAGNOSIS — D509 Iron deficiency anemia, unspecified: Secondary | ICD-10-CM | POA: Diagnosis not present

## 2024-01-22 DIAGNOSIS — Z6830 Body mass index (BMI) 30.0-30.9, adult: Secondary | ICD-10-CM | POA: Diagnosis not present

## 2024-01-22 DIAGNOSIS — M1990 Unspecified osteoarthritis, unspecified site: Secondary | ICD-10-CM | POA: Diagnosis not present

## 2024-01-22 DIAGNOSIS — Z923 Personal history of irradiation: Secondary | ICD-10-CM | POA: Diagnosis not present

## 2024-01-22 DIAGNOSIS — F32A Depression, unspecified: Secondary | ICD-10-CM | POA: Diagnosis not present

## 2024-01-22 DIAGNOSIS — J479 Bronchiectasis, uncomplicated: Secondary | ICD-10-CM | POA: Diagnosis not present

## 2024-01-22 DIAGNOSIS — Z9221 Personal history of antineoplastic chemotherapy: Secondary | ICD-10-CM | POA: Diagnosis not present

## 2024-01-22 DIAGNOSIS — E669 Obesity, unspecified: Secondary | ICD-10-CM | POA: Diagnosis not present

## 2024-01-22 DIAGNOSIS — I5043 Acute on chronic combined systolic (congestive) and diastolic (congestive) heart failure: Secondary | ICD-10-CM | POA: Diagnosis not present

## 2024-01-22 DIAGNOSIS — Z853 Personal history of malignant neoplasm of breast: Secondary | ICD-10-CM | POA: Diagnosis not present

## 2024-01-22 DIAGNOSIS — Z7901 Long term (current) use of anticoagulants: Secondary | ICD-10-CM | POA: Diagnosis not present

## 2024-01-22 DIAGNOSIS — M353 Polymyalgia rheumatica: Secondary | ICD-10-CM | POA: Diagnosis not present

## 2024-01-22 DIAGNOSIS — Z8701 Personal history of pneumonia (recurrent): Secondary | ICD-10-CM | POA: Diagnosis not present

## 2024-01-22 DIAGNOSIS — I110 Hypertensive heart disease with heart failure: Secondary | ICD-10-CM | POA: Diagnosis not present

## 2024-01-22 NOTE — Telephone Encounter (Signed)
 Seen by PCP last week, during visit b/p was super lo (90/58) . Pt felt she was dehydrated. PCP Entresto  held x4 days, if SBP below 110   Only reading x 1 with SBP lower than 110, pt has since restarted.  Reports she feels better with increase in fluids x 1 day Has restarted entresto  Home b/p readings 110-120 Asymptomatic Felt she needed to notify office as fyi  Advised to call if needed, continue to monitor b/p daily, notify if SBP less than 90

## 2024-01-27 DIAGNOSIS — Z5982 Transportation insecurity: Secondary | ICD-10-CM | POA: Diagnosis not present

## 2024-01-27 DIAGNOSIS — Z8701 Personal history of pneumonia (recurrent): Secondary | ICD-10-CM | POA: Diagnosis not present

## 2024-01-27 DIAGNOSIS — Z9221 Personal history of antineoplastic chemotherapy: Secondary | ICD-10-CM | POA: Diagnosis not present

## 2024-01-27 DIAGNOSIS — Z90710 Acquired absence of both cervix and uterus: Secondary | ICD-10-CM | POA: Diagnosis not present

## 2024-01-27 DIAGNOSIS — Z8572 Personal history of non-Hodgkin lymphomas: Secondary | ICD-10-CM | POA: Diagnosis not present

## 2024-01-27 DIAGNOSIS — M353 Polymyalgia rheumatica: Secondary | ICD-10-CM | POA: Diagnosis not present

## 2024-01-27 DIAGNOSIS — I5043 Acute on chronic combined systolic (congestive) and diastolic (congestive) heart failure: Secondary | ICD-10-CM | POA: Diagnosis not present

## 2024-01-27 DIAGNOSIS — Z7901 Long term (current) use of anticoagulants: Secondary | ICD-10-CM | POA: Diagnosis not present

## 2024-01-27 DIAGNOSIS — I110 Hypertensive heart disease with heart failure: Secondary | ICD-10-CM | POA: Diagnosis not present

## 2024-01-27 DIAGNOSIS — I4891 Unspecified atrial fibrillation: Secondary | ICD-10-CM | POA: Diagnosis not present

## 2024-01-27 DIAGNOSIS — Z8541 Personal history of malignant neoplasm of cervix uteri: Secondary | ICD-10-CM | POA: Diagnosis not present

## 2024-01-27 DIAGNOSIS — E669 Obesity, unspecified: Secondary | ICD-10-CM | POA: Diagnosis not present

## 2024-01-27 DIAGNOSIS — Z853 Personal history of malignant neoplasm of breast: Secondary | ICD-10-CM | POA: Diagnosis not present

## 2024-01-27 DIAGNOSIS — F419 Anxiety disorder, unspecified: Secondary | ICD-10-CM | POA: Diagnosis not present

## 2024-01-27 DIAGNOSIS — G43909 Migraine, unspecified, not intractable, without status migrainosus: Secondary | ICD-10-CM | POA: Diagnosis not present

## 2024-01-27 DIAGNOSIS — I447 Left bundle-branch block, unspecified: Secondary | ICD-10-CM | POA: Diagnosis not present

## 2024-01-27 DIAGNOSIS — M1990 Unspecified osteoarthritis, unspecified site: Secondary | ICD-10-CM | POA: Diagnosis not present

## 2024-01-27 DIAGNOSIS — R569 Unspecified convulsions: Secondary | ICD-10-CM | POA: Diagnosis not present

## 2024-01-27 DIAGNOSIS — Z923 Personal history of irradiation: Secondary | ICD-10-CM | POA: Diagnosis not present

## 2024-01-27 DIAGNOSIS — J479 Bronchiectasis, uncomplicated: Secondary | ICD-10-CM | POA: Diagnosis not present

## 2024-01-27 DIAGNOSIS — F32A Depression, unspecified: Secondary | ICD-10-CM | POA: Diagnosis not present

## 2024-01-27 DIAGNOSIS — Z6830 Body mass index (BMI) 30.0-30.9, adult: Secondary | ICD-10-CM | POA: Diagnosis not present

## 2024-01-27 DIAGNOSIS — D509 Iron deficiency anemia, unspecified: Secondary | ICD-10-CM | POA: Diagnosis not present

## 2024-01-29 ENCOUNTER — Ambulatory Visit: Admitting: Emergency Medicine

## 2024-01-29 DIAGNOSIS — Z7901 Long term (current) use of anticoagulants: Secondary | ICD-10-CM | POA: Diagnosis not present

## 2024-01-29 DIAGNOSIS — R569 Unspecified convulsions: Secondary | ICD-10-CM | POA: Diagnosis not present

## 2024-01-29 DIAGNOSIS — I4891 Unspecified atrial fibrillation: Secondary | ICD-10-CM | POA: Diagnosis not present

## 2024-01-29 DIAGNOSIS — Z6830 Body mass index (BMI) 30.0-30.9, adult: Secondary | ICD-10-CM | POA: Diagnosis not present

## 2024-01-29 DIAGNOSIS — M1990 Unspecified osteoarthritis, unspecified site: Secondary | ICD-10-CM | POA: Diagnosis not present

## 2024-01-29 DIAGNOSIS — D509 Iron deficiency anemia, unspecified: Secondary | ICD-10-CM | POA: Diagnosis not present

## 2024-01-29 DIAGNOSIS — Z8572 Personal history of non-Hodgkin lymphomas: Secondary | ICD-10-CM | POA: Diagnosis not present

## 2024-01-29 DIAGNOSIS — F419 Anxiety disorder, unspecified: Secondary | ICD-10-CM | POA: Diagnosis not present

## 2024-01-29 DIAGNOSIS — I447 Left bundle-branch block, unspecified: Secondary | ICD-10-CM | POA: Diagnosis not present

## 2024-01-29 DIAGNOSIS — Z923 Personal history of irradiation: Secondary | ICD-10-CM | POA: Diagnosis not present

## 2024-01-29 DIAGNOSIS — Z5982 Transportation insecurity: Secondary | ICD-10-CM | POA: Diagnosis not present

## 2024-01-29 DIAGNOSIS — Z8701 Personal history of pneumonia (recurrent): Secondary | ICD-10-CM | POA: Diagnosis not present

## 2024-01-29 DIAGNOSIS — Z853 Personal history of malignant neoplasm of breast: Secondary | ICD-10-CM | POA: Diagnosis not present

## 2024-01-29 DIAGNOSIS — Z8541 Personal history of malignant neoplasm of cervix uteri: Secondary | ICD-10-CM | POA: Diagnosis not present

## 2024-01-29 DIAGNOSIS — J479 Bronchiectasis, uncomplicated: Secondary | ICD-10-CM | POA: Diagnosis not present

## 2024-01-29 DIAGNOSIS — I5043 Acute on chronic combined systolic (congestive) and diastolic (congestive) heart failure: Secondary | ICD-10-CM | POA: Diagnosis not present

## 2024-01-29 DIAGNOSIS — F32A Depression, unspecified: Secondary | ICD-10-CM | POA: Diagnosis not present

## 2024-01-29 DIAGNOSIS — Z9221 Personal history of antineoplastic chemotherapy: Secondary | ICD-10-CM | POA: Diagnosis not present

## 2024-01-29 DIAGNOSIS — E669 Obesity, unspecified: Secondary | ICD-10-CM | POA: Diagnosis not present

## 2024-01-29 DIAGNOSIS — G43909 Migraine, unspecified, not intractable, without status migrainosus: Secondary | ICD-10-CM | POA: Diagnosis not present

## 2024-01-29 DIAGNOSIS — M353 Polymyalgia rheumatica: Secondary | ICD-10-CM | POA: Diagnosis not present

## 2024-01-29 DIAGNOSIS — Z90710 Acquired absence of both cervix and uterus: Secondary | ICD-10-CM | POA: Diagnosis not present

## 2024-01-29 DIAGNOSIS — I110 Hypertensive heart disease with heart failure: Secondary | ICD-10-CM | POA: Diagnosis not present

## 2024-01-30 DIAGNOSIS — Z23 Encounter for immunization: Secondary | ICD-10-CM | POA: Diagnosis not present

## 2024-01-31 ENCOUNTER — Other Ambulatory Visit: Payer: Self-pay

## 2024-01-31 ENCOUNTER — Emergency Department (HOSPITAL_COMMUNITY)

## 2024-01-31 ENCOUNTER — Observation Stay (HOSPITAL_COMMUNITY)
Admission: EM | Admit: 2024-01-31 | Discharge: 2024-02-01 | Disposition: A | Discharge status: Home / Self Care | Attending: Internal Medicine | Admitting: Internal Medicine

## 2024-01-31 DIAGNOSIS — Z7982 Long term (current) use of aspirin: Secondary | ICD-10-CM | POA: Diagnosis not present

## 2024-01-31 DIAGNOSIS — F32A Depression, unspecified: Secondary | ICD-10-CM | POA: Insufficient documentation

## 2024-01-31 DIAGNOSIS — G40909 Epilepsy, unspecified, not intractable, without status epilepticus: Secondary | ICD-10-CM | POA: Insufficient documentation

## 2024-01-31 DIAGNOSIS — F419 Anxiety disorder, unspecified: Secondary | ICD-10-CM | POA: Diagnosis not present

## 2024-01-31 DIAGNOSIS — I639 Cerebral infarction, unspecified: Secondary | ICD-10-CM | POA: Diagnosis not present

## 2024-01-31 DIAGNOSIS — Z8572 Personal history of non-Hodgkin lymphomas: Secondary | ICD-10-CM | POA: Insufficient documentation

## 2024-01-31 DIAGNOSIS — Z8541 Personal history of malignant neoplasm of cervix uteri: Secondary | ICD-10-CM | POA: Insufficient documentation

## 2024-01-31 DIAGNOSIS — I6381 Other cerebral infarction due to occlusion or stenosis of small artery: Secondary | ICD-10-CM

## 2024-01-31 DIAGNOSIS — I739 Peripheral vascular disease, unspecified: Secondary | ICD-10-CM

## 2024-01-31 DIAGNOSIS — I6389 Other cerebral infarction: Principal | ICD-10-CM | POA: Insufficient documentation

## 2024-01-31 DIAGNOSIS — E66811 Obesity, class 1: Secondary | ICD-10-CM | POA: Insufficient documentation

## 2024-01-31 DIAGNOSIS — Z853 Personal history of malignant neoplasm of breast: Secondary | ICD-10-CM | POA: Diagnosis not present

## 2024-01-31 DIAGNOSIS — R202 Paresthesia of skin: Secondary | ICD-10-CM | POA: Diagnosis not present

## 2024-01-31 DIAGNOSIS — I5022 Chronic systolic (congestive) heart failure: Secondary | ICD-10-CM | POA: Diagnosis not present

## 2024-01-31 DIAGNOSIS — I110 Hypertensive heart disease with heart failure: Secondary | ICD-10-CM | POA: Insufficient documentation

## 2024-01-31 DIAGNOSIS — I6782 Cerebral ischemia: Secondary | ICD-10-CM | POA: Diagnosis not present

## 2024-01-31 DIAGNOSIS — I480 Paroxysmal atrial fibrillation: Secondary | ICD-10-CM | POA: Insufficient documentation

## 2024-01-31 DIAGNOSIS — R42 Dizziness and giddiness: Secondary | ICD-10-CM | POA: Diagnosis not present

## 2024-01-31 DIAGNOSIS — Z6830 Body mass index (BMI) 30.0-30.9, adult: Secondary | ICD-10-CM | POA: Insufficient documentation

## 2024-01-31 DIAGNOSIS — I10 Essential (primary) hypertension: Secondary | ICD-10-CM

## 2024-01-31 DIAGNOSIS — Z79899 Other long term (current) drug therapy: Secondary | ICD-10-CM | POA: Insufficient documentation

## 2024-01-31 DIAGNOSIS — Z7901 Long term (current) use of anticoagulants: Secondary | ICD-10-CM | POA: Insufficient documentation

## 2024-01-31 DIAGNOSIS — I447 Left bundle-branch block, unspecified: Secondary | ICD-10-CM | POA: Diagnosis not present

## 2024-01-31 LAB — BASIC METABOLIC PANEL WITH GFR
Anion gap: 10 (ref 5–15)
BUN: 15 mg/dL (ref 8–23)
CO2: 23 mmol/L (ref 22–32)
Calcium: 9.7 mg/dL (ref 8.9–10.3)
Chloride: 106 mmol/L (ref 98–111)
Creatinine, Ser: 0.89 mg/dL (ref 0.44–1.00)
GFR, Estimated: >60 mL/min (ref >60)
Glucose, Bld: 116 mg/dL — ABNORMAL HIGH (ref 70–99)
Potassium: 4.3 mmol/L (ref 3.5–5.1)
Sodium: 139 mmol/L (ref 135–145)

## 2024-01-31 LAB — CBC WITH DIFFERENTIAL/PLATELET
Abs Immature Granulocytes: 0.01 K/uL (ref 0.00–0.07)
Basophils Absolute: 0.1 K/uL (ref 0.0–0.1)
Basophils Relative: 1 %
Eosinophils Absolute: 0.1 K/uL (ref 0.0–0.5)
Eosinophils Relative: 3 %
HCT: 49.4 % — ABNORMAL HIGH (ref 36.0–46.0)
Hemoglobin: 15.7 g/dL — ABNORMAL HIGH (ref 12.0–15.0)
Immature Granulocytes: 0 %
Lymphocytes Relative: 28 %
Lymphs Abs: 1.6 K/uL (ref 0.7–4.0)
MCH: 28.4 pg (ref 26.0–34.0)
MCHC: 31.8 g/dL (ref 30.0–36.0)
MCV: 89.5 fL (ref 80.0–100.0)
Monocytes Absolute: 0.6 K/uL (ref 0.1–1.0)
Monocytes Relative: 11 %
Neutro Abs: 3.1 K/uL (ref 1.7–7.7)
Neutrophils Relative %: 57 %
Platelets: 226 K/uL (ref 150–400)
RBC: 5.52 MIL/uL — ABNORMAL HIGH (ref 3.87–5.11)
RDW: 13.1 % (ref 11.5–15.5)
WBC: 5.5 K/uL (ref 4.0–10.5)
nRBC: 0 % (ref 0.0–0.2)

## 2024-01-31 LAB — URINALYSIS, ROUTINE W REFLEX MICROSCOPIC
Bacteria, UA: NONE SEEN
Bilirubin Urine: NEGATIVE
Glucose, UA: >=500 mg/dL — AB
Hgb urine dipstick: NEGATIVE
Ketones, ur: NEGATIVE mg/dL
Leukocytes,Ua: NEGATIVE
Nitrite: NEGATIVE
Protein, ur: NEGATIVE mg/dL
Specific Gravity, Urine: 1.01 (ref 1.005–1.030)
pH: 6 (ref 5.0–8.0)

## 2024-01-31 LAB — I-STAT CHEM 8, ED
BUN: 17 mg/dL (ref 8–23)
Calcium, Ion: 1.22 mmol/L (ref 1.15–1.40)
Chloride: 107 mmol/L (ref 98–111)
Creatinine, Ser: 0.9 mg/dL (ref 0.44–1.00)
Glucose, Bld: 122 mg/dL — ABNORMAL HIGH (ref 70–99)
HCT: 48 % — ABNORMAL HIGH (ref 36.0–46.0)
Hemoglobin: 16.3 g/dL — ABNORMAL HIGH (ref 12.0–15.0)
Potassium: 4.4 mmol/L (ref 3.5–5.1)
Sodium: 141 mmol/L (ref 135–145)
TCO2: 25 mmol/L (ref 22–32)

## 2024-01-31 LAB — MAGNESIUM: Magnesium: 2 mg/dL (ref 1.7–2.4)

## 2024-01-31 LAB — TROPONIN I (HIGH SENSITIVITY)
Troponin I (High Sensitivity): 10 ng/L (ref <18)
Troponin I (High Sensitivity): 10 ng/L (ref <18)

## 2024-01-31 MED ORDER — LORAZEPAM 2 MG/ML IJ SOLN
1.0000 mg | Freq: Once | INTRAMUSCULAR | Status: AC
  Administered 2024-01-31: 1 mg via INTRAVENOUS
  Filled 2024-01-31: qty 1

## 2024-01-31 MED ORDER — LEVETIRACETAM 500 MG PO TABS
750.0000 mg | ORAL_TABLET | Freq: Once | ORAL | Status: AC
  Administered 2024-01-31: 750 mg via ORAL
  Filled 2024-01-31: qty 1

## 2024-01-31 NOTE — ED Notes (Signed)
 Patient transported to MRI

## 2024-01-31 NOTE — ED Notes (Signed)
 Patient returned from MRI.

## 2024-01-31 NOTE — Consult Note (Signed)
 NEUROLOGY CONSULT NOTE   Date of service: January 31, 2024 Patient Name: Melissa Sosa MRN:  [id number] DOB:  [birth year] Chief Complaint: Left face and hand numbness Requesting Provider: Gennaro Duwaine CROME, DO  History of Present Illness  Melissa Sosa is a 78 y.o. female with a PMHx of atrial fibrillation (on Eliquis ), anxiety, breast cancer s/p lumpectomy and radiation therapy, cervical cancer status post hysterectomy, non-Hodgkin's lymphoma status post chemotherapy, HFmrEF, HTN, seizure disorder, polymyalgia rheumatica who presents to the ED with left sided face, forehead and jaw numbness, as well as tingling to the fingers of her left hand. She had a similar occurrence about one month ago that had resolved with EMS at that time and she declined transport to the hospital. She did have a flu shot on Thursday. Vitals per EMS: Afib w LBBB; 124/70 BP; 100% O2 on RA; 130 BG; 90 HR.   She also has a history of one focal seizure in the past which manifested with some trouble writing. She is on Keppra  and states has been compliant. She has no prior history of stroke.  CT head showed no acute intracranial abnormality. Brain MRI revealed a 6 mm acute nonhemorrhagic infarct in the lateral right thalamus with subtle T2 signal changes.    ROS  Comprehensive ROS performed and pertinent positives documented in HPI   Past History   Past Medical History:  Diagnosis Date   Anxiety    Breast cancer (HCC)    Hypertension    PMR (polymyalgia rheumatica) (HCC)    Pneumonia     No past surgical history on file.  Family History: No family history on file.  Social History  reports that she has never smoked. She has never used smokeless tobacco. She reports that she does not drink alcohol and does not use drugs.  Allergies  Allergen Reactions   Sulfamethoxazole-Trimethoprim Hives and Rash   Venlafaxine Hives, Itching and Other (See Comments)    Headache   Imitrex [Sumatriptan]     hypertension    Morphine And Codeine Other (See Comments)    Hyper, confusion     Medications  No current facility-administered medications for this encounter.  Current Outpatient Medications:    acetaminophen  (TYLENOL ) 500 MG tablet, Take 2 tablets (1,000 mg total) by mouth every 6 (six) hours as needed., Disp: , Rfl:    apixaban  (ELIQUIS ) 5 MG TABS tablet, Take 1 tablet (5 mg total) by mouth 2 (two) times daily., Disp: 180 tablet, Rfl: 0   dapagliflozin  propanediol (FARXIGA ) 10 MG TABS tablet, Take 1 tablet (10 mg total) by mouth daily., Disp: 90 tablet, Rfl: 0   furosemide  (LASIX ) 40 MG tablet, Take one tablet  (40 mg) for weight gain greater than 3 pounds in 24 hours or 5 pounds in a week., Disp: 30 tablet, Rfl: 2   levETIRAcetam  (KEPPRA ) 750 MG tablet, Take 1 tablet (750 mg total) by mouth 2 (two) times daily., Disp: 180 tablet, Rfl: 0   metoprolol  succinate (TOPROL -XL) 50 MG 24 hr tablet, Take 1 tablet (50 mg total) by mouth daily. Take with or immediately following a meal., Disp: 90 tablet, Rfl: 1   Multiple Vitamin (MULTIVITAMIN) tablet, Take 1 tablet by mouth daily., Disp: , Rfl:    nortriptyline  (PAMELOR ) 25 MG capsule, Take 25 mg by mouth at bedtime., Disp: , Rfl:    sacubitril -valsartan  (ENTRESTO ) 24-26 MG, Take 1 tablet by mouth 2 (two) times daily., Disp: 180 tablet, Rfl: 2  Vitals   Vitals:   01/31/24  1651 02/27/2024 1701  BP:  134/66  Pulse:  82  Resp:  17  Temp:  98.2 F (36.8 C)  TempSrc:  Oral  SpO2:  100%  Weight: 85.7 kg   Height: 5' 6 (1.676 m)     Body mass index is 30.51 kg/m.   Physical Exam   Constitutional: Appears well-developed and well-nourished.  Psych: Affect appropriate to situation.  Eyes: No scleral injection.  HENT: No OP obstruction.  Head: Normocephalic.  Respiratory: Effort normal, non-labored breathing.    Neurologic Examination   Mental Status: Alert, oriented x 5, thought content appropriate.  Speech fluent without evidence of aphasia.  Able  to follow all commands without difficulty. No dysarthria.  Cranial Nerves: II: Temporal visual fields intact with no extinction to DSS. PERRL  III,IV, VI: No ptosis. EOMI, but with saccadic pursuits (aka jerky smooth pursuits) noted with leftward and rightward gaze.  V: Temp sensation decreased to left side of face VII: Smile symmetric VIII: Hearing intact to voice IX,X: No hoarseness XI: Symmetric shoulder shrug XII: Midline tongue extension Motor: BUE 5/5 proximally and distally BLE 5/5 proximally and distally  No pronator drift.  Sensory: Temp sensation intact to all 4 extremities proximally without asymmetry. FT sensation to fingertips of left hand is diminished with dysesthesia, but normal more proximally. No FT sensory deficit to RUE or BLE. No extinction to DSS.  Deep Tendon Reflexes: 1+ bilateral brachioradialis, biceps and triceps. Absent patellar and achilles reflexes. Toes downgoing bilaterally.  Cerebellar: No ataxia with FNF bilaterally  Gait: Mildly antalgic (has arthritis pain to knee). Walks without assistance. No gait ataxia noted.    Labs/Imaging/Neurodiagnostic studies   CBC:  Recent Labs  Lab February 27, 2024 1730 02/27/24 1812  WBC 5.5  --   NEUTROABS 3.1  --   HGB 15.7* 16.3*  HCT 49.4* 48.0*  MCV 89.5  --   PLT 226  --    Basic Metabolic Panel:  Lab Results  Component Value Date   NA 141 February 27, 2024   K 4.4 02-27-2024   CO2 23 2024/02/27   GLUCOSE 122 (H) 02-27-2024   BUN 17 27-Feb-2024   CREATININE 0.90 27-Feb-2024   CALCIUM  9.7 02/27/24   GFRNONAA >60 02/27/24   GFRAA >60 10/31/2019   Lipid Panel:  Lab Results  Component Value Date   LDLCALC 87 09/04/2023   HgbA1c:  Lab Results  Component Value Date   HGBA1C 5.6 09/04/2023   Urine Drug Screen: No results found for: LABOPIA, COCAINSCRNUR, LABBENZ, AMPHETMU, THCU, LABBARB  Alcohol Level     Component Value Date/Time   ETH <10 09/03/2023 1042   INR  Lab Results  Component  Value Date   INR 1.0 09/03/2023   APTT  Lab Results  Component Value Date   APTT 31 09/03/2023   AED levels:  Lab Results  Component Value Date   LEVETIRACETA 21.1 10/31/2023   Recent TTE (12/10/23): 1. Left ventricular ejection fraction, by estimation, is 40 to 45%. The  left ventricle has mildly decreased function. The left ventricle  demonstrates regional wall motion abnormalities (see scoring  diagram/findings for description). Left ventricular  diastolic parameters are indeterminate. Abnormal septal motion consistent  with interventricular conduction delay.   2. Right ventricular systolic function is normal. The right ventricular  size is not well visualized. There is mildly elevated pulmonary artery  systolic pressure.   3. Left atrial size was mildly dilated.   4. The mitral valve is normal in structure. Mild mitral valve  regurgitation. No evidence of mitral stenosis.   5. Tricuspid valve regurgitation is mild to moderate.   6. The aortic valve is tricuspid. Aortic valve regurgitation is not  visualized. No aortic stenosis is present.   7. The inferior vena cava is normal in size with <50% respiratory  variability, suggesting right atrial pressure of 8 mmHg.   Recent CTA of neck (09/03/23): 1. The common carotid and internal carotid arteries are patent within the neck without stenosis. Mild atherosclerotic plaque bilaterally, as described. 2. The vertebral arteries are patent within the neck without stenosis or significant atherosclerotic disease. 3. Aortic atherosclerosis  Recent CTA of head (09/03/23): 1. No proximal intracranial large vessel occlusion identified. 2. Intracranial atherosclerotic disease as described. 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  Recent CTP of head (09/03/23): The perfusion software identifies no core infarct. The perfusion software identifies no critically hypoperfused  parenchyma (utilizing Tmax>6 seconds threshold). No mismatch volume reported.   ASSESSMENT  78 y.o. female with a PMHx of atrial fibrillation (on Eliquis ), anxiety, breast cancer s/p lumpectomy and radiation therapy, cervical cancer status post hysterectomy, non-Hodgkin's lymphoma status post chemotherapy, HFmrEF, HTN, seizure disorder, polymyalgia rheumatica who presents to the ED with left sided face, forehead and jaw numbness, as well as tingling to the fingers of her left hand. She had a similar occurrence about one month ago that had resolved with EMS at that time and she declined transport to the hospital. She did have a flu shot on Thursday. She also has a history of one focal seizure in the past which manifested with some trouble writing. She is on Keppra  and states has been compliant. She has no prior history of stroke. - Exam reveals decreased temperature sensation to left side of face, in addition to diminished FT sensation to fingertips of left hand with dysesthesia, but normal to her LUE more proximally.  - MRI brain: 6 mm acute nonhemorrhagic infarct in the lateral right thalamus with subtle T2 signal changes.  - CT head: No acute intracranial abnormality. Generalized age-related cerebral atrophy with chronic small vessel ischemic disease. - EKG: Sinus rhythm; Ventricular premature complex; Left bundle branch block - Impression: Acute right thalamic lacunar infarction, most likely secondary to chronic small vessel disease, although cardioembolic mechanism is also possible.   RECOMMENDATIONS  - Continue her home Keppra  dosing regimen.  - Continue Eliquis . Her stroke is small with low risk for hemorrhagic conversion.  - Has had recent TTE. No need to repeat, but can consider a TEE.  - Cardiac telemetry - Has had a recent CTA of head and neck, performed in April (see above) - Statin - Permissive HTN x 24 hours.  - HgbA1c, fasting lipid panel - PT consult, OT consult, Speech consult -  Risk factor modification - Frequent neuro checks - NPO until passes stroke swallow screen - Stroke Team to follow in the morning   ______________________________________________________________________   Bonney SHARK, Jailen Lung, MD [facility]

## 2024-01-31 NOTE — ED Triage Notes (Signed)
 Pt presents via [facility] from home - CC of left sided facial numbness 30 mins prior to calling EMS - tingling in the left fingers as well - Stroke scale with EMS negative.   Pt states it happened about a month ago as well but resolved with EMS at that time and decided not to go to the hospital.   Had a flu shot yesterday  Pt most recent vitals: Afib w LBB 124/70 BP 100 O2 on RA 130 BG 90 HR 20 L Hand

## 2024-01-31 NOTE — H&P (Signed)
 History and Physical    Melissa Sosa FMW:[id number] DOB: July 09, 1945 DOA: 01/31/2024  PCP: Cristopher Bottcher, NP  Patient coming from: Home  Chief Complaint: Left-sided facial numbness  HPI: Melissa Sosa is a 78 y.o. female with medical history significant of breast cancer status post lumpectomy and radiation in remission, cervical cancer status post hysterectomy, non-Hodgkin's lymphoma status post chemotherapy, HFmrEF, PAF on Eliquis , LBBB, hypertension, polymyalgia rheumatica, anxiety/depression, class I obesity, osteoarthritis, seizure disorder.  Hospital admission last month 8/5-8/7 for CHF exacerbation and A-fib with RVR.  Patient presents to the ED today for evaluation of left-sided facial numbness and tingling of the fingertips of her left hand.    ED Course: Vital signs stable. Labs notable for hemoglobin 15.7, glucose 116, troponin negative x 2, UA not suggestive of infection. CT head showing no acute intracranial abnormality. Brain MRI showing a 6 mm acute nonhemorrhagic infarct in the lateral right thalamus with subtle T2 signal changes. Showing left mastoid effusion and no obstructing nasopharyngeal lesion. Patient was given Ativan . Neurology consulted.   Review of Systems:  ROS  Past Medical History:  Diagnosis Date   Anxiety    Breast cancer (HCC)    Hypertension    PMR (polymyalgia rheumatica) (HCC)    Pneumonia     No past surgical history on file.   reports that she has never smoked. She has never used smokeless tobacco. She reports that she does not drink alcohol and does not use drugs.  Allergies  Allergen Reactions   Sulfamethoxazole-Trimethoprim Hives and Rash   Venlafaxine Hives, Itching and Other (See Comments)    Headache   Imitrex [Sumatriptan]     hypertension   Morphine And Codeine Other (See Comments)    Hyper, confusion     No family history on file.  Prior to Admission medications   Medication Sig Start Date End Date Taking? Authorizing Provider   acetaminophen  (TYLENOL ) 500 MG tablet Take 2 tablets (1,000 mg total) by mouth every 6 (six) hours as needed. Patient taking differently: Take 500-1,000 mg by mouth every 6 (six) hours as needed. 09/07/23  Yes Diona Perkins, MD  apixaban  (ELIQUIS ) 5 MG TABS tablet Take 1 tablet (5 mg total) by mouth 2 (two) times daily. 01/15/24 04/14/24 Yes Colletta Manuelita Garre, PA-C  dapagliflozin  propanediol (FARXIGA ) 10 MG TABS tablet Take 1 tablet (10 mg total) by mouth daily. Patient taking differently: Take 10 mg by mouth at bedtime. 01/15/24  Yes Colletta Manuelita Garre, PA-C  levETIRAcetam  (KEPPRA ) 750 MG tablet Take 1 tablet (750 mg total) by mouth 2 (two) times daily. 10/11/23 01/30/25 Yes Camara, Amadou, MD  metoprolol  succinate (TOPROL -XL) 50 MG 24 hr tablet Take 1 tablet (50 mg total) by mouth daily. Take with or immediately following a meal. 12/30/23  Yes Colletta Manuelita Garre, PA-C  Multiple Vitamin (MULTIVITAMIN) tablet Take 1 tablet by mouth daily.   Yes [provider]  nortriptyline  (PAMELOR ) 25 MG capsule Take 25 mg by mouth at bedtime.   Yes [provider]  nystatin (MYCOSTATIN/NYSTOP) powder Apply 1 Application topically See admin instructions. Application Topical 2-3 Times Daily 01/28/24  Yes [provider]  sacubitril -valsartan  (ENTRESTO ) 24-26 MG Take 1 tablet by mouth 2 (two) times daily. 01/16/24  Yes Colletta Manuelita Garre, PA-C  furosemide  (LASIX ) 40 MG tablet Take one tablet  (40 mg) for weight gain greater than 3 pounds in 24 hours or 5 pounds in a week. Patient not taking: Reported on 01/31/2024 12/30/23   Colletta Manuelita Garre, PA-C  Physical Exam: Vitals:   01/31/24 2015 01/31/24 2030 01/31/24 2100 01/31/24 2103  BP: 133/78 (!) 142/71 (!) 140/71   Pulse: 82 79 79   Resp: 12 15 18    Temp:    97.6 F (36.4 C)  TempSrc:    Oral  SpO2: 100% 100% 100%   Weight:      Height:        Physical Exam  Labs on Admission: I have personally reviewed following labs  and imaging studies  CBC: Recent Labs  Lab 01/31/24 1730 01/31/24 1812  WBC 5.5  --   NEUTROABS 3.1  --   HGB 15.7* 16.3*  HCT 49.4* 48.0*  MCV 89.5  --   PLT 226  --    Basic Metabolic Panel: Recent Labs  Lab 01/31/24 1730 01/31/24 1812  NA 139 141  K 4.3 4.4  CL 106 107  CO2 23  --   GLUCOSE 116* 122*  BUN 15 17  CREATININE 0.89 0.90  CALCIUM  9.7  --   MG 2.0  --    GFR: Estimated Creatinine Clearance: 57.8 mL/min (by C-G formula based on SCr of 0.9 mg/dL). Liver Function Tests: No results for input(s): AST, ALT, ALKPHOS, BILITOT, PROT, ALBUMIN in the last 168 hours. No results for input(s): LIPASE, AMYLASE in the last 168 hours. No results for input(s): AMMONIA in the last 168 hours. Coagulation Profile: No results for input(s): INR, PROTIME in the last 168 hours. Cardiac Enzymes: No results for input(s): CKTOTAL, CKMB, CKMBINDEX, TROPONINI in the last 168 hours. BNP (last 3 results) No results for input(s): PROBNP in the last 8760 hours. HbA1C: No results for input(s): HGBA1C in the last 72 hours. CBG: No results for input(s): GLUCAP in the last 168 hours. Lipid Profile: No results for input(s): CHOL, HDL, LDLCALC, TRIG, CHOLHDL, LDLDIRECT in the last 72 hours. Thyroid  Function Tests: No results for input(s): TSH, T4TOTAL, FREET4, T3FREE, THYROIDAB in the last 72 hours. Anemia Panel: No results for input(s): VITAMINB12, FOLATE, FERRITIN, TIBC, IRON, RETICCTPCT in the last 72 hours. Urine analysis:    Component Value Date/Time   COLORURINE YELLOW 01/31/2024 1920   APPEARANCEUR HAZY (A) 01/31/2024 1920   LABSPEC 1.010 01/31/2024 1920   PHURINE 6.0 01/31/2024 1920   GLUCOSEU >=500 (A) 01/31/2024 1920   HGBUR NEGATIVE 01/31/2024 1920   BILIRUBINUR NEGATIVE 01/31/2024 1920   KETONESUR NEGATIVE 01/31/2024 1920   PROTEINUR NEGATIVE 01/31/2024 1920   NITRITE NEGATIVE 01/31/2024 1920    LEUKOCYTESUR NEGATIVE 01/31/2024 1920    Radiological Exams on Admission: MR BRAIN WO CONTRAST Result Date: 01/31/2024 EXAM: MRI BRAIN WITHOUT CONTRAST 01/31/2024 07:47:38 PM TECHNIQUE: Multiplanar multisequence MRI of the head/brain was performed without the administration of intravenous contrast. COMPARISON: CT head without contrast 01/31/2024. MRI head 09/03/2023. CLINICAL HISTORY: Left sided facial numbness and tingling in the left fingers, symptoms occurred about a month ago as well but resolved without hospital visit. Patient had a flu shot yesterday. FINDINGS: BRAIN AND VENTRICLES: A 6 mm acute nonhemorrhagic infarct is present in the lateral right thalamus with subtle T2 signal changes. Periventricular white matter changes are stable and essentially within normal limits for age. No intracranial hemorrhage. No mass. No midline shift. No hydrocephalus. The sella is unremarkable. Normal flow voids. ORBITS: No acute abnormality. SINUSES AND MASTOIDS: Left mastoid effusion is present. No obstructing nasopharyngeal lesion is present. BONES AND SOFT TISSUES: Normal marrow signal. No acute soft tissue abnormality. IMPRESSION: 1. 6 mm acute nonhemorrhagic infarct in the lateral right  thalamus with subtle T2 signal changes. 2. Left mastoid effusion. No obstructing nasopharyngeal lesion. Electronically signed by: Lonni Necessary MD 01/31/2024 07:57 PM EDT RP Workstation: [id number]   CT Head Wo Contrast Result Date: 01/31/2024 CLINICAL DATA:  Initial evaluation for acute left-sided numbness. EXAM: CT HEAD WITHOUT CONTRAST TECHNIQUE: Contiguous axial images were obtained from the base of the skull through the vertex without intravenous contrast. RADIATION DOSE REDUCTION: This exam was performed according to the departmental dose-optimization program which includes automated exposure control, adjustment of the mA and/or kV according to patient size and/or use of iterative reconstruction technique. COMPARISON:   Prior study from 10/31/2023. FINDINGS: Brain: Generalized age-related cerebral atrophy. Patchy hypodensity involving the supratentorial cerebral white matter, consistent with chronic small vessel ischemic disease. No acute intracranial hemorrhage. No acute large vessel territory infarct. No mass lesion or midline shift. No hydrocephalus or extra-axial fluid collection. Vascular: No abnormal hyperdense vessel. Calcified atherosclerosis present at the skull base. Skull: Scalp soft tissues within normal limits for a calvarium intact. Sinuses/Orbits: Globes and orbital soft tissues within normal limits. Paranasal sinuses are clear. No significant mastoid effusion. Other: None. IMPRESSION: 1. No acute intracranial abnormality. 2. Generalized age-related cerebral atrophy with chronic small vessel ischemic disease. Electronically Signed   By: Morene Hoard M.D.   On: 01/31/2024 19:27    EKG: Independently reviewed. Sinus rhythm, PVCs, LBBB is not new.   Assessment and Plan  Acute nonhemorrhagic right thalamic infarct -Neurology consulted -Patient is already on Eliquis .  Antithrombotic therapy per neurology. -Telemetry monitoring -TTE -Hemoglobin A1c, fasting lipid panel -Aspirin  325 p.o. now and daily -Atorvastatin 80 mg now and daily -Frequent neurochecks -PT, OT, speech therapy. -N.p.o. until cleared by bedside swallow evaluation or formal speech evaluation   Hypertension Hold home antihypertensives and allow permissive hypertension at this time.  Chronic HFmrEF TTE done 12/10/2023 showing EF 40 to 45%, mild mitral regurgitation, and mild to moderate tricuspid regurgitation.  No signs of volume overload at this time.  Repeat echo ordered given acute stroke.  Paroxysmal A-fib Currently in sinus rhythm.  Continue Eliquis .  Polymyalgia rheumatica  Anxiety/depression  Seizure disorder     DVT prophylaxis: {Blank single:19197::Lovenox ,SQ Heparin ,IV heparin   gtt,Xarelto,Eliquis ,Coumadin,SCDs,***} Code Status: {Blank single:19197::Full Code,DNR,DNR/DNI,Comfort Care,***} Family Communication: ***  Consults called: ***  Level of care: {Blank single:19197::Med-Surg,Telemetry bed,Progressive Care Unit,Step Down Unit} Admission status: *** Time Spent: 75+ minutes***  Editha Ram MD [facility]  If 7PM-7AM, please contact night-coverage [email or web address]  01/31/2024, 11:35 PM

## 2024-01-31 NOTE — ED Provider Notes (Signed)
 [place] EMERGENCY DEPARTMENT AT [facility] Provider Note   CSN: [id number] Arrival date & time: 01/31/24  1646     Patient presents with: Numbness and Tingling   Chaselynn Kepple is a 78 y.o. female.   78 year old female presents for evaluation of left-sided numbness.  She states this started around 330.  Had some numbness of her left face and jaw as well as forehead.  States the forehead resolved but she still with some left-sided facial numbness as well as some left fingertip numbness.  She states this has happened before and it went away on its own.  States she did not go to the hospital at that time and she thought it was an anxiety attack.  States she has a history of 1 focal seizure in the past in which she had some trouble writing.  She is on Keppra  and states has been compliant.  Also with a history of A-fib and she is on Eliquis .  Denies any other symptoms or concerns.        Prior to Admission medications   Medication Sig Start Date End Date Taking? Authorizing Provider  acetaminophen  (TYLENOL ) 500 MG tablet Take 2 tablets (1,000 mg total) by mouth every 6 (six) hours as needed. Patient taking differently: Take 500-1,000 mg by mouth every 6 (six) hours as needed. 09/07/23  Yes Diona Perkins, MD  apixaban  (ELIQUIS ) 5 MG TABS tablet Take 1 tablet (5 mg total) by mouth 2 (two) times daily. 01/15/24 04/14/24 Yes Colletta Manuelita Garre, PA-C  dapagliflozin  propanediol (FARXIGA ) 10 MG TABS tablet Take 1 tablet (10 mg total) by mouth daily. Patient taking differently: Take 10 mg by mouth at bedtime. 01/15/24  Yes Colletta Manuelita Garre, PA-C  levETIRAcetam  (KEPPRA ) 750 MG tablet Take 1 tablet (750 mg total) by mouth 2 (two) times daily. 10/11/23 01/30/25 Yes Camara, Amadou, MD  metoprolol  succinate (TOPROL -XL) 50 MG 24 hr tablet Take 1 tablet (50 mg total) by mouth daily. Take with or immediately following a meal. 12/30/23  Yes Colletta Manuelita Garre, PA-C  Multiple Vitamin  (MULTIVITAMIN) tablet Take 1 tablet by mouth daily.   Yes [provider]  nortriptyline  (PAMELOR ) 25 MG capsule Take 25 mg by mouth at bedtime.   Yes [provider]  nystatin (MYCOSTATIN/NYSTOP) powder Apply 1 Application topically See admin instructions. Application Topical 2-3 Times Daily 01/28/24  Yes [provider]  sacubitril -valsartan  (ENTRESTO ) 24-26 MG Take 1 tablet by mouth 2 (two) times daily. 01/16/24  Yes Colletta Manuelita Garre, PA-C  furosemide  (LASIX ) 40 MG tablet Take one tablet  (40 mg) for weight gain greater than 3 pounds in 24 hours or 5 pounds in a week. Patient not taking: Reported on 01/31/2024 12/30/23   Colletta Manuelita Garre, PA-C    Allergies: Sulfamethoxazole-trimethoprim, Venlafaxine, Imitrex [sumatriptan], and Morphine and codeine    Review of Systems  Constitutional:  Negative for chills and fever.  HENT:  Negative for ear pain and sore throat.   Eyes:  Negative for pain and visual disturbance.  Respiratory:  Negative for cough and shortness of breath.   Cardiovascular:  Negative for chest pain and palpitations.  Gastrointestinal:  Negative for abdominal pain and vomiting.  Genitourinary:  Negative for dysuria and hematuria.  Musculoskeletal:  Negative for arthralgias and back pain.  Skin:  Negative for color change and rash.  Neurological:  Positive for numbness. Negative for seizures and syncope.  All other systems reviewed and are negative.   Updated Vital Signs BP 133/78  Pulse 82   Temp 97.6 F (36.4 C) (Oral)   Resp 12   Ht 5' 6 (1.676 m)   Wt 85.7 kg   SpO2 100%   BMI 30.51 kg/m   Physical Exam Vitals and nursing note reviewed.  Constitutional:      General: She is not in acute distress.    Appearance: She is well-developed.  HENT:     Head: Normocephalic and atraumatic.  Eyes:     Conjunctiva/sclera: Conjunctivae normal.  Cardiovascular:     Rate and Rhythm: Normal rate and regular rhythm.     Heart sounds:  No murmur heard. Pulmonary:     Effort: Pulmonary effort is normal. No respiratory distress.     Breath sounds: Normal breath sounds.  Abdominal:     Palpations: Abdomen is soft.     Tenderness: There is no abdominal tenderness.  Musculoskeletal:        General: No swelling.     Cervical back: Neck supple.  Skin:    General: Skin is warm and dry.     Capillary Refill: Capillary refill takes less than 2 seconds.  Neurological:     Mental Status: She is alert.     Comments: Some very mild sensory deficits on the left, left fingertip sensation deficit as well as left face and chin deficit  Psychiatric:        Mood and Affect: Mood normal.     (all labs ordered are listed, but only abnormal results are displayed) Labs Reviewed  BASIC METABOLIC PANEL WITH GFR - Abnormal; Notable for the following components:      Result Value   Glucose, Bld 116 (*)    All other components within normal limits  CBC WITH DIFFERENTIAL/PLATELET - Abnormal; Notable for the following components:   RBC 5.52 (*)    Hemoglobin 15.7 (*)    HCT 49.4 (*)    All other components within normal limits  URINALYSIS, ROUTINE W REFLEX MICROSCOPIC - Abnormal; Notable for the following components:   APPearance HAZY (*)    Glucose, UA >=500 (*)    All other components within normal limits  I-STAT CHEM 8, ED - Abnormal; Notable for the following components:   Glucose, Bld 122 (*)    Hemoglobin 16.3 (*)    HCT 48.0 (*)    All other components within normal limits  MAGNESIUM   TROPONIN I (HIGH SENSITIVITY)  TROPONIN I (HIGH SENSITIVITY)    EKG: EKG Interpretation Date/Time:  Friday January 31 2024 17:02:44 EDT Ventricular Rate:  80 PR Interval:  197 QRS Duration:  139 QT Interval:  417 QTC Calculation: 482 R Axis:   12  Text Interpretation: Sinus rhythm Ventricular premature complex Left bundle branch block Compared with prior EKG from 12/30/2023 Confirmed by Gennaro Bouchard ([id number]) on 01/31/2024 5:30:53  PM  Radiology: MR BRAIN WO CONTRAST Result Date: 01/31/2024 EXAM: MRI BRAIN WITHOUT CONTRAST 01/31/2024 07:47:38 PM TECHNIQUE: Multiplanar multisequence MRI of the head/brain was performed without the administration of intravenous contrast. COMPARISON: CT head without contrast 01/31/2024. MRI head 09/03/2023. CLINICAL HISTORY: Left sided facial numbness and tingling in the left fingers, symptoms occurred about a month ago as well but resolved without hospital visit. Patient had a flu shot yesterday. FINDINGS: BRAIN AND VENTRICLES: A 6 mm acute nonhemorrhagic infarct is present in the lateral right thalamus with subtle T2 signal changes. Periventricular white matter changes are stable and essentially within normal limits for age. No intracranial hemorrhage. No mass. No midline shift. No  hydrocephalus. The sella is unremarkable. Normal flow voids. ORBITS: No acute abnormality. SINUSES AND MASTOIDS: Left mastoid effusion is present. No obstructing nasopharyngeal lesion is present. BONES AND SOFT TISSUES: Normal marrow signal. No acute soft tissue abnormality. IMPRESSION: 1. 6 mm acute nonhemorrhagic infarct in the lateral right thalamus with subtle T2 signal changes. 2. Left mastoid effusion. No obstructing nasopharyngeal lesion. Electronically signed by: Lonni Necessary MD 01/31/2024 07:57 PM EDT RP Workstation: [id number]   CT Head Wo Contrast Result Date: 01/31/2024 CLINICAL DATA:  Initial evaluation for acute left-sided numbness. EXAM: CT HEAD WITHOUT CONTRAST TECHNIQUE: Contiguous axial images were obtained from the base of the skull through the vertex without intravenous contrast. RADIATION DOSE REDUCTION: This exam was performed according to the departmental dose-optimization program which includes automated exposure control, adjustment of the mA and/or kV according to patient size and/or use of iterative reconstruction technique. COMPARISON:  Prior study from 10/31/2023. FINDINGS: Brain: Generalized  age-related cerebral atrophy. Patchy hypodensity involving the supratentorial cerebral white matter, consistent with chronic small vessel ischemic disease. No acute intracranial hemorrhage. No acute large vessel territory infarct. No mass lesion or midline shift. No hydrocephalus or extra-axial fluid collection. Vascular: No abnormal hyperdense vessel. Calcified atherosclerosis present at the skull base. Skull: Scalp soft tissues within normal limits for a calvarium intact. Sinuses/Orbits: Globes and orbital soft tissues within normal limits. Paranasal sinuses are clear. No significant mastoid effusion. Other: None. IMPRESSION: 1. No acute intracranial abnormality. 2. Generalized age-related cerebral atrophy with chronic small vessel ischemic disease. Electronically Signed   By: Morene Hoard M.D.   On: 01/31/2024 19:27     Procedures   Medications Ordered in the ED  LORazepam  (ATIVAN ) injection 1 mg (1 mg Intravenous Given 01/31/24 1917)  levETIRAcetam  (KEPPRA ) tablet 750 mg (750 mg Oral Given 01/31/24 2133)                                    Medical Decision Making Cardiac monitor interpretation: A-fib, rate controlled  Patient send workup reviewed by me and unremarkable.  She presently has an acute stroke on her MRI.  Is still having some deficits.  They are very mild which is some facial numbness and finger numbness.  She is on Eliquis  and has been compliant.  Patient's case discussed with neurology and hospitalist as below and she will be admitted for further workup and management.  Patient and family bedside are agreeable with the plan.  All results were discussed with them.  Problems Addressed: Acute CVA (cerebrovascular accident) [facility]): undiagnosed new problem with uncertain prognosis  Amount and/or Complexity of Data Reviewed External Data Reviewed: notes.    Details: Prior ED and hospital records reviewed and patient was seen in the past for focal seizure and started on  Keppra  Labs: ordered. Decision-making details documented in ED Course.    Details: Ordered and reviewed by me and unremarkable Radiology: ordered and independent interpretation performed. Decision-making details documented in ED Course.    Details: Ordered and reviewed by me CT head: Shows evidence of old infarct MRI brain: Shows evidence of acute right sided nonhemorrhagic infarct ECG/medicine tests: ordered and independent interpretation performed. Decision-making details documented in ED Course.    Details: Ordered and interpreted by me in the absence of cardiology and shows A-fib, rate controlled, no STEMI or acute change when compared to prior Discussion of management or test interpretation with external provider(s): Dr. Ovidio - neurology -I  spoke with him on the phone regarding the patient he recommended medicine admit and they will consult  Dr. Alfornia -hospitalist-I spoke with her on the phone regarding the patient's case and she will admit the patient for further workup and management  Risk OTC drugs. Prescription drug management. Decision regarding hospitalization.     Final diagnoses:  Acute CVA (cerebrovascular accident) [facility])    ED Discharge Orders     None          Gennaro Duwaine CROME, DO 01/31/24 2247

## 2024-02-01 ENCOUNTER — Observation Stay (HOSPITAL_COMMUNITY)

## 2024-02-01 ENCOUNTER — Encounter (HOSPITAL_COMMUNITY): Payer: Self-pay | Admitting: Internal Medicine

## 2024-02-01 DIAGNOSIS — I5022 Chronic systolic (congestive) heart failure: Secondary | ICD-10-CM | POA: Diagnosis not present

## 2024-02-01 DIAGNOSIS — E66811 Obesity, class 1: Secondary | ICD-10-CM

## 2024-02-01 DIAGNOSIS — I10 Essential (primary) hypertension: Secondary | ICD-10-CM | POA: Diagnosis not present

## 2024-02-01 DIAGNOSIS — G40909 Epilepsy, unspecified, not intractable, without status epilepticus: Secondary | ICD-10-CM

## 2024-02-01 DIAGNOSIS — I6389 Other cerebral infarction: Secondary | ICD-10-CM

## 2024-02-01 DIAGNOSIS — R29702 NIHSS score 2: Secondary | ICD-10-CM

## 2024-02-01 DIAGNOSIS — E785 Hyperlipidemia, unspecified: Secondary | ICD-10-CM | POA: Diagnosis not present

## 2024-02-01 DIAGNOSIS — I6381 Other cerebral infarction due to occlusion or stenosis of small artery: Secondary | ICD-10-CM | POA: Diagnosis not present

## 2024-02-01 DIAGNOSIS — I6522 Occlusion and stenosis of left carotid artery: Secondary | ICD-10-CM | POA: Diagnosis not present

## 2024-02-01 DIAGNOSIS — I639 Cerebral infarction, unspecified: Secondary | ICD-10-CM | POA: Diagnosis not present

## 2024-02-01 DIAGNOSIS — R200 Anesthesia of skin: Secondary | ICD-10-CM | POA: Diagnosis not present

## 2024-02-01 DIAGNOSIS — I6350 Cerebral infarction due to unspecified occlusion or stenosis of unspecified cerebral artery: Secondary | ICD-10-CM | POA: Diagnosis not present

## 2024-02-01 DIAGNOSIS — I480 Paroxysmal atrial fibrillation: Secondary | ICD-10-CM

## 2024-02-01 DIAGNOSIS — I739 Peripheral vascular disease, unspecified: Secondary | ICD-10-CM | POA: Diagnosis not present

## 2024-02-01 LAB — LIPID PANEL
Cholesterol: 160 mg/dL (ref 0–200)
HDL: 52 mg/dL (ref >40)
LDL Cholesterol: 86 mg/dL (ref 0–99)
Total CHOL/HDL Ratio: 3.1 ratio
Triglycerides: 112 mg/dL (ref <150)
VLDL: 22 mg/dL (ref 0–40)

## 2024-02-01 LAB — ECHOCARDIOGRAM COMPLETE
Height: 66 in
S' Lateral: 2.5 cm
Weight: 3024 [oz_av]

## 2024-02-01 LAB — HEMOGLOBIN A1C
Hgb A1c MFr Bld: 6 % — ABNORMAL HIGH (ref 4.8–5.6)
Mean Plasma Glucose: 125.5 mg/dL

## 2024-02-01 MED ORDER — ASPIRIN 81 MG PO TBEC: 81.0000 mg | DELAYED_RELEASE_TABLET | Freq: Every day | ORAL | Status: AC

## 2024-02-01 MED ORDER — ENOXAPARIN SODIUM 40 MG/0.4ML IJ SOSY: 40.0000 mg | PREFILLED_SYRINGE | INTRAMUSCULAR | Status: DC

## 2024-02-01 MED ORDER — NORTRIPTYLINE HCL 25 MG PO CAPS
25.0000 mg | ORAL_CAPSULE | Freq: Every day | ORAL | Status: DC
  Administered 2024-02-01: 25 mg via ORAL
  Filled 2024-02-01 (×2): qty 1

## 2024-02-01 MED ORDER — PERFLUTREN LIPID MICROSPHERE
1.0000 mL | INTRAVENOUS | Status: DC | PRN
  Administered 2024-02-01: 3 mL via INTRAVENOUS

## 2024-02-01 MED ORDER — ROSUVASTATIN CALCIUM 5 MG PO TABS
10.0000 mg | ORAL_TABLET | Freq: Every day | ORAL | Status: DC
Start: 2024-02-01 — End: 2024-02-01

## 2024-02-01 MED ORDER — STROKE: EARLY STAGES OF RECOVERY BOOK: Freq: Once | Status: DC

## 2024-02-01 MED ORDER — ACETAMINOPHEN 325 MG PO TABS: 650.0000 mg | ORAL_TABLET | Freq: Four times a day (QID) | ORAL | Status: DC | PRN

## 2024-02-01 MED ORDER — APIXABAN 5 MG PO TABS
5.0000 mg | ORAL_TABLET | Freq: Two times a day (BID) | ORAL | Status: DC
  Administered 2024-02-01 (×2): 5 mg via ORAL
  Filled 2024-02-01 (×2): qty 1

## 2024-02-01 MED ORDER — ACETAMINOPHEN 650 MG RE SUPP: 650.0000 mg | Freq: Four times a day (QID) | RECTAL | Status: DC | PRN

## 2024-02-01 MED ORDER — IOHEXOL 350 MG/ML SOLN
75.0000 mL | Freq: Once | INTRAVENOUS | Status: AC | PRN
  Administered 2024-02-01: 75 mL via INTRAVENOUS

## 2024-02-01 MED ORDER — ASPIRIN 81 MG PO TBEC
81.0000 mg | DELAYED_RELEASE_TABLET | Freq: Every day | ORAL | Status: DC
  Administered 2024-02-01: 81 mg via ORAL
  Filled 2024-02-01: qty 1

## 2024-02-01 MED ORDER — ROSUVASTATIN CALCIUM 20 MG PO TABS
20.0000 mg | ORAL_TABLET | Freq: Every day | ORAL | Status: DC
  Administered 2024-02-01: 20 mg via ORAL
  Filled 2024-02-01: qty 1

## 2024-02-01 MED ORDER — LEVETIRACETAM 500 MG PO TABS
750.0000 mg | ORAL_TABLET | Freq: Two times a day (BID) | ORAL | Status: DC
  Administered 2024-02-01: 750 mg via ORAL
  Filled 2024-02-01: qty 1

## 2024-02-01 MED ORDER — ACETAMINOPHEN 160 MG/5ML PO SOLN: 650.0000 mg | Freq: Four times a day (QID) | ORAL | Status: DC | PRN

## 2024-02-01 MED ORDER — DAPAGLIFLOZIN PROPANEDIOL 10 MG PO TABS
10.0000 mg | ORAL_TABLET | Freq: Every day | ORAL | Status: DC
  Administered 2024-02-01: 10 mg via ORAL
  Filled 2024-02-01: qty 1

## 2024-02-01 MED ORDER — ROSUVASTATIN CALCIUM 20 MG PO TABS: 20.0000 mg | ORAL_TABLET | Freq: Every day | ORAL | 0 refills | Status: DC

## 2024-02-01 NOTE — ED Notes (Signed)
The pt passed her swallow screen 

## 2024-02-01 NOTE — ED Notes (Signed)
Pt ambulated to and from bathroom. Tolerated well.

## 2024-02-01 NOTE — Discharge Summary (Signed)
 [facility] Physician Discharge Summary   Patient name: Melissa Sosa  Admit date:     01/31/2024  Discharge date: 02/01/2024  Attending Physician: ALFORNIA MADISON [[id number]]  Discharge Physician: Camellia Door   PCP: Cristopher Bottcher, NP  Admitted From: Home  Disposition:  Home  Recommendations for Outpatient Follow-up:  Follow up with PCP in 1-2 weeks F/u with outpatient neurology  Home Health:No Equipment/Devices: None  Discharge Condition:Stable CODE STATUS:FULL Diet recommendation: Heart Healthy Fluid Restriction: None  Hospital Summary: CC: left facial numbness HPI: Melissa Sosa is a 78 y.o. female with medical history significant of breast cancer status post lumpectomy and radiation in remission, cervical cancer status post hysterectomy, non-Hodgkin's lymphoma status post chemotherapy, HFmrEF, PAF on Eliquis , LBBB, hypertension, polymyalgia rheumatica, anxiety/depression, class I obesity, osteoarthritis, seizure disorder.  Hospital admission last month 8/5-8/7 for CHF exacerbation and A-fib with RVR.  Patient presents to the ED today for evaluation of left-sided facial numbness and numbness/tingling of the fingertips of her left hand since 3 PM today.  Denies experiencing any changes in vision, difficulty with speech, or weakness of arm or leg.  Denies history of previous stroke.  She is compliant with her home Eliquis .  No other complaints.  Denies cough, shortness of breath, chest pain, vomiting, or abdominal pain.   ED Course: Vital signs stable. Labs notable for hemoglobin 15.7, glucose 116, troponin negative x 2, UA not suggestive of infection. CT head showing no acute intracranial abnormality. Brain MRI showing a 6 mm acute nonhemorrhagic infarct in the lateral right thalamus with subtle T2 signal changes. Showing left mastoid effusion and no obstructing nasopharyngeal lesion. Patient was given Ativan  and Keppra . Neurology consulted.   Significant Events: Admitted  01/31/2024 acute CVA of right thalamus   Admission Labs: WBC 5.5, HgB 15.7, plt 226 Mg 2.0 Na 139, K 4.3, CO2 of 23, BUN 15, Scr 0.89, glu 116 UA glu >500, otherwise negative A1C of 6.0% T. Chol 160, HDL 52, LDL 86, TG 112  Admission Imaging Studies: CT head No acute intracranial abnormality. 2. Generalized age-related cerebral atrophy with chronic small vessel ischemic disease  Significant Labs:   Significant Imaging Studies: MRI brain 6 mm acute nonhemorrhagic infarct in the lateral right thalamus with subtle T2 signal changes. 2. Left mastoid effusion. No obstructing nasopharyngeal lesion. CTA head/neck Negative for large vessel occlusion. Positive for progressed since April, moderate Right PCA irregularity and stenosis likely affecting the Right Thalamostriate Artery region. 2. Otherwise stable CTA Head and Neck. Mild for age atherosclerosis and no other significant stenosis. 3. Stable non contrast CT appearance of the brain. Echo LVEF 40-45%. No intra-atrial shunt.  Antibiotic Therapy: Anti-infectives (From admission, onward)    None       Procedures:   Consultants: neurology   Hospital Course by Problem: * Acute CVA (cerebrovascular accident) (HCC) 02-01-2024 MRI brain showed small 6 mm CVA. Pt with only some residual left sided facial numbness and left finger numbness. No PT/OT needs identified.  Neurology has given discharge clearance. Only added asa 81 mg and crestor  20 mg to therapy. Continue with eliquis . Echo shows LVEF 40-45%. Stable. No thrombus. Stable for DC.  Lipid Panel: Lab Results  Component Value Date/Time   CHOL 160 02/01/2024 01:55 AM   TRIG 112 02/01/2024 01:55 AM   HDL 52 02/01/2024 01:55 AM   CHOLHDL 3.1 02/01/2024 01:55 AM   VLDL 22 02/01/2024 01:55 AM   LDLCALC 86 02/01/2024 01:55 AM    Obesity, Class I, BMI 30-34.9 Body  mass index is 30.51 kg/m.   Paroxysmal atrial fibrillation (HCC) 02-01-2024 on Eliquis , Toprol -XL.  Chronic  systolic CHF (congestive heart failure) (HCC) - LVEF 40-45% 02-01-2024 on Toprol -XL and entresto . F/u with outpatient cards.  Seizure disorder (HCC) 02-01-2024 continue keppra  750 mg bid. F/u with outpatient neurology.  Essential hypertension 02-01-2024 cont Toprol -XL.    Discharge Diagnoses:  Principal Problem:   Acute CVA (cerebrovascular accident) [facility]) Active Problems:   Essential hypertension   Seizure disorder (HCC)   Chronic systolic CHF (congestive heart failure) (HCC) - LVEF 40-45%   Paroxysmal atrial fibrillation (HCC)   Obesity, Class I, BMI 30-34.9   Discharge Instructions  Discharge Instructions     Call MD for:  difficulty breathing, headache or visual disturbances   Complete by: As directed    Call MD for:  extreme fatigue   Complete by: As directed    Call MD for:  hives   Complete by: As directed    Call MD for:  persistant dizziness or light-headedness   Complete by: As directed    Call MD for:  persistant nausea and vomiting   Complete by: As directed    Call MD for:  redness, tenderness, or signs of infection (pain, swelling, redness, odor or green/yellow discharge around incision site)   Complete by: As directed    Call MD for:  severe uncontrolled pain   Complete by: As directed    Call MD for:  temperature >100.4   Complete by: As directed    Diet - low sodium heart healthy   Complete by: As directed    Discharge instructions   Complete by: As directed    1. Follow up with your primary care provider in 1-2 weeks following discharge from hospital. 2. Follow up with neurology as scheduled.   Increase activity slowly   Complete by: As directed       Allergies as of 02/01/2024       Reactions   Sulfamethoxazole-trimethoprim Hives, Rash   Venlafaxine Hives, Itching, Other (See Comments)   Headache   Imitrex [sumatriptan]    hypertension   Morphine And Codeine Other (See Comments)   Hyper, confusion         Medication List     TAKE  these medications    acetaminophen  500 MG tablet Commonly known as: TYLENOL  Take 2 tablets (1,000 mg total) by mouth every 6 (six) hours as needed. What changed: how much to take   apixaban  5 MG Tabs tablet Commonly known as: ELIQUIS  Take 1 tablet (5 mg total) by mouth 2 (two) times daily.   aspirin  EC 81 MG tablet Take 1 tablet (81 mg total) by mouth daily. Swallow whole. Start taking on: February 02, 2024   dapagliflozin  propanediol 10 MG Tabs tablet Commonly known as: FARXIGA  Take 1 tablet (10 mg total) by mouth daily. What changed: when to take this   furosemide  40 MG tablet Commonly known as: Lasix  Take one tablet  (40 mg) for weight gain greater than 3 pounds in 24 hours or 5 pounds in a week.   levETIRAcetam  750 MG tablet Commonly known as: KEPPRA  Take 1 tablet (750 mg total) by mouth 2 (two) times daily.   metoprolol  succinate 50 MG 24 hr tablet Commonly known as: TOPROL -XL Take 1 tablet (50 mg total) by mouth daily. Take with or immediately following a meal.   multivitamin tablet Take 1 tablet by mouth daily.   nortriptyline  25 MG capsule Commonly known as: PAMELOR  Take 25  mg by mouth at bedtime.   nystatin powder Commonly known as: MYCOSTATIN/NYSTOP Apply 1 Application topically See admin instructions. Application Topical 2-3 Times Daily   rosuvastatin  20 MG tablet Commonly known as: CRESTOR  Take 1 tablet (20 mg total) by mouth daily. Start taking on: February 02, 2024   sacubitril -valsartan  24-26 MG Commonly known as: ENTRESTO  Take 1 tablet by mouth 2 (two) times daily.        Allergies  Allergen Reactions   Sulfamethoxazole-Trimethoprim Hives and Rash   Venlafaxine Hives, Itching and Other (See Comments)    Headache   Imitrex [Sumatriptan]     hypertension   Morphine And Codeine Other (See Comments)    Hyper, confusion     Discharge Exam: Vitals:   02/01/24 1326 02/01/24 1402  BP:  107/75  Pulse:  93  Resp:  20  Temp: 98.1 F  (36.7 C)   SpO2:  100%    Physical Exam Vitals and nursing note reviewed.  Constitutional:      General: She is not in acute distress.    Appearance: She is not toxic-appearing or diaphoretic.  HENT:     Head: Normocephalic and atraumatic.     Nose: Nose normal.  Eyes:     General: No scleral icterus. Cardiovascular:     Rate and Rhythm: Normal rate and regular rhythm.  Pulmonary:     Effort: Pulmonary effort is normal. No respiratory distress.     Breath sounds: Normal breath sounds. No wheezing.  Abdominal:     General: Bowel sounds are normal. There is no distension.     Palpations: Abdomen is soft.     Tenderness: There is no abdominal tenderness.  Musculoskeletal:     Right lower leg: No edema.     Left lower leg: No edema.  Skin:    General: Skin is warm and dry.     Capillary Refill: Capillary refill takes less than 2 seconds.  Neurological:     General: No focal deficit present.     Mental Status: She is alert and oriented to person, place, and time.    The results of significant diagnostics from this hospitalization (including imaging, microbiology, ancillary and laboratory) are listed below for reference.    Labs: BNP (last 3 results) Recent Labs    12/09/23 1736 12/24/23 1505 12/30/23 0937  BNP 686.2* 1,036.5* 522.1*   Basic Metabolic Panel: Recent Labs  Lab 01/31/24 1730 01/31/24 1812  NA 139 141  K 4.3 4.4  CL 106 107  CO2 23  --   GLUCOSE 116* 122*  BUN 15 17  CREATININE 0.89 0.90  CALCIUM  9.7  --   MG 2.0  --    CBC: Recent Labs  Lab 01/31/24 1730 01/31/24 1812  WBC 5.5  --   NEUTROABS 3.1  --   HGB 15.7* 16.3*  HCT 49.4* 48.0*  MCV 89.5  --   PLT 226  --    Hgb A1c Recent Labs    02/01/24 0155  HGBA1C 6.0*   Lipid Profile Recent Labs    02/01/24 0155  CHOL 160  HDL 52  LDLCALC 86  TRIG 112  CHOLHDL 3.1   Urinalysis    Component Value Date/Time   COLORURINE YELLOW 01/31/2024 1920   APPEARANCEUR HAZY (A)  01/31/2024 1920   LABSPEC 1.010 01/31/2024 1920   PHURINE 6.0 01/31/2024 1920   GLUCOSEU >=500 (A) 01/31/2024 1920   HGBUR NEGATIVE 01/31/2024 1920   BILIRUBINUR NEGATIVE 01/31/2024 1920  KETONESUR NEGATIVE 01/31/2024 1920   PROTEINUR NEGATIVE 01/31/2024 1920   NITRITE NEGATIVE 01/31/2024 1920   LEUKOCYTESUR NEGATIVE 01/31/2024 1920   Sepsis Labs Recent Labs  Lab 01/31/24 1730  WBC 5.5    Procedures/Studies: ECHOCARDIOGRAM COMPLETE Result Date: 02/01/2024    ECHOCARDIOGRAM REPORT   Patient Name:   ZIYANA MORIKAWA Date of Exam: 02/01/2024 Medical Rec #:  [id number]     Height:       66.0 in Accession #:    [id number]    Weight:       189.0 lb Date of Birth:  1945-05-26      BSA:          1.952 m Patient Age:    78 years      BP:           106/65 mmHg Patient Gender: F             HR:           93 bpm. Exam Location:  Inpatient Procedure: 2D Echo, Color Doppler and Cardiac Doppler (Both Spectral and Color            Flow Doppler were utilized during procedure). Indications:    Stroke I63.9  History:        Patient has prior history of Echocardiogram examinations, most                 recent 12/10/2023.  Sonographer:    Tinnie Gosling RDCS Referring Phys: [id number] VASUNDHRA RATHORE IMPRESSIONS  1. Left ventricular ejection fraction, by estimation, is 40 to 45%. The left ventricle has mildly decreased function. The left ventricle demonstrates regional wall motion abnormalities. Abnormal (paradoxical) septal motion, consistent with left bundle branch block.  2. Right ventricular systolic function is mildly reduced. The right ventricular size is normal.  3. The mitral valve is normal in structure. Trivial mitral valve regurgitation. No evidence of mitral stenosis.  4. The aortic valve is tricuspid. Aortic valve regurgitation is not visualized. No aortic stenosis is present.  5. The inferior vena cava is normal in size with greater than 50% respiratory variability, suggesting right atrial pressure of 3 mmHg.  FINDINGS  Left Ventricle: Left ventricular ejection fraction, by estimation, is 40 to 45%. The left ventricle has mildly decreased function. The left ventricle demonstrates regional wall motion abnormalities. The left ventricular internal cavity size was normal in size. There is no left ventricular hypertrophy. Abnormal (paradoxical) septal motion, consistent with left bundle branch block. Left ventricular diastolic parameters are indeterminate. Right Ventricle: The right ventricular size is normal. No increase in right ventricular wall thickness. Right ventricular systolic function is mildly reduced. Left Atrium: Left atrial size was normal in size. Right Atrium: Right atrial size was normal in size. Pericardium: There is no evidence of pericardial effusion. Mitral Valve: The mitral valve is normal in structure. Trivial mitral valve regurgitation. No evidence of mitral valve stenosis. Tricuspid Valve: The tricuspid valve is normal in structure. Tricuspid valve regurgitation is trivial. Aortic Valve: The aortic valve is tricuspid. Aortic valve regurgitation is not visualized. No aortic stenosis is present. Pulmonic Valve: The pulmonic valve was not well visualized. Pulmonic valve regurgitation is not visualized. Aorta: The aortic root and ascending aorta are structurally normal, with no evidence of dilitation. Venous: The inferior vena cava is normal in size with greater than 50% respiratory variability, suggesting right atrial pressure of 3 mmHg. IAS/Shunts: The interatrial septum was not well visualized.  LEFT VENTRICLE PLAX 2D LVIDd:  3.50 cm   Diastology LVIDs:         2.50 cm   LV e' lateral: 7.18 cm/s LV PW:         0.90 cm LV IVS:        0.90 cm LVOT diam:     1.90 cm LV SV:         43 LV SV Index:   22 LVOT Area:     2.84 cm  RIGHT VENTRICLE RV S prime:     6.85 cm/s TAPSE (M-mode): 1.6 cm LEFT ATRIUM             Index        RIGHT ATRIUM          Index LA diam:        3.90 cm 2.00 cm/m   RA Area:      6.94 cm LA Vol (A2C):   56.2 ml 28.79 ml/m  RA Volume:   10.10 ml 5.17 ml/m LA Vol (A4C):   35.6 ml 18.23 ml/m LA Biplane Vol: 44.9 ml 23.00 ml/m  AORTIC VALVE LVOT Vmax:   84.60 cm/s LVOT Vmean:  60.000 cm/s LVOT VTI:    0.153 m  AORTA Ao Root diam: 2.90 cm Ao Asc diam:  3.20 cm  SHUNTS Systemic VTI:  0.15 m Systemic Diam: 1.90 cm Lonni Nanas MD Electronically signed by Lonni Nanas MD Signature Date/Time: 02/01/2024/2:09:31 PM    Final    CT ANGIO HEAD NECK W WO CM Result Date: 02/01/2024 CLINICAL DATA:  78 year old female with neurologic deficit, numbness. Small right thalamic lacunar infarct on MRI yesterday. EXAM: CT ANGIOGRAPHY HEAD AND NECK WITH AND WITHOUT CONTRAST TECHNIQUE: Multidetector CT imaging of the head and neck was performed using the standard protocol during bolus administration of intravenous contrast. Multiplanar CT image reconstructions and MIPs were obtained to evaluate the vascular anatomy. Carotid stenosis measurements (when applicable) are obtained utilizing NASCET criteria, using the distal internal carotid diameter as the denominator. RADIATION DOSE REDUCTION: This exam was performed according to the departmental dose-optimization program which includes automated exposure control, adjustment of the mA and/or kV according to patient size and/or use of iterative reconstruction technique. CONTRAST:  75mL OMNIPAQUE  IOHEXOL  350 MG/ML SOLN COMPARISON:  Brain MRI and head CT yesterday. CTA head and neck 09/03/2023. FINDINGS: CT HEAD Brain: Small right thalamic infarct identified on DWI is essentially occult by CT (series 4, image 14). No intracranial mass effect or acute intracranial hemorrhage. Stable non contrast CT appearance of the brain. Calvarium and skull base: Stable, intact. Paranasal sinuses: Visualized paranasal sinuses and mastoids are stable and well aerated. Orbits: Stable orbit and scalp soft tissues. CTA NECK Skeleton: Chronic cervical spine degeneration  appears stable since April. No acute osseous abnormality identified. Upper chest: Stable, negative. Other neck: Nonvascular neck soft tissue spaces appear stable since April and within normal limits. Aortic arch: Stable 3 vessel arch with no significant atherosclerosis. Right carotid system: Stable tortuosity with minimal plaque and no stenosis. Left carotid system: Stable tortuosity. Stable mild calcified plaque at the left carotid bifurcation. No stenosis. Vertebral arteries: Right vertebral origin remains normal. Minimal proximal right subclavian plaque. Cervical right vertebral artery is stable with tortuosity, no significant plaque or stenosis to the skull base. Proximal left subclavian artery and left vertebral origin are stable and within normal limits. Mildly dominant left vertebral artery is stable with tortuosity in the neck, no significant plaque or stenosis to the skull base. CTA HEAD Posterior  circulation: Distal vertebral arteries, vertebrobasilar junction, basilar arteries are stable and patent without stenosis. Dominant left V4. Normal PICA origins. Patent AICA and SCA origins. Patent PCA origins. Posterior communicating arteries are diminutive or absent. Bilateral PCAs remain patent but there is progressed and now moderate right P1/P2 junction PCA irregularity and stenosis (probable right thalamostriate artery region series 15, image 20). Comparatively mild left PCA irregularity is stable. Anterior circulation: Both ICA siphons are patent. Mild siphon calcified plaque, greater on the right, with no siphon stenosis. Patent carotid termini, MCA and ACA origins. Anterior communicating artery, bilateral ACA branches are within normal limits. Left MCA M1 bifurcates early without stenosis. Bilateral MCA branches are stable and within normal limits. Venous sinuses: Early contrast timing, not evaluated. Anatomic variants: Dominant left vertebral artery. Review of the MIP images confirms the above findings  IMPRESSION: 1. Negative for large vessel occlusion. Positive for progressed since April, moderate Right PCA irregularity and stenosis likely affecting the Right Thalamostriate Artery region. 2. Otherwise stable CTA Head and Neck. Mild for age atherosclerosis and no other significant stenosis. 3. Stable non contrast CT appearance of the brain. Electronically Signed   By: VEAR Hurst M.D.   On: 02/01/2024 10:14   MR BRAIN WO CONTRAST Result Date: 01/31/2024 EXAM: MRI BRAIN WITHOUT CONTRAST 01/31/2024 07:47:38 PM TECHNIQUE: Multiplanar multisequence MRI of the head/brain was performed without the administration of intravenous contrast. COMPARISON: CT head without contrast 01/31/2024. MRI head 09/03/2023. CLINICAL HISTORY: Left sided facial numbness and tingling in the left fingers, symptoms occurred about a month ago as well but resolved without hospital visit. Patient had a flu shot yesterday. FINDINGS: BRAIN AND VENTRICLES: A 6 mm acute nonhemorrhagic infarct is present in the lateral right thalamus with subtle T2 signal changes. Periventricular white matter changes are stable and essentially within normal limits for age. No intracranial hemorrhage. No mass. No midline shift. No hydrocephalus. The sella is unremarkable. Normal flow voids. ORBITS: No acute abnormality. SINUSES AND MASTOIDS: Left mastoid effusion is present. No obstructing nasopharyngeal lesion is present. BONES AND SOFT TISSUES: Normal marrow signal. No acute soft tissue abnormality. IMPRESSION: 1. 6 mm acute nonhemorrhagic infarct in the lateral right thalamus with subtle T2 signal changes. 2. Left mastoid effusion. No obstructing nasopharyngeal lesion. Electronically signed by: Lonni Necessary MD 01/31/2024 07:57 PM EDT RP Workstation: [id number]   CT Head Wo Contrast Result Date: 01/31/2024 CLINICAL DATA:  Initial evaluation for acute left-sided numbness. EXAM: CT HEAD WITHOUT CONTRAST TECHNIQUE: Contiguous axial images were obtained from the  base of the skull through the vertex without intravenous contrast. RADIATION DOSE REDUCTION: This exam was performed according to the departmental dose-optimization program which includes automated exposure control, adjustment of the mA and/or kV according to patient size and/or use of iterative reconstruction technique. COMPARISON:  Prior study from 10/31/2023. FINDINGS: Brain: Generalized age-related cerebral atrophy. Patchy hypodensity involving the supratentorial cerebral white matter, consistent with chronic small vessel ischemic disease. No acute intracranial hemorrhage. No acute large vessel territory infarct. No mass lesion or midline shift. No hydrocephalus or extra-axial fluid collection. Vascular: No abnormal hyperdense vessel. Calcified atherosclerosis present at the skull base. Skull: Scalp soft tissues within normal limits for a calvarium intact. Sinuses/Orbits: Globes and orbital soft tissues within normal limits. Paranasal sinuses are clear. No significant mastoid effusion. Other: None. IMPRESSION: 1. No acute intracranial abnormality. 2. Generalized age-related cerebral atrophy with chronic small vessel ischemic disease. Electronically Signed   By: Morene Hoard M.D.   On: 01/31/2024 19:27  Time coordinating discharge: 60 mins  SIGNED:  Camellia Door, DO [facility] 02/01/24, 2:40 PM

## 2024-02-01 NOTE — ED Notes (Signed)
 The pt reports that her face lt sided is numb and all the fingers are numb on her lt hand

## 2024-02-01 NOTE — Assessment & Plan Note (Addendum)
 02-01-2024 cont Toprol -XL.

## 2024-02-01 NOTE — Assessment & Plan Note (Addendum)
 02-01-2024 on Eliquis , Toprol -XL.

## 2024-02-01 NOTE — Assessment & Plan Note (Signed)
Body mass index is 30.51 kg/m.

## 2024-02-01 NOTE — H&P (Incomplete)
 History and Physical    Melissa Sosa FMW:[id number] DOB: Feb 17, 1946 DOA: 01/31/2024  PCP: Cristopher Bottcher, NP  Patient coming from: Home  Chief Complaint: Left-sided facial numbness  HPI: Melissa Sosa is a 78 y.o. female with medical history significant of breast cancer status post lumpectomy and radiation in remission, cervical cancer status post hysterectomy, non-Hodgkin's lymphoma status post chemotherapy, HFmrEF, PAF on Eliquis , LBBB, hypertension, polymyalgia rheumatica, anxiety/depression, class I obesity, osteoarthritis, seizure disorder.  Hospital admission last month 8/5-8/7 for CHF exacerbation and A-fib with RVR.  Patient presents to the ED today for evaluation of left-sided facial numbness and tingling of the fingertips of her left hand.    ED Course: Vital signs stable. Labs notable for hemoglobin 15.7, glucose 116, troponin negative x 2, UA not suggestive of infection. CT head showing no acute intracranial abnormality. Brain MRI showing a 6 mm acute nonhemorrhagic infarct in the lateral right thalamus with subtle T2 signal changes. Showing left mastoid effusion and no obstructing nasopharyngeal lesion. Patient was given Ativan . Neurology consulted.   Review of Systems:  ROS  Past Medical History:  Diagnosis Date  . Anxiety   . Breast cancer (HCC)   . Hypertension   . PMR (polymyalgia rheumatica) (HCC)   . Pneumonia     No past surgical history on file.   reports that she has never smoked. She has never used smokeless tobacco. She reports that she does not drink alcohol and does not use drugs.  Allergies  Allergen Reactions  . Sulfamethoxazole-Trimethoprim Hives and Rash  . Venlafaxine Hives, Itching and Other (See Comments)    Headache  . Imitrex [Sumatriptan]     hypertension  . Morphine And Codeine Other (See Comments)    Hyper, confusion     No family history on file.  Prior to Admission medications   Medication Sig Start Date End Date Taking? Authorizing  Provider  acetaminophen  (TYLENOL ) 500 MG tablet Take 2 tablets (1,000 mg total) by mouth every 6 (six) hours as needed. Patient taking differently: Take 500-1,000 mg by mouth every 6 (six) hours as needed. 09/07/23  Yes Diona Perkins, MD  apixaban  (ELIQUIS ) 5 MG TABS tablet Take 1 tablet (5 mg total) by mouth 2 (two) times daily. 01/15/24 04/14/24 Yes Colletta Manuelita Garre, PA-C  dapagliflozin  propanediol (FARXIGA ) 10 MG TABS tablet Take 1 tablet (10 mg total) by mouth daily. Patient taking differently: Take 10 mg by mouth at bedtime. 01/15/24  Yes Colletta Manuelita Garre, PA-C  levETIRAcetam  (KEPPRA ) 750 MG tablet Take 1 tablet (750 mg total) by mouth 2 (two) times daily. 10/11/23 01/30/25 Yes Camara, Amadou, MD  metoprolol  succinate (TOPROL -XL) 50 MG 24 hr tablet Take 1 tablet (50 mg total) by mouth daily. Take with or immediately following a meal. 12/30/23  Yes Colletta Manuelita Garre, PA-C  Multiple Vitamin (MULTIVITAMIN) tablet Take 1 tablet by mouth daily.   Yes [provider]  nortriptyline  (PAMELOR ) 25 MG capsule Take 25 mg by mouth at bedtime.   Yes [provider]  nystatin (MYCOSTATIN/NYSTOP) powder Apply 1 Application topically See admin instructions. Application Topical 2-3 Times Daily 01/28/24  Yes [provider]  sacubitril -valsartan  (ENTRESTO ) 24-26 MG Take 1 tablet by mouth 2 (two) times daily. 01/16/24  Yes Colletta Manuelita Garre, PA-C  furosemide  (LASIX ) 40 MG tablet Take one tablet  (40 mg) for weight gain greater than 3 pounds in 24 hours or 5 pounds in a week. Patient not taking: Reported on 01/31/2024 12/30/23   Colletta Manuelita Garre, PA-C  Physical Exam: Vitals:   01/31/24 2015 01/31/24 2030 01/31/24 2100 01/31/24 2103  BP: 133/78 (!) 142/71 (!) 140/71   Pulse: 82 79 79   Resp: 12 15 18    Temp:    97.6 F (36.4 C)  TempSrc:    Oral  SpO2: 100% 100% 100%   Weight:      Height:        Physical Exam  Labs on Admission: I have personally reviewed  following labs and imaging studies  CBC: Recent Labs  Lab 01/31/24 1730 01/31/24 1812  WBC 5.5  --   NEUTROABS 3.1  --   HGB 15.7* 16.3*  HCT 49.4* 48.0*  MCV 89.5  --   PLT 226  --    Basic Metabolic Panel: Recent Labs  Lab 01/31/24 1730 01/31/24 1812  NA 139 141  K 4.3 4.4  CL 106 107  CO2 23  --   GLUCOSE 116* 122*  BUN 15 17  CREATININE 0.89 0.90  CALCIUM  9.7  --   MG 2.0  --    GFR: Estimated Creatinine Clearance: 57.8 mL/min (by C-G formula based on SCr of 0.9 mg/dL). Liver Function Tests: No results for input(s): AST, ALT, ALKPHOS, BILITOT, PROT, ALBUMIN in the last 168 hours. No results for input(s): LIPASE, AMYLASE in the last 168 hours. No results for input(s): AMMONIA in the last 168 hours. Coagulation Profile: No results for input(s): INR, PROTIME in the last 168 hours. Cardiac Enzymes: No results for input(s): CKTOTAL, CKMB, CKMBINDEX, TROPONINI in the last 168 hours. BNP (last 3 results) No results for input(s): PROBNP in the last 8760 hours. HbA1C: No results for input(s): HGBA1C in the last 72 hours. CBG: No results for input(s): GLUCAP in the last 168 hours. Lipid Profile: No results for input(s): CHOL, HDL, LDLCALC, TRIG, CHOLHDL, LDLDIRECT in the last 72 hours. Thyroid  Function Tests: No results for input(s): TSH, T4TOTAL, FREET4, T3FREE, THYROIDAB in the last 72 hours. Anemia Panel: No results for input(s): VITAMINB12, FOLATE, FERRITIN, TIBC, IRON, RETICCTPCT in the last 72 hours. Urine analysis:    Component Value Date/Time   COLORURINE YELLOW 01/31/2024 1920   APPEARANCEUR HAZY (A) 01/31/2024 1920   LABSPEC 1.010 01/31/2024 1920   PHURINE 6.0 01/31/2024 1920   GLUCOSEU >=500 (A) 01/31/2024 1920   HGBUR NEGATIVE 01/31/2024 1920   BILIRUBINUR NEGATIVE 01/31/2024 1920   KETONESUR NEGATIVE 01/31/2024 1920   PROTEINUR NEGATIVE 01/31/2024 1920   NITRITE NEGATIVE  01/31/2024 1920   LEUKOCYTESUR NEGATIVE 01/31/2024 1920    Radiological Exams on Admission: MR BRAIN WO CONTRAST Result Date: 01/31/2024 EXAM: MRI BRAIN WITHOUT CONTRAST 01/31/2024 07:47:38 PM TECHNIQUE: Multiplanar multisequence MRI of the head/brain was performed without the administration of intravenous contrast. COMPARISON: CT head without contrast 01/31/2024. MRI head 09/03/2023. CLINICAL HISTORY: Left sided facial numbness and tingling in the left fingers, symptoms occurred about a month ago as well but resolved without hospital visit. Patient had a flu shot yesterday. FINDINGS: BRAIN AND VENTRICLES: A 6 mm acute nonhemorrhagic infarct is present in the lateral right thalamus with subtle T2 signal changes. Periventricular white matter changes are stable and essentially within normal limits for age. No intracranial hemorrhage. No mass. No midline shift. No hydrocephalus. The sella is unremarkable. Normal flow voids. ORBITS: No acute abnormality. SINUSES AND MASTOIDS: Left mastoid effusion is present. No obstructing nasopharyngeal lesion is present. BONES AND SOFT TISSUES: Normal marrow signal. No acute soft tissue abnormality. IMPRESSION: 1. 6 mm acute nonhemorrhagic infarct in the lateral right  thalamus with subtle T2 signal changes. 2. Left mastoid effusion. No obstructing nasopharyngeal lesion. Electronically signed by: Lonni Necessary MD 01/31/2024 07:57 PM EDT RP Workstation: [id number]   CT Head Wo Contrast Result Date: 01/31/2024 CLINICAL DATA:  Initial evaluation for acute left-sided numbness. EXAM: CT HEAD WITHOUT CONTRAST TECHNIQUE: Contiguous axial images were obtained from the base of the skull through the vertex without intravenous contrast. RADIATION DOSE REDUCTION: This exam was performed according to the departmental dose-optimization program which includes automated exposure control, adjustment of the mA and/or kV according to patient size and/or use of iterative reconstruction  technique. COMPARISON:  Prior study from 10/31/2023. FINDINGS: Brain: Generalized age-related cerebral atrophy. Patchy hypodensity involving the supratentorial cerebral white matter, consistent with chronic small vessel ischemic disease. No acute intracranial hemorrhage. No acute large vessel territory infarct. No mass lesion or midline shift. No hydrocephalus or extra-axial fluid collection. Vascular: No abnormal hyperdense vessel. Calcified atherosclerosis present at the skull base. Skull: Scalp soft tissues within normal limits for a calvarium intact. Sinuses/Orbits: Globes and orbital soft tissues within normal limits. Paranasal sinuses are clear. No significant mastoid effusion. Other: None. IMPRESSION: 1. No acute intracranial abnormality. 2. Generalized age-related cerebral atrophy with chronic small vessel ischemic disease. Electronically Signed   By: Morene Hoard M.D.   On: 01/31/2024 19:27    EKG: Independently reviewed. Sinus rhythm, PVCs, LBBB is not new.   Assessment and Plan  Acute nonhemorrhagic right thalamic infarct -Neurology consulted -Patient is already on Eliquis .  Antithrombotic therapy per neurology. -Telemetry monitoring -TTE -Hemoglobin A1c, fasting lipid panel -Aspirin  325 p.o. now and daily -Atorvastatin 80 mg now and daily -Frequent neurochecks -PT, OT, speech therapy. -N.p.o. until cleared by bedside swallow evaluation or formal speech evaluation   Hypertension Hold home antihypertensives and allow permissive hypertension at this time.  Chronic HFmrEF TTE done 12/10/2023 showing EF 40 to 45%, mild mitral regurgitation, and mild to moderate tricuspid regurgitation.  No signs of volume overload at this time.  Repeat echo ordered given acute stroke.  Paroxysmal A-fib Currently in sinus rhythm.  Continue Eliquis .  Polymyalgia rheumatica  Anxiety/depression  Seizure disorder     DVT prophylaxis: {Blank single:19197::Lovenox ,SQ Heparin ,IV  heparin  gtt,Xarelto,Eliquis ,Coumadin,SCDs,***} Code Status: {Blank single:19197::Full Code,DNR,DNR/DNI,Comfort Care,***} Family Communication: ***  Consults called: ***  Level of care: {Blank single:19197::Med-Surg,Telemetry bed,Progressive Care Unit,Step Down Unit} Admission status: *** Time Spent: 75+ minutes***  Editha Ram MD [facility]  If 7PM-7AM, please contact night-coverage [email or web address]  01/31/2024, 11:35 PM

## 2024-02-01 NOTE — Progress Notes (Addendum)
 PROGRESS NOTE    Melissa Sosa  FMW:[id number] DOB: Jul 11, 1945 DOA: 01/31/2024 PCP: Cristopher Bottcher, NP  Subjective: Pt seen and examined. Met with pt and dtr leah at bedside. Neurology has given discharge clearance. Only added asa 81 mg and crestor  20 mg to therapy. Continue with eliquis . Echo shows LVEF 40-45%. Stable. No thrombus. Stable for DC.  Pt has been seen by PT and OT. No needs identified.   Hospital Course: CC: left facial numbness HPI: Melissa Sosa is a 78 y.o. female with medical history significant of breast cancer status post lumpectomy and radiation in remission, cervical cancer status post hysterectomy, non-Hodgkin's lymphoma status post chemotherapy, HFmrEF, PAF on Eliquis , LBBB, hypertension, polymyalgia rheumatica, anxiety/depression, class I obesity, osteoarthritis, seizure disorder.  Hospital admission last month 8/5-8/7 for CHF exacerbation and A-fib with RVR.  Patient presents to the ED today for evaluation of left-sided facial numbness and numbness/tingling of the fingertips of her left hand since 3 PM today.  Denies experiencing any changes in vision, difficulty with speech, or weakness of arm or leg.  Denies history of previous stroke.  She is compliant with her home Eliquis .  No other complaints.  Denies cough, shortness of breath, chest pain, vomiting, or abdominal pain.   ED Course: Vital signs stable. Labs notable for hemoglobin 15.7, glucose 116, troponin negative x 2, UA not suggestive of infection. CT head showing no acute intracranial abnormality. Brain MRI showing a 6 mm acute nonhemorrhagic infarct in the lateral right thalamus with subtle T2 signal changes. Showing left mastoid effusion and no obstructing nasopharyngeal lesion. Patient was given Ativan  and Keppra . Neurology consulted.   Significant Events: Admitted 01/31/2024 acute CVA of right thalamus   Admission Labs: WBC 5.5, HgB 15.7, plt 226 Mg 2.0 Na 139, K 4.3, CO2 of 23, BUN 15, Scr 0.89, glu  116 UA glu >500, otherwise negative A1C of 6.0% T. Chol 160, HDL 52, LDL 86, TG 112  Admission Imaging Studies: CT head No acute intracranial abnormality. 2. Generalized age-related cerebral atrophy with chronic small vessel ischemic disease  Significant Labs:   Significant Imaging Studies: MRI brain 6 mm acute nonhemorrhagic infarct in the lateral right thalamus with subtle T2 signal changes. 2. Left mastoid effusion. No obstructing nasopharyngeal lesion. CTA head/neck Negative for large vessel occlusion. Positive for progressed since April, moderate Right PCA irregularity and stenosis likely affecting the Right Thalamostriate Artery region. 2. Otherwise stable CTA Head and Neck. Mild for age atherosclerosis and no other significant stenosis. 3. Stable non contrast CT appearance of the brain. Echo LVEF 40-45%. No intra-atrial shunt.  Antibiotic Therapy: Anti-infectives (From admission, onward)    None       Procedures:   Consultants: neurology    Assessment and Plan: * Acute CVA (cerebrovascular accident) (HCC) 02-01-2024 MRI brain showed small 6 mm CVA. Pt with only some residual left sided facial numbness and left finger numbness. No PT/OT needs identified.  Neurology has given discharge clearance. Only added asa 81 mg and crestor  20 mg to therapy. Continue with eliquis . Echo shows LVEF 40-45%. Stable. No thrombus. Stable for DC.  Lipid Panel: Lab Results  Component Value Date/Time   CHOL 160 02/01/2024 01:55 AM   TRIG 112 02/01/2024 01:55 AM   HDL 52 02/01/2024 01:55 AM   CHOLHDL 3.1 02/01/2024 01:55 AM   VLDL 22 02/01/2024 01:55 AM   LDLCALC 86 02/01/2024 01:55 AM       Obesity, Class I, BMI 30-34.9 Body mass index is 30.51 kg/m.  Paroxysmal atrial fibrillation (HCC) 02-01-2024 on Eliquis , Toprol -XL.  Chronic systolic CHF (congestive heart failure) (HCC) - LVEF 40-45% 02-01-2024 on Toprol -XL and entresto . F/u with outpatient cards.  Seizure disorder  (HCC) 02-01-2024 continue keppra  750 mg bid. F/u with outpatient neurology.  Essential hypertension 02-01-2024 cont Toprol -XL.  DVT prophylaxis:  apixaban  (ELIQUIS ) tablet 5 mg     Code Status: Full Code Family Communication: discussed with pt and dtr leah at bedside Disposition Plan: return home Reason for continuing need for hospitalization: stable for DC home.  Objective: Vitals:   02/01/24 1300 02/01/24 1315 02/01/24 1326 02/01/24 1402  BP:  106/65  107/75  Pulse: 98 93  93  Resp:    20  Temp:   98.1 F (36.7 C)   TempSrc:      SpO2: 99% 100%  100%  Weight:      Height:       No intake or output data in the 24 hours ending 02/01/24 1438 Filed Weights   01/31/24 1651  Weight: 85.7 kg    Examination:  Physical Exam Vitals and nursing note reviewed.  Constitutional:      General: She is not in acute distress.    Appearance: She is not toxic-appearing or diaphoretic.  HENT:     Head: Normocephalic and atraumatic.     Nose: Nose normal.  Eyes:     General: No scleral icterus. Cardiovascular:     Rate and Rhythm: Normal rate and regular rhythm.  Pulmonary:     Effort: Pulmonary effort is normal. No respiratory distress.     Breath sounds: Normal breath sounds. No wheezing.  Abdominal:     General: Bowel sounds are normal. There is no distension.     Palpations: Abdomen is soft.     Tenderness: There is no abdominal tenderness.  Musculoskeletal:     Right lower leg: No edema.     Left lower leg: No edema.  Skin:    General: Skin is warm and dry.     Capillary Refill: Capillary refill takes less than 2 seconds.  Neurological:     General: No focal deficit present.     Mental Status: She is alert and oriented to person, place, and time.   Data Reviewed: I have personally reviewed following labs and imaging studies  CBC: Recent Labs  Lab 01/31/24 1730 01/31/24 1812  WBC 5.5  --   NEUTROABS 3.1  --   HGB 15.7* 16.3*  HCT 49.4* 48.0*  MCV 89.5  --    PLT 226  --    Basic Metabolic Panel: Recent Labs  Lab 01/31/24 1730 01/31/24 1812  NA 139 141  K 4.3 4.4  CL 106 107  CO2 23  --   GLUCOSE 116* 122*  BUN 15 17  CREATININE 0.89 0.90  CALCIUM  9.7  --   MG 2.0  --    GFR: Estimated Creatinine Clearance: 57.8 mL/min (by C-G formula based on SCr of 0.9 mg/dL). BNP (last 3 results) Recent Labs    12/09/23 1736 12/24/23 1505 12/30/23 0937  BNP 686.2* 1,036.5* 522.1*   HbA1C: Recent Labs    02/01/24 0155  HGBA1C 6.0*   Lipid Profile: Recent Labs    02/01/24 0155  CHOL 160  HDL 52  LDLCALC 86  TRIG 112  CHOLHDL 3.1   Radiology Studies: ECHOCARDIOGRAM COMPLETE Result Date: 02/01/2024    ECHOCARDIOGRAM REPORT   Patient Name:   MAZI SCHUFF Date of Exam: 02/01/2024 Medical Rec #:  [id number]     Height:       66.0 in Accession #:    [id number]    Weight:       189.0 lb Date of Birth:  Jun 03, 1945      BSA:          1.952 m Patient Age:    78 years      BP:           106/65 mmHg Patient Gender: F             HR:           93 bpm. Exam Location:  Inpatient Procedure: 2D Echo, Color Doppler and Cardiac Doppler (Both Spectral and Color            Flow Doppler were utilized during procedure). Indications:    Stroke I63.9  History:        Patient has prior history of Echocardiogram examinations, most                 recent 12/10/2023.  Sonographer:    Tinnie Gosling RDCS Referring Phys: [id number] VASUNDHRA RATHORE IMPRESSIONS  1. Left ventricular ejection fraction, by estimation, is 40 to 45%. The left ventricle has mildly decreased function. The left ventricle demonstrates regional wall motion abnormalities. Abnormal (paradoxical) septal motion, consistent with left bundle branch block.  2. Right ventricular systolic function is mildly reduced. The right ventricular size is normal.  3. The mitral valve is normal in structure. Trivial mitral valve regurgitation. No evidence of mitral stenosis.  4. The aortic valve is tricuspid. Aortic valve  regurgitation is not visualized. No aortic stenosis is present.  5. The inferior vena cava is normal in size with greater than 50% respiratory variability, suggesting right atrial pressure of 3 mmHg. FINDINGS  Left Ventricle: Left ventricular ejection fraction, by estimation, is 40 to 45%. The left ventricle has mildly decreased function. The left ventricle demonstrates regional wall motion abnormalities. The left ventricular internal cavity size was normal in size. There is no left ventricular hypertrophy. Abnormal (paradoxical) septal motion, consistent with left bundle branch block. Left ventricular diastolic parameters are indeterminate. Right Ventricle: The right ventricular size is normal. No increase in right ventricular wall thickness. Right ventricular systolic function is mildly reduced. Left Atrium: Left atrial size was normal in size. Right Atrium: Right atrial size was normal in size. Pericardium: There is no evidence of pericardial effusion. Mitral Valve: The mitral valve is normal in structure. Trivial mitral valve regurgitation. No evidence of mitral valve stenosis. Tricuspid Valve: The tricuspid valve is normal in structure. Tricuspid valve regurgitation is trivial. Aortic Valve: The aortic valve is tricuspid. Aortic valve regurgitation is not visualized. No aortic stenosis is present. Pulmonic Valve: The pulmonic valve was not well visualized. Pulmonic valve regurgitation is not visualized. Aorta: The aortic root and ascending aorta are structurally normal, with no evidence of dilitation. Venous: The inferior vena cava is normal in size with greater than 50% respiratory variability, suggesting right atrial pressure of 3 mmHg. IAS/Shunts: The interatrial septum was not well visualized.  LEFT VENTRICLE PLAX 2D LVIDd:         3.50 cm   Diastology LVIDs:         2.50 cm   LV e' lateral: 7.18 cm/s LV PW:         0.90 cm LV IVS:        0.90 cm LVOT diam:     1.90 cm LV SV:  43 LV SV Index:   22  LVOT Area:     2.84 cm  RIGHT VENTRICLE RV S prime:     6.85 cm/s TAPSE (M-mode): 1.6 cm LEFT ATRIUM             Index        RIGHT ATRIUM          Index LA diam:        3.90 cm 2.00 cm/m   RA Area:     6.94 cm LA Vol (A2C):   56.2 ml 28.79 ml/m  RA Volume:   10.10 ml 5.17 ml/m LA Vol (A4C):   35.6 ml 18.23 ml/m LA Biplane Vol: 44.9 ml 23.00 ml/m  AORTIC VALVE LVOT Vmax:   84.60 cm/s LVOT Vmean:  60.000 cm/s LVOT VTI:    0.153 m  AORTA Ao Root diam: 2.90 cm Ao Asc diam:  3.20 cm  SHUNTS Systemic VTI:  0.15 m Systemic Diam: 1.90 cm Lonni Nanas MD Electronically signed by Lonni Nanas MD Signature Date/Time: 02/01/2024/2:09:31 PM    Final    CT ANGIO HEAD NECK W WO CM Result Date: 02/01/2024 CLINICAL DATA:  78 year old female with neurologic deficit, numbness. Small right thalamic lacunar infarct on MRI yesterday. EXAM: CT ANGIOGRAPHY HEAD AND NECK WITH AND WITHOUT CONTRAST TECHNIQUE: Multidetector CT imaging of the head and neck was performed using the standard protocol during bolus administration of intravenous contrast. Multiplanar CT image reconstructions and MIPs were obtained to evaluate the vascular anatomy. Carotid stenosis measurements (when applicable) are obtained utilizing NASCET criteria, using the distal internal carotid diameter as the denominator. RADIATION DOSE REDUCTION: This exam was performed according to the departmental dose-optimization program which includes automated exposure control, adjustment of the mA and/or kV according to patient size and/or use of iterative reconstruction technique. CONTRAST:  75mL OMNIPAQUE  IOHEXOL  350 MG/ML SOLN COMPARISON:  Brain MRI and head CT yesterday. CTA head and neck 09/03/2023. FINDINGS: CT HEAD Brain: Small right thalamic infarct identified on DWI is essentially occult by CT (series 4, image 14). No intracranial mass effect or acute intracranial hemorrhage. Stable non contrast CT appearance of the brain. Calvarium and skull base:  Stable, intact. Paranasal sinuses: Visualized paranasal sinuses and mastoids are stable and well aerated. Orbits: Stable orbit and scalp soft tissues. CTA NECK Skeleton: Chronic cervical spine degeneration appears stable since April. No acute osseous abnormality identified. Upper chest: Stable, negative. Other neck: Nonvascular neck soft tissue spaces appear stable since April and within normal limits. Aortic arch: Stable 3 vessel arch with no significant atherosclerosis. Right carotid system: Stable tortuosity with minimal plaque and no stenosis. Left carotid system: Stable tortuosity. Stable mild calcified plaque at the left carotid bifurcation. No stenosis. Vertebral arteries: Right vertebral origin remains normal. Minimal proximal right subclavian plaque. Cervical right vertebral artery is stable with tortuosity, no significant plaque or stenosis to the skull base. Proximal left subclavian artery and left vertebral origin are stable and within normal limits. Mildly dominant left vertebral artery is stable with tortuosity in the neck, no significant plaque or stenosis to the skull base. CTA HEAD Posterior circulation: Distal vertebral arteries, vertebrobasilar junction, basilar arteries are stable and patent without stenosis. Dominant left V4. Normal PICA origins. Patent AICA and SCA origins. Patent PCA origins. Posterior communicating arteries are diminutive or absent. Bilateral PCAs remain patent but there is progressed and now moderate right P1/P2 junction PCA irregularity and stenosis (probable right thalamostriate artery region series 15, image 20). Comparatively mild  left PCA irregularity is stable. Anterior circulation: Both ICA siphons are patent. Mild siphon calcified plaque, greater on the right, with no siphon stenosis. Patent carotid termini, MCA and ACA origins. Anterior communicating artery, bilateral ACA branches are within normal limits. Left MCA M1 bifurcates early without stenosis. Bilateral MCA  branches are stable and within normal limits. Venous sinuses: Early contrast timing, not evaluated. Anatomic variants: Dominant left vertebral artery. Review of the MIP images confirms the above findings IMPRESSION: 1. Negative for large vessel occlusion. Positive for progressed since April, moderate Right PCA irregularity and stenosis likely affecting the Right Thalamostriate Artery region. 2. Otherwise stable CTA Head and Neck. Mild for age atherosclerosis and no other significant stenosis. 3. Stable non contrast CT appearance of the brain. Electronically Signed   By: VEAR Hurst M.D.   On: 02/01/2024 10:14   MR BRAIN WO CONTRAST Result Date: 01/31/2024 EXAM: MRI BRAIN WITHOUT CONTRAST 01/31/2024 07:47:38 PM TECHNIQUE: Multiplanar multisequence MRI of the head/brain was performed without the administration of intravenous contrast. COMPARISON: CT head without contrast 01/31/2024. MRI head 09/03/2023. CLINICAL HISTORY: Left sided facial numbness and tingling in the left fingers, symptoms occurred about a month ago as well but resolved without hospital visit. Patient had a flu shot yesterday. FINDINGS: BRAIN AND VENTRICLES: A 6 mm acute nonhemorrhagic infarct is present in the lateral right thalamus with subtle T2 signal changes. Periventricular white matter changes are stable and essentially within normal limits for age. No intracranial hemorrhage. No mass. No midline shift. No hydrocephalus. The sella is unremarkable. Normal flow voids. ORBITS: No acute abnormality. SINUSES AND MASTOIDS: Left mastoid effusion is present. No obstructing nasopharyngeal lesion is present. BONES AND SOFT TISSUES: Normal marrow signal. No acute soft tissue abnormality. IMPRESSION: 1. 6 mm acute nonhemorrhagic infarct in the lateral right thalamus with subtle T2 signal changes. 2. Left mastoid effusion. No obstructing nasopharyngeal lesion. Electronically signed by: Lonni Necessary MD 01/31/2024 07:57 PM EDT RP Workstation: [id number]    CT Head Wo Contrast Result Date: 01/31/2024 CLINICAL DATA:  Initial evaluation for acute left-sided numbness. EXAM: CT HEAD WITHOUT CONTRAST TECHNIQUE: Contiguous axial images were obtained from the base of the skull through the vertex without intravenous contrast. RADIATION DOSE REDUCTION: This exam was performed according to the departmental dose-optimization program which includes automated exposure control, adjustment of the mA and/or kV according to patient size and/or use of iterative reconstruction technique. COMPARISON:  Prior study from 10/31/2023. FINDINGS: Brain: Generalized age-related cerebral atrophy. Patchy hypodensity involving the supratentorial cerebral white matter, consistent with chronic small vessel ischemic disease. No acute intracranial hemorrhage. No acute large vessel territory infarct. No mass lesion or midline shift. No hydrocephalus or extra-axial fluid collection. Vascular: No abnormal hyperdense vessel. Calcified atherosclerosis present at the skull base. Skull: Scalp soft tissues within normal limits for a calvarium intact. Sinuses/Orbits: Globes and orbital soft tissues within normal limits. Paranasal sinuses are clear. No significant mastoid effusion. Other: None. IMPRESSION: 1. No acute intracranial abnormality. 2. Generalized age-related cerebral atrophy with chronic small vessel ischemic disease. Electronically Signed   By: Morene Hoard M.D.   On: 01/31/2024 19:27    Scheduled Meds:  [START ON 02/02/2024]  stroke: early stages of recovery book   Does not apply Once   apixaban   5 mg Oral BID   aspirin  EC  81 mg Oral Daily   dapagliflozin  propanediol  10 mg Oral QHS   levETIRAcetam   750 mg Oral BID   nortriptyline   25 mg Oral QHS  rosuvastatin   20 mg Oral Daily   Continuous Infusions:   LOS: 0 days   Time spent: 60 minutes  Camellia Door, DO  [facility]  02/01/2024, 2:38 PM

## 2024-02-01 NOTE — Care Management Obs Status (Signed)
 MEDICARE OBSERVATION STATUS NOTIFICATION   Patient Details  Name: Melissa Sosa MRN: [id number] Date of Birth: 1945/08/15   Medicare Observation Status Notification Given:  Yes    Jon Cruel 02/01/2024, 1:50 PM

## 2024-02-01 NOTE — Progress Notes (Signed)
  Echocardiogram 2D Echocardiogram has been performed.  Tinnie FORBES Gosling RDCS 02/01/2024, 1:48 PM

## 2024-02-01 NOTE — Progress Notes (Addendum)
 STROKE TEAM PROGRESS NOTE    SIGNIFICANT HOSPITAL EVENTS  9/12: Presented d/t left sided face, forehead and jaw numbness, tingling to left fingers  MRI: 6 mm acute nonhemorrhagic infarct in the lateral right thalamus with subtle T2 signal changes   INTERIM HISTORY/SUBJECTIVE  On exam, patient continues to have slight left facial droop and endorse numbness to left mid and lower face and left thumb and ring/pinky finger. No focal deficits, dysarthria or aphasia.   We will add Aspirin  to her Eliquis  for additional secondary stroke prevention.  Pending echo.   OBJECTIVE  CBC    Component Value Date/Time   WBC 5.5 01/31/2024 1730   RBC 5.52 (H) 01/31/2024 1730   HGB 16.3 (H) 01/31/2024 1812   HCT 48.0 (H) 01/31/2024 1812   PLT 226 01/31/2024 1730   MCV 89.5 01/31/2024 1730   MCH 28.4 01/31/2024 1730   MCHC 31.8 01/31/2024 1730   RDW 13.1 01/31/2024 1730   LYMPHSABS 1.6 01/31/2024 1730   MONOABS 0.6 01/31/2024 1730   EOSABS 0.1 01/31/2024 1730   BASOSABS 0.1 01/31/2024 1730    BMET    Component Value Date/Time   NA 141 01/31/2024 1812   K 4.4 01/31/2024 1812   CL 107 01/31/2024 1812   CO2 23 01/31/2024 1730   GLUCOSE 122 (H) 01/31/2024 1812   BUN 17 01/31/2024 1812   CREATININE 0.90 01/31/2024 1812   CALCIUM  9.7 01/31/2024 1730   GFRNONAA >60 01/31/2024 1730    IMAGING past 24 hours MR BRAIN WO CONTRAST Result Date: 01/31/2024 EXAM: MRI BRAIN WITHOUT CONTRAST 01/31/2024 07:47:38 PM TECHNIQUE: Multiplanar multisequence MRI of the head/brain was performed without the administration of intravenous contrast. COMPARISON: CT head without contrast 01/31/2024. MRI head 09/03/2023. CLINICAL HISTORY: Left sided facial numbness and tingling in the left fingers, symptoms occurred about a month ago as well but resolved without hospital visit. Patient had a flu shot yesterday. FINDINGS: BRAIN AND VENTRICLES: A 6 mm acute nonhemorrhagic infarct is present in the lateral right thalamus  with subtle T2 signal changes. Periventricular white matter changes are stable and essentially within normal limits for age. No intracranial hemorrhage. No mass. No midline shift. No hydrocephalus. The sella is unremarkable. Normal flow voids. ORBITS: No acute abnormality. SINUSES AND MASTOIDS: Left mastoid effusion is present. No obstructing nasopharyngeal lesion is present. BONES AND SOFT TISSUES: Normal marrow signal. No acute soft tissue abnormality. IMPRESSION: 1. 6 mm acute nonhemorrhagic infarct in the lateral right thalamus with subtle T2 signal changes. 2. Left mastoid effusion. No obstructing nasopharyngeal lesion. Electronically signed by: Lonni Necessary MD 01/31/2024 07:57 PM EDT RP Workstation: [id number]   CT Head Wo Contrast Result Date: 01/31/2024 CLINICAL DATA:  Initial evaluation for acute left-sided numbness. EXAM: CT HEAD WITHOUT CONTRAST TECHNIQUE: Contiguous axial images were obtained from the base of the skull through the vertex without intravenous contrast. RADIATION DOSE REDUCTION: This exam was performed according to the departmental dose-optimization program which includes automated exposure control, adjustment of the mA and/or kV according to patient size and/or use of iterative reconstruction technique. COMPARISON:  Prior study from 10/31/2023. FINDINGS: Brain: Generalized age-related cerebral atrophy. Patchy hypodensity involving the supratentorial cerebral white matter, consistent with chronic small vessel ischemic disease. No acute intracranial hemorrhage. No acute large vessel territory infarct. No mass lesion or midline shift. No hydrocephalus or extra-axial fluid collection. Vascular: No abnormal hyperdense vessel. Calcified atherosclerosis present at the skull base. Skull: Scalp soft tissues within normal limits for a calvarium intact. Sinuses/Orbits: Globes and  orbital soft tissues within normal limits. Paranasal sinuses are clear. No significant mastoid effusion. Other:  None. IMPRESSION: 1. No acute intracranial abnormality. 2. Generalized age-related cerebral atrophy with chronic small vessel ischemic disease. Electronically Signed   By: Morene Hoard M.D.   On: 01/31/2024 19:27    Vitals:   02/01/24 0545 02/01/24 0600 02/01/24 0800 02/01/24 0836  BP:    131/70  Pulse: 78 81 83 93  Resp: 16 16 15 19   Temp:    97.9 F (36.6 C)  TempSrc:    Oral  SpO2: 99% 98% 99% 100%  Weight:      Height:         PHYSICAL EXAM General:  Alert, well-nourished, well-developed patient in no acute distress Psych:  Mood and affect appropriate for situation CV: Regular rate and rhythm on monitor Respiratory:  Regular, unlabored respirations on room air GI: Abdomen soft and nontender   NEURO:  Mental Status: AA&Ox3, patient is able to give clear and coherent history Speech/Language: speech is without dysarthria or aphasia.  Naming, repetition, fluency, and comprehension intact.  Cranial Nerves:  II: PERRL. Visual fields full.  III, IV, VI: EOMI. Eyelids elevate symmetrically.  V: Sensation is reduced to left mid and lower face. VII: Slight left facial droop VIII: hearing intact to voice. IX, X: Palate elevates symmetrically. Phonation is normal.  KP:Dynloizm shrug 5/5. XII: tongue is midline without fasciculations. Motor: 5/5 strength to all muscle groups tested.  Tone: is normal and bulk is normal Sensation- Reduced to left thumb, ring and pinky fingertips.  Coordination: FTN intact bilaterally, HKS: no ataxia in BLE.No drift.  Gait- deferred  Most Recent NIH: 2    ASSESSMENT/PLAN  Melissa Sosa is a 78 y.o. female with history of  atrial fibrillation (on Eliquis ), seizure, anxiety, breast cancer s/p lumpectomy and radiation therapy, cervical cancer status post hysterectomy, non-Hodgkin's lymphoma status post chemotherapy, HFmrEF, HTN, seizure disorder, polymyalgia rheumatica who presents to the ED with left sided face, forehead and jaw  numbness, as well as tingling to the fingers of her left hand. MRI revealed a 6 mm acute nonhemorrhagic infarct in the lateral right thalamus with subtle T2 signal changes, admitted for full stroke workup.  NIH on Admission: 2.  Stroke:  right thalamus infarct, etiology likely large vessel disease due to right PCA progressive stenosis  CT head No acute abnormality. Small vessel disease. Atrophy.  CTA head & neck Positive for progressed since April, moderate Right PCA irregularity and stenosis likely affecting the Right Thalamostriate Artery region. MRI 6 mm acute nonhemorrhagic infarct in the lateral right thalamus with subtle T2 signal changes. 2D Echo EF 40 to 45% LDL 86 HgbA1c 6.0 VTE prophylaxis - Eliquis  Eliquis  BID prior to admission, now on aspirin  81 mg and Eliquis  5 mg twice daily Therapy recommendations:  No follow up needed  Disposition: Home today  Hx of Seizures April 2025: Focal seizure which manifested as trouble writing and mild expressive aphasia.  She was placed on Keppra  750 mg twice daily and reports medication compliance. Continue home Keppra  Follow-up with Dr. Gregg on 02/06/2024  Hx of Afib Home Meds: Eliquis  5mg  BID, TOPROL  daily Continue telemetry monitoring Continue Eliquis   CHF 08/2023 EF 55 to 60% 11/2023 EF 40 to 45% This admission EF 40 to 45% Home meds including metoprolol , Entresto  and Farxiga  Continue home as but avoid low BP Follow-up with cardiology as scheduled  Hypertension Home meds: Toprol -XL 50 mg daily, Entresto  twice daily  BP stable  Blood pressure goal normotensive Avoid hypotension due to right PCA stenosis  Hyperlipidemia Home meds: None LDL 86, goal < 70 On Crestor  20mg  Continue statin at discharge  Other Stroke Risk Factors Obesity, Body mass index is 30.51 kg/m., BMI >/= 30 associated with increased stroke risk, recommend weight loss, diet and exercise as appropriate  Migraines Patient takes nortriptyline  nightly, has for  many years Reports no recent migraines or headaches  Other Active Problems Hx of Cancer breast cancer s/p lumpectomy and radiation therapy  cervical cancer status post hysterectomy  non-Hodgkin's lymphoma status post chemotherapy   Hospital day # 0   Pt seen by Neuro NP/APP and later by MD. Note/plan to be edited by MD as needed.    Rocky JAYSON Likes, DNP, AGACNP-BC [facility] Please use AMION for contact information & EPIC for messaging.  ATTENDING NOTE: I reviewed above note and agree with the assessment and plan. Pt was seen and examined.   Friend at bedside.  Patient lying bed, still has left cheek and lip as well as left fingertip numbness but has improved from yesterday.  MRI showed right thalamic small infarct, consistent with right PCA progressive stenosis identified on CTA.  Add aspirin  81 on top of Eliquis .  LDL not at goal, started statin.  Patient at home on GDMT regimen, recommend BP monitoring at home to avoid hypotension.  Continue Keppra .  Follow-up with Dr. Gregg on 9/18 at [facility].  PT and OT no recommendation.  For detailed assessment and plan, please refer to above as I have made changes wherever appropriate.   Neurology will sign off. Please call with questions. Pt will follow up with Dr. Gregg at [facility] on 02/06/2024. Thanks for the consult.   Ary Cummins, MD PhD Stroke Neurology 02/01/2024 5:12 PM     To contact Stroke Continuity provider, please refer to [email or web address]. After hours, contact General Neurology

## 2024-02-01 NOTE — Evaluation (Addendum)
 Physical Therapy Evaluation and Discharge Patient Details Name: Melissa Sosa MRN: [id number] DOB: 02/20/1946 Today's Date: 02/01/2024  History of Present Illness  78 y.o. female  presents to the ED 01/31/24 for evaluation of left-sided facial numbness and numbness/tingling of the fingertips of her left hand; Brain MRI showing a 6 mm acute nonhemorrhagic infarct in the lateral right thalamus; PMH- significant of breast cancer status post lumpectomy and radiation in remission, cervical cancer status post hysterectomy, non-Hodgkin's lymphoma status post chemotherapy, HFmrEF, PAF on Eliquis , LBBB, hypertension, polymyalgia rheumatica, anxiety/depression, class I obesity, osteoarthritis, seizure disorder.  Clinical Impression   Patient evaluated by Physical Therapy with no further PT needs identified. PT is signing off. Thank you for this referral.         If plan is discharge home, recommend the following:     Can travel by private vehicle        Equipment Recommendations None recommended by PT  Recommendations for Other Services       Functional Status Assessment Patient has not had a recent decline in their functional status     Precautions / Restrictions Precautions Precautions: Fall      Mobility  Bed Mobility Overal bed mobility: Modified Independent             General bed mobility comments: ED stretcher; HOB slightly elevated    Transfers Overall transfer level: Independent Equipment used: None               General transfer comment: denied need for HHA to simulate use of cane    Ambulation/Gait Ambulation/Gait assistance: Independent Gait Distance (Feet): 35 Feet Assistive device: None Gait Pattern/deviations: Step-to pattern, Antalgic, Decreased stride length   Gait velocity interpretation: <1.8 ft/sec, indicate of risk for recurrent falls   General Gait Details: slightly antalgic due to Rt knee pain; no UE support; slower pace but  steady  Careers information officer     Tilt Bed    Modified Rankin (Stroke Patients Only) Modified Rankin (Stroke Patients Only) Pre-Morbid Rankin Score: No significant disability Modified Rankin: No significant disability     Balance Overall balance assessment: Mild deficits observed, not formally tested                                           Pertinent Vitals/Pain Pain Assessment Pain Assessment: No/denies pain    Home Living Family/patient expects to be discharged to:: Private residence Living Arrangements: Alone Available Help at Discharge: Family;Available PRN/intermittently;Friend(s);Neighbor Type of Home: Other(Comment) (condo) Home Access: Stairs to enter Entrance Stairs-Rails: None Entrance Stairs-Number of Steps: 1   Home Layout: One level Home Equipment: Grab bars - tub/shower;Shower seat - built in;Cane - quad;Hand held Engineer, civil (consulting) - single point;Shower seat      Prior Function Prior Level of Function : Independent/Modified Independent             Mobility Comments: walks with cane with triangular tip; denies falls ADLs Comments: does not use either shower seat; independent     Extremity/Trunk Assessment   Upper Extremity Assessment Upper Extremity Assessment: Overall WFL for tasks assessed (slight numbness tip of Left finger)    Lower Extremity Assessment Lower Extremity Assessment: Generalized weakness (rt knee worse arthritis than left)    Cervical / Trunk Assessment Cervical / Trunk Assessment: Kyphotic  Communication  Communication Communication: No apparent difficulties    Cognition Arousal: Alert Behavior During Therapy: WFL for tasks assessed/performed   PT - Cognitive impairments: No apparent impairments                       PT - Cognition Comments: a&o x4; able to describe home and her normal routine in detail Following commands: Intact       Cueing Cueing  Techniques: Verbal cues     General Comments General comments (skin integrity, edema, etc.): Pt did not want to ambulate farther without her cane and due to rt knee pain.    Exercises     Assessment/Plan    PT Assessment Patient does not need any further PT services  PT Problem List         PT Treatment Interventions      PT Goals (Current goals can be found in the Care Plan section)  Acute Rehab PT Goals PT Goal Formulation: All assessment and education complete, DC therapy    Frequency       Co-evaluation               AM-PAC PT 6 Clicks Mobility  Outcome Measure Help needed turning from your back to your side while in a flat bed without using bedrails?: None Help needed moving from lying on your back to sitting on the side of a flat bed without using bedrails?: None Help needed moving to and from a bed to a chair (including a wheelchair)?: None Help needed standing up from a chair using your arms (e.g., wheelchair or bedside chair)?: None Help needed to walk in hospital room?: None Help needed climbing 3-5 steps with a railing? : None 6 Click Score: 24    End of Session   Activity Tolerance: Patient limited by pain Patient left: in bed;with call bell/phone within reach;with family/visitor present Nurse Communication: Mobility status;Other (comment) (no PT or OT needs) PT Visit Diagnosis: Difficulty in walking, not elsewhere classified (R26.2)    Time: 1152-1209 PT Time Calculation (min) (ACUTE ONLY): 17 min   Charges:   PT Evaluation $PT Eval Low Complexity: 1 Low   PT General Charges $$ ACUTE PT VISIT: 1 Visit          Macario RAMAN, PT Acute Rehabilitation Services  Office [phone]   [name] 02/01/2024, 12:26 PM

## 2024-02-01 NOTE — Assessment & Plan Note (Addendum)
 02-01-2024 continue keppra  750 mg bid. F/u with outpatient neurology.

## 2024-02-01 NOTE — Hospital Course (Addendum)
 CC: left facial numbness HPI: Melissa Sosa is a 78 y.o. female with medical history significant of breast cancer status post lumpectomy and radiation in remission, cervical cancer status post hysterectomy, non-Hodgkin's lymphoma status post chemotherapy, HFmrEF, PAF on Eliquis , LBBB, hypertension, polymyalgia rheumatica, anxiety/depression, class I obesity, osteoarthritis, seizure disorder.  Hospital admission last month 8/5-8/7 for CHF exacerbation and A-fib with RVR.  Patient presents to the ED today for evaluation of left-sided facial numbness and numbness/tingling of the fingertips of her left hand since 3 PM today.  Denies experiencing any changes in vision, difficulty with speech, or weakness of arm or leg.  Denies history of previous stroke.  She is compliant with her home Eliquis .  No other complaints.  Denies cough, shortness of breath, chest pain, vomiting, or abdominal pain.   ED Course: Vital signs stable. Labs notable for hemoglobin 15.7, glucose 116, troponin negative x 2, UA not suggestive of infection. CT head showing no acute intracranial abnormality. Brain MRI showing a 6 mm acute nonhemorrhagic infarct in the lateral right thalamus with subtle T2 signal changes. Showing left mastoid effusion and no obstructing nasopharyngeal lesion. Patient was given Ativan  and Keppra . Neurology consulted.   Significant Events: Admitted 01/31/2024 acute CVA of right thalamus   Admission Labs: WBC 5.5, HgB 15.7, plt 226 Mg 2.0 Na 139, K 4.3, CO2 of 23, BUN 15, Scr 0.89, glu 116 UA glu >500, otherwise negative A1C of 6.0% T. Chol 160, HDL 52, LDL 86, TG 112  Admission Imaging Studies: CT head No acute intracranial abnormality. 2. Generalized age-related cerebral atrophy with chronic small vessel ischemic disease  Significant Labs:   Significant Imaging Studies: MRI brain 6 mm acute nonhemorrhagic infarct in the lateral right thalamus with subtle T2 signal changes. 2. Left mastoid effusion.  No obstructing nasopharyngeal lesion. CTA head/neck Negative for large vessel occlusion. Positive for progressed since April, moderate Right PCA irregularity and stenosis likely affecting the Right Thalamostriate Artery region. 2. Otherwise stable CTA Head and Neck. Mild for age atherosclerosis and no other significant stenosis. 3. Stable non contrast CT appearance of the brain. Echo LVEF 40-45%. No intra-atrial shunt.  Antibiotic Therapy: Anti-infectives (From admission, onward)    None       Procedures:   Consultants: neurology

## 2024-02-01 NOTE — Assessment & Plan Note (Addendum)
 02-01-2024 on Toprol -XL and entresto . F/u with outpatient cards.

## 2024-02-01 NOTE — Progress Notes (Signed)
 OT Cancellation Note  Patient Details Name: Melissa Sosa MRN: [id number] DOB: Apr 03, 1946   Cancelled Treatment:    Reason Eval/Treat Not Completed: OT screened, no needs identified, will sign off. Per PT, pt independent and cognition WFL during screening.   [name] [name], OTR/L [facility] Acute Rehabilitation Office: [phone]   [name] 02/01/2024, 12:34 PM

## 2024-02-01 NOTE — ED Notes (Signed)
 Pt eating breakfast  Pt updated on care plan

## 2024-02-01 NOTE — Subjective & Objective (Signed)
 Pt seen and examined. Met with pt and dtr leah at bedside. Neurology has given discharge clearance. Only added asa 81 mg and crestor  20 mg to therapy. Continue with eliquis . Echo shows LVEF 40-45%. Stable. No thrombus. Stable for DC.  Pt has been seen by PT and OT. No needs identified.

## 2024-02-01 NOTE — Progress Notes (Signed)
 OT Cancellation Note  Patient Details Name: Melissa Sosa MRN: [id number] DOB: 1946/03/02   Cancelled Treatment:    Reason Eval/Treat Not Completed: Patient at procedure or test/ unavailable. At CT, will return at as schedule allows.   [name] [name], OTR/L [facility] Acute Rehabilitation Office: [phone]   [name] 02/01/2024, 12:11 PM

## 2024-02-01 NOTE — Assessment & Plan Note (Addendum)
 02-01-2024 MRI brain showed small 6 mm CVA. Pt with only some residual left sided facial numbness and left finger numbness. No PT/OT needs identified.  Neurology has given discharge clearance. Only added asa 81 mg and crestor  20 mg to therapy. Continue with eliquis . Echo shows LVEF 40-45%. Stable. No thrombus. Stable for DC.  Lipid Panel: Lab Results  Component Value Date/Time   CHOL 160 02/01/2024 01:55 AM   TRIG 112 02/01/2024 01:55 AM   HDL 52 02/01/2024 01:55 AM   CHOLHDL 3.1 02/01/2024 01:55 AM   VLDL 22 02/01/2024 01:55 AM   LDLCALC 86 02/01/2024 01:55 AM

## 2024-02-04 DIAGNOSIS — I110 Hypertensive heart disease with heart failure: Secondary | ICD-10-CM | POA: Diagnosis not present

## 2024-02-04 DIAGNOSIS — F32A Depression, unspecified: Secondary | ICD-10-CM | POA: Diagnosis not present

## 2024-02-04 DIAGNOSIS — Z8541 Personal history of malignant neoplasm of cervix uteri: Secondary | ICD-10-CM | POA: Diagnosis not present

## 2024-02-04 DIAGNOSIS — Z853 Personal history of malignant neoplasm of breast: Secondary | ICD-10-CM | POA: Diagnosis not present

## 2024-02-04 DIAGNOSIS — Z5982 Transportation insecurity: Secondary | ICD-10-CM | POA: Diagnosis not present

## 2024-02-04 DIAGNOSIS — I447 Left bundle-branch block, unspecified: Secondary | ICD-10-CM | POA: Diagnosis not present

## 2024-02-04 DIAGNOSIS — M1990 Unspecified osteoarthritis, unspecified site: Secondary | ICD-10-CM | POA: Diagnosis not present

## 2024-02-04 DIAGNOSIS — I4891 Unspecified atrial fibrillation: Secondary | ICD-10-CM | POA: Diagnosis not present

## 2024-02-04 DIAGNOSIS — G43909 Migraine, unspecified, not intractable, without status migrainosus: Secondary | ICD-10-CM | POA: Diagnosis not present

## 2024-02-04 DIAGNOSIS — Z923 Personal history of irradiation: Secondary | ICD-10-CM | POA: Diagnosis not present

## 2024-02-04 DIAGNOSIS — M353 Polymyalgia rheumatica: Secondary | ICD-10-CM | POA: Diagnosis not present

## 2024-02-04 DIAGNOSIS — Z8572 Personal history of non-Hodgkin lymphomas: Secondary | ICD-10-CM | POA: Diagnosis not present

## 2024-02-04 DIAGNOSIS — E669 Obesity, unspecified: Secondary | ICD-10-CM | POA: Diagnosis not present

## 2024-02-04 DIAGNOSIS — Z9221 Personal history of antineoplastic chemotherapy: Secondary | ICD-10-CM | POA: Diagnosis not present

## 2024-02-04 DIAGNOSIS — Z90710 Acquired absence of both cervix and uterus: Secondary | ICD-10-CM | POA: Diagnosis not present

## 2024-02-04 DIAGNOSIS — J479 Bronchiectasis, uncomplicated: Secondary | ICD-10-CM | POA: Diagnosis not present

## 2024-02-04 DIAGNOSIS — Z7901 Long term (current) use of anticoagulants: Secondary | ICD-10-CM | POA: Diagnosis not present

## 2024-02-04 DIAGNOSIS — I5043 Acute on chronic combined systolic (congestive) and diastolic (congestive) heart failure: Secondary | ICD-10-CM | POA: Diagnosis not present

## 2024-02-04 DIAGNOSIS — Z8701 Personal history of pneumonia (recurrent): Secondary | ICD-10-CM | POA: Diagnosis not present

## 2024-02-04 DIAGNOSIS — F419 Anxiety disorder, unspecified: Secondary | ICD-10-CM | POA: Diagnosis not present

## 2024-02-04 DIAGNOSIS — Z6830 Body mass index (BMI) 30.0-30.9, adult: Secondary | ICD-10-CM | POA: Diagnosis not present

## 2024-02-04 DIAGNOSIS — R569 Unspecified convulsions: Secondary | ICD-10-CM | POA: Diagnosis not present

## 2024-02-04 DIAGNOSIS — D509 Iron deficiency anemia, unspecified: Secondary | ICD-10-CM | POA: Diagnosis not present

## 2024-02-06 ENCOUNTER — Encounter: Payer: Self-pay | Admitting: Neurology

## 2024-02-06 ENCOUNTER — Ambulatory Visit: Admitting: Neurology

## 2024-02-06 ENCOUNTER — Other Ambulatory Visit (HOSPITAL_COMMUNITY): Payer: Self-pay

## 2024-02-06 VITALS — BP 118/62 | Ht 66.0 in | Wt 190.0 lb

## 2024-02-06 DIAGNOSIS — G40109 Localization-related (focal) (partial) symptomatic epilepsy and epileptic syndromes with simple partial seizures, not intractable, without status epilepticus: Secondary | ICD-10-CM | POA: Diagnosis not present

## 2024-02-06 DIAGNOSIS — I6381 Other cerebral infarction due to occlusion or stenosis of small artery: Secondary | ICD-10-CM | POA: Diagnosis not present

## 2024-02-06 MED ORDER — LEVETIRACETAM 750 MG PO TABS: 750.0000 mg | ORAL_TABLET | Freq: Two times a day (BID) | ORAL | 4 refills | Status: AC

## 2024-02-06 NOTE — Progress Notes (Signed)
 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 No seizures reported   INTERVAL HISTORY 02/06/2024 Patient presents today for follow-up, last visit was in April at that time we continued her on Keppra  750 mg twice daily.  She reports compliance with the medication, tells me that her side effect of somnolence has resolved and has not had any seizure or seizure like activity. However patient was recently admitted last week for for left facial numbness and left fingertips numbness and found to have an acute 6 mm right thalamic stroke.  Stroke etiology likely small vessel disease.  She was also found to have atrial fibrillation already on Eliquis , aspirin  was started with Crestor  20.  Other than the slight numbness on the left side of her face and fingertips she does not have any other residual deficit from her stroke.   HISTORY OF PRESENT ILLNESS:  This is a 78 year old woman with past medical history of hypertension, polymyalgia rheumatica, history of breast cancer, chronic pain who is presenting after being admitted to the hospital for aphasia, diagnosed with likely seizures.  Patient reported initially she did not feel well the day of presentation, she presented to her neighbor and told her that she was not feeling well; from there her memory was not the best during the following next 2 days.  She tells me that she can remember bits and pieces during that time, that her speech was gibberish, sometimes she felt like she was saying something but it was not making sense.  Her friend did call 911 and she was taken to the hospital.  Family was also at bedside.  She had extensive workup including MRI, LP, and EEG which  showed continuous left frontotemporal slowing.  She also had a temporal artery biopsy which was negative. After MRI was negative for stroke, she was loaded with Keppra  and the next morning, she tells me that her speech got better.  She was diagnosed with likely seizures, started on Keppra  750 mg twice daily.  Since starting the medication, her son tells me that patient's speech is back to baseline.  She does report some somnolence with Keppra  but otherwise she is doing very well.  She never had something similar in the past.  Again since taking the Keppra , she has not had any additional events.  Denies any family history of seizures, denies any previous history of stroke or brain trauma or brain infection. Hospital course and summary below.   Handedness: Right handed  Onset: 09/03/2023  Seizure Type: Confusion, aphasia   Current frequency: only once while admitted in 09/03/2023  Any injuries from seizures: Denies   Seizure risk factors: Atrophy   Previous ASMs: None   Currenty ASMs: Levetiracetam  750 mg twice daily   ASMs side effects: Sleepiness, Tiredness   Brain Images: Generalized atrophy   Previous EEGs: Left fronto-temporal slowing    Hospital Course and Summary  Melissa Sosa is a 78 y.o.female with a history of polymyalgia rheumatica, breast cancer, hypertension, and anxiety who was admitted to the [facility] Medicine Teaching Service at [facility] for new onset expressive and receptive aphasia.    Her hospital course is detailed below:   AMS  Stroke-like symptoms Prior to presentation at  hospital patient was apparently found by a neighbor, who noted aphasia and possible ataxia. In the ED patient underwent extensive head imaging, which largely returned negative.  Questionable punctate focus in periventricular white matter of the left side, however this would not explain patient's symptomatology.  Started on long-term monitoring electroencephalogram after consultation with neurology,  which showed focal slowing on the left side, possible postictal state but no epileptiform changes.  Patient had an episode of decreased mental status; repeat stat CT negative.  LP, serum altered mental status labs all returned negative. Primary differential: Focal seizures not captured on EEG. Started on Keppra , which titrated upward to 750 mg twice daily.  Loaded with IV Depakote.  With therapy, patient continued to improve with speech fluency.  She was recommended for CIR though declined; therefore, she was discharged with outpatient SLP.   OTHER MEDICAL CONDITIONS: Hypertension, Hyperlipidemia, History of breast cancer, Obesity, chronic knee pain.  REVIEW OF SYSTEMS: Full 14 system review of systems performed and negative with exception of: As noted in the HPI   ALLERGIES: Allergies  Allergen Reactions   Sulfamethoxazole-Trimethoprim Hives and Rash   Venlafaxine Hives, Itching and Other (See Comments)    Headache   Imitrex [Sumatriptan]     hypertension   Morphine And Codeine Other (See Comments)    Hyper, confusion     HOME MEDICATIONS: Outpatient Medications Prior to Visit  Medication Sig Dispense Refill   acetaminophen  (TYLENOL ) 500 MG tablet Take 2 tablets (1,000 mg total) by mouth every 6 (six) hours as needed. (Patient taking differently: Take 500-1,000 mg by mouth every 6 (six) hours as needed.)     apixaban  (ELIQUIS ) 5 MG TABS tablet Take 1 tablet (5 mg total) by mouth 2 (two) times daily. 180 tablet 0   aspirin  EC 81 MG tablet Take 1 tablet (81 mg total) by mouth daily. Swallow whole.     dapagliflozin  propanediol (FARXIGA ) 10 MG TABS tablet Take 1 tablet (10 mg total) by mouth daily. (Patient taking differently: Take 10 mg by mouth at bedtime.) 90 tablet 0   furosemide  (LASIX ) 40 MG tablet Take one tablet  (40 mg) for weight gain greater than 3 pounds in 24 hours or 5 pounds in a week. 30 tablet 2   metoprolol  succinate (TOPROL -XL) 50 MG 24 hr tablet Take 1 tablet (50 mg  total) by mouth daily. Take with or immediately following a meal. 90 tablet 1   Multiple Vitamin (MULTIVITAMIN) tablet Take 1 tablet by mouth daily.     nortriptyline  (PAMELOR ) 25 MG capsule Take 25 mg by mouth at bedtime.     rosuvastatin  (CRESTOR ) 20 MG tablet Take 1 tablet (20 mg total) by mouth daily. 90 tablet 0   sacubitril -valsartan  (ENTRESTO ) 24-26 MG Take 1 tablet by mouth 2 (two) times daily. 180 tablet 2   levETIRAcetam  (KEPPRA ) 750 MG tablet Take 1 tablet (750 mg total) by mouth 2 (two) times daily. 180 tablet 0   nystatin (MYCOSTATIN/NYSTOP) powder Apply 1 Application topically See admin instructions. Application Topical 2-3 Times Daily     No facility-administered medications prior to visit.    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 No pertinent  surgical history.  FAMILY HISTORY: History reviewed. No pertinent family history.  SOCIAL HISTORY: Social History   Socioeconomic History   Marital status: Widowed    Spouse name: Not on file   Number of children: Not on file   Years of education: Not on file   Highest education level: Not on file  Occupational History   Not on file  Tobacco Use   Smoking status: Never   Smokeless tobacco: Never  Vaping Use   Vaping status: Never Used  Substance and Sexual Activity   Alcohol use: No   Drug use: Never   Sexual activity: Not on file  Other Topics Concern   Not on file  Social History Narrative   Right handed   Caffeine 1 cup daily   Lives alone   Social Drivers of Health   Financial Resource Strain: Low Risk  (12/27/2023)   Overall Financial Resource Strain (CARDIA)    Difficulty of Paying Living Expenses: Not hard at all   Food Insecurity: No Food Insecurity (12/24/2023)   Hunger Vital Sign    Worried About Running Out of Food in the Last Year: Never true    Ran Out of Food in the Last Year: Never true  Transportation Needs: No Transportation Needs (12/24/2023)   PRAPARE - Administrator, Civil Service (Medical): No    Lack of Transportation (Non-Medical): No  Physical Activity: Not on file  Stress: Not on file  Social Connections: Patient Declined (12/24/2023)   Social Connection and Isolation Panel    Frequency of Communication with Friends and Family: Patient declined    Frequency of Social Gatherings with Friends and Family: Patient declined    Attends Religious Services: Patient declined    Database administrator or Organizations: Patient declined    Attends Banker Meetings: Patient declined    Marital Status: Patient declined  Intimate Partner Violence: Not At Risk (12/24/2023)   Humiliation, Afraid, Rape, and Kick questionnaire    Fear of Current or Ex-Partner: No    Emotionally Abused: No    Physically Abused: No    Sexually Abused: No    PHYSICAL EXAM   GENERAL EXAM/CONSTITUTIONAL: Vitals:  Vitals:   02/06/24 0912  BP: 118/62  Weight: 190 lb (86.2 kg)  Height: 5' 6 (1.676 m)    Body mass index is 30.67 kg/m. Wt Readings from Last 3 Encounters:  02/06/24 190 lb (86.2 kg)  01/31/24 189 lb (85.7 kg)  12/30/23 191 lb 6.4 oz (86.8 kg)   Patient is in no distress; well developed, nourished and groomed; neck is supple  MUSCULOSKELETAL: Gait, strength, tone, movements noted in Neurologic exam below  NEUROLOGIC: MENTAL STATUS:      No data to display         awake, alert, oriented to person, place and time recent and remote memory intact normal attention and concentration language fluent, comprehension intact, naming intact fund of knowledge appropriate  CRANIAL NERVE:  2nd, 3rd, 4th, 6th - Visual fields full to confrontation, extraocular muscles  intact, no nystagmus 5th - facial sensation symmetric 7th - facial strength symmetric 8th - hearing intact 9th - palate elevates symmetrically, uvula midline 11th - shoulder shrug symmetric 12th - tongue protrusion midline  MOTOR:  normal bulk and tone, full strength in the BUE, BLE  SENSORY:  normal and symmetric to light touch. There is increase sensation to pinprick to left fingertips  COORDINATION:  finger-nose-finger, fine finger movements normal  GAIT/STATION:  Antalgic gait    DIAGNOSTIC DATA (LABS, IMAGING, TESTING) - I reviewed patient records, labs, notes, testing and imaging myself where available.  Lab Results  Component Value Date   WBC 5.5 01/31/2024   HGB 16.3 (H) 01/31/2024   HCT 48.0 (H) 01/31/2024   MCV 89.5 01/31/2024   PLT 226 01/31/2024      Component Value Date/Time   NA 141 01/31/2024 1812   K 4.4 01/31/2024 1812   CL 107 01/31/2024 1812   CO2 23 01/31/2024 1730   GLUCOSE 122 (H) 01/31/2024 1812   BUN 17 01/31/2024 1812   CREATININE 0.90 01/31/2024 1812   CALCIUM  9.7 01/31/2024 1730   PROT 7.3 12/24/2023 1620   ALBUMIN 3.2 (L) 12/26/2023 0226   AST 41 12/24/2023 1620   ALT 21 12/24/2023 1620   ALKPHOS 84 12/24/2023 1620   BILITOT 1.0 12/24/2023 1620   GFRNONAA >60 01/31/2024 1730   GFRAA >60 10/31/2019 1714   Lab Results  Component Value Date   CHOL 160 02/01/2024   HDL 52 02/01/2024   LDLCALC 86 02/01/2024   TRIG 112 02/01/2024   Lab Results  Component Value Date   HGBA1C 6.0 (H) 02/01/2024   Lab Results  Component Value Date   VITAMINB12 488 09/04/2023   Lab Results  Component Value Date   TSH 0.799 12/10/2023    MRI Brain 09/03/2023 1. Possible punctate acute infarct within the left periatrial white matter (versus volume averaging of adjacent choroid plexus). 2. Background mild cerebral white matter chronic small vessel ischemic disease. 3. Mild generalized cerebral atrophy. 4. Minor paranasal sinus mucosal  thickening  MRI Brain 01/31/2024 1. 6 mm acute nonhemorrhagic infarct in the lateral right thalamus with subtle T2 signal changes. 2. Left mastoid effusion. No obstructing nasopharyngeal lesion.  EEG 09/04/2023 - Continuous slow, left fronto-temporal region   I personally reviewed brain Images and previous EEG reports.   ASSESSMENT AND PLAN  78 y.o. year old female  with history of polymyalgia rheumatica, hypertension, history of breast cancer, chronic pain who presenting for follow-up for her seizure and recent stroke.  In terms of the seizure, she is doing well on Keppra  750 mg twice daily, will continue patient on Keppra  750 mg twice daily and she understands to contact us  if she does have any breakthrough seizure.   For her right thalamic stroke, etiology likely small vessel disease, she was already on Eliquis  for atrial fibrillation, aspirin  and statin were started.  We discussed possibility of easy bruising and increased risk of bleeding with the combination of aspirin  and Plavix but she needs these medications based on her medical history and diagnoses.  Other than the increase sensitivity to the fingertip she does not have any deficit from her stroke.  She will continue to follow with PCP and return in 1 year or sooner if worse.   1. 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   Per [place]  DMV statutes, patients with seizures are not allowed to drive until they have been seizure-free for six months.  Other recommendations include using caution when using heavy equipment or power tools. Avoid working on ladders or at heights. Take showers instead of baths.  Do not swim alone.  Ensure the water temperature is not  too high on the home water heater. Do not go  swimming alone. Do not lock yourself in a room alone (i.e. bathroom). When caring for infants or small children, sit down when holding, feeding, or changing them to minimize risk of injury to the child in the event you have a seizure. Maintain good sleep hygiene. Avoid alcohol.  Also recommend adequate sleep, hydration, good diet and minimize stress.   During the Seizure  - First, ensure adequate ventilation and place patients on the floor on their left side  Loosen clothing around the neck and ensure the airway is patent. If the patient is clenching the teeth, do not force the mouth open with any object as this can cause severe damage - Remove all items from the surrounding that can be hazardous. The patient may be oblivious to what's happening and may not even know what he or she is doing. If the patient is confused and wandering, either gently guide him/her away and block access to outside areas - Reassure the individual and be comforting - Call 911. In most cases, the seizure ends before EMS arrives. However, there are cases when seizures may last over 3 to 5 minutes. Or the individual may have developed breathing difficulties or severe injuries. If a pregnant patient or a person with diabetes develops a seizure, it is prudent to call an ambulance. - Finally, if the patient does not regain full consciousness, then call EMS. Most patients will remain confused for about 45 to 90 minutes after a seizure, so you must use judgment in calling for help. - Avoid restraints but make sure the patient is in a bed with padded side rails - Place the individual in a lateral position with the neck slightly flexed; this will help the saliva drain from the mouth and prevent the tongue from falling backward - Remove all nearby furniture and other hazards from the area - Provide verbal assurance as the individual is regaining consciousness - Provide the patient with privacy if possible - Call for help and start  treatment as ordered by the caregiver   After the Seizure (Postictal Stage)  After a seizure, most patients experience confusion, fatigue, muscle pain and/or a headache. Thus, one should permit the individual to sleep. For the next few days, reassurance is essential. Being calm and helping reorient the person is also of importance.  Most seizures are painless and end spontaneously. Seizures are not harmful to others but can lead to complications such as stress on the lungs, brain and the heart. Individuals with prior lung problems may develop labored breathing and respiratory distress.    Discussed Patients with epilepsy have a small risk of sudden unexpected death, a condition referred to as sudden unexpected death in epilepsy (SUDEP). SUDEP is defined specifically as the sudden, unexpected, witnessed or unwitnessed, nontraumatic and nondrowning death in patients with epilepsy with or without evidence for a seizure, and excluding documented status epilepticus, in which post mortem examination does not reveal a structural or toxicologic cause for death     No orders of the defined types were placed in this encounter.   Meds ordered this encounter  Medications   levETIRAcetam  (KEPPRA ) 750 MG tablet    Sig: Take 1 tablet (750 mg total) by mouth 2 (two) times daily.    Dispense:  180 tablet    Refill:  4    Return in about 1 year (around 02/05/2025).   Pastor Falling, MD 02/06/2024, 11:20 AM  [facility]  [address], [address] [place], [place] [address] [phone]

## 2024-02-06 NOTE — Patient Instructions (Signed)
 Continue current medications including: Levetiracetam  1000 mg twice daily Aspirin  81 mg daily and Eliquis  5 mg twice daily Continue follow-up PCP Return in 1 year or sooner if worse.

## 2024-02-07 ENCOUNTER — Other Ambulatory Visit (HOSPITAL_COMMUNITY): Payer: Self-pay | Admitting: Cardiology

## 2024-02-07 MED ORDER — DAPAGLIFLOZIN PROPANEDIOL 10 MG PO TABS: 10.0000 mg | ORAL_TABLET | Freq: Every day | ORAL | 3 refills | Status: DC

## 2024-02-12 DIAGNOSIS — I4891 Unspecified atrial fibrillation: Secondary | ICD-10-CM | POA: Diagnosis not present

## 2024-02-12 DIAGNOSIS — Z8673 Personal history of transient ischemic attack (TIA), and cerebral infarction without residual deficits: Secondary | ICD-10-CM | POA: Diagnosis not present

## 2024-02-12 DIAGNOSIS — R569 Unspecified convulsions: Secondary | ICD-10-CM | POA: Diagnosis not present

## 2024-02-12 DIAGNOSIS — I5020 Unspecified systolic (congestive) heart failure: Secondary | ICD-10-CM | POA: Diagnosis not present

## 2024-02-14 DIAGNOSIS — Z8701 Personal history of pneumonia (recurrent): Secondary | ICD-10-CM | POA: Diagnosis not present

## 2024-02-14 DIAGNOSIS — Z6830 Body mass index (BMI) 30.0-30.9, adult: Secondary | ICD-10-CM | POA: Diagnosis not present

## 2024-02-14 DIAGNOSIS — G43909 Migraine, unspecified, not intractable, without status migrainosus: Secondary | ICD-10-CM | POA: Diagnosis not present

## 2024-02-14 DIAGNOSIS — D509 Iron deficiency anemia, unspecified: Secondary | ICD-10-CM | POA: Diagnosis not present

## 2024-02-14 DIAGNOSIS — R569 Unspecified convulsions: Secondary | ICD-10-CM | POA: Diagnosis not present

## 2024-02-14 DIAGNOSIS — I447 Left bundle-branch block, unspecified: Secondary | ICD-10-CM | POA: Diagnosis not present

## 2024-02-14 DIAGNOSIS — Z90710 Acquired absence of both cervix and uterus: Secondary | ICD-10-CM | POA: Diagnosis not present

## 2024-02-14 DIAGNOSIS — Z8541 Personal history of malignant neoplasm of cervix uteri: Secondary | ICD-10-CM | POA: Diagnosis not present

## 2024-02-14 DIAGNOSIS — Z853 Personal history of malignant neoplasm of breast: Secondary | ICD-10-CM | POA: Diagnosis not present

## 2024-02-14 DIAGNOSIS — I4891 Unspecified atrial fibrillation: Secondary | ICD-10-CM | POA: Diagnosis not present

## 2024-02-14 DIAGNOSIS — Z8572 Personal history of non-Hodgkin lymphomas: Secondary | ICD-10-CM | POA: Diagnosis not present

## 2024-02-14 DIAGNOSIS — Z9221 Personal history of antineoplastic chemotherapy: Secondary | ICD-10-CM | POA: Diagnosis not present

## 2024-02-14 DIAGNOSIS — I5043 Acute on chronic combined systolic (congestive) and diastolic (congestive) heart failure: Secondary | ICD-10-CM | POA: Diagnosis not present

## 2024-02-14 DIAGNOSIS — M1990 Unspecified osteoarthritis, unspecified site: Secondary | ICD-10-CM | POA: Diagnosis not present

## 2024-02-14 DIAGNOSIS — F419 Anxiety disorder, unspecified: Secondary | ICD-10-CM | POA: Diagnosis not present

## 2024-02-14 DIAGNOSIS — Z5982 Transportation insecurity: Secondary | ICD-10-CM | POA: Diagnosis not present

## 2024-02-14 DIAGNOSIS — E669 Obesity, unspecified: Secondary | ICD-10-CM | POA: Diagnosis not present

## 2024-02-14 DIAGNOSIS — I110 Hypertensive heart disease with heart failure: Secondary | ICD-10-CM | POA: Diagnosis not present

## 2024-02-14 DIAGNOSIS — Z923 Personal history of irradiation: Secondary | ICD-10-CM | POA: Diagnosis not present

## 2024-02-14 DIAGNOSIS — Z7901 Long term (current) use of anticoagulants: Secondary | ICD-10-CM | POA: Diagnosis not present

## 2024-02-14 DIAGNOSIS — J479 Bronchiectasis, uncomplicated: Secondary | ICD-10-CM | POA: Diagnosis not present

## 2024-02-14 DIAGNOSIS — M353 Polymyalgia rheumatica: Secondary | ICD-10-CM | POA: Diagnosis not present

## 2024-02-14 DIAGNOSIS — F32A Depression, unspecified: Secondary | ICD-10-CM | POA: Diagnosis not present

## 2024-02-17 DIAGNOSIS — Z6830 Body mass index (BMI) 30.0-30.9, adult: Secondary | ICD-10-CM | POA: Diagnosis not present

## 2024-02-17 DIAGNOSIS — J479 Bronchiectasis, uncomplicated: Secondary | ICD-10-CM | POA: Diagnosis not present

## 2024-02-17 DIAGNOSIS — M1990 Unspecified osteoarthritis, unspecified site: Secondary | ICD-10-CM | POA: Diagnosis not present

## 2024-02-17 DIAGNOSIS — I447 Left bundle-branch block, unspecified: Secondary | ICD-10-CM | POA: Diagnosis not present

## 2024-02-17 DIAGNOSIS — I4891 Unspecified atrial fibrillation: Secondary | ICD-10-CM | POA: Diagnosis not present

## 2024-02-17 DIAGNOSIS — M353 Polymyalgia rheumatica: Secondary | ICD-10-CM | POA: Diagnosis not present

## 2024-02-17 DIAGNOSIS — F32A Depression, unspecified: Secondary | ICD-10-CM | POA: Diagnosis not present

## 2024-02-17 DIAGNOSIS — Z8541 Personal history of malignant neoplasm of cervix uteri: Secondary | ICD-10-CM | POA: Diagnosis not present

## 2024-02-17 DIAGNOSIS — F419 Anxiety disorder, unspecified: Secondary | ICD-10-CM | POA: Diagnosis not present

## 2024-02-17 DIAGNOSIS — I110 Hypertensive heart disease with heart failure: Secondary | ICD-10-CM | POA: Diagnosis not present

## 2024-02-17 DIAGNOSIS — I5043 Acute on chronic combined systolic (congestive) and diastolic (congestive) heart failure: Secondary | ICD-10-CM | POA: Diagnosis not present

## 2024-02-17 DIAGNOSIS — Z5982 Transportation insecurity: Secondary | ICD-10-CM | POA: Diagnosis not present

## 2024-02-17 DIAGNOSIS — Z853 Personal history of malignant neoplasm of breast: Secondary | ICD-10-CM | POA: Diagnosis not present

## 2024-02-17 DIAGNOSIS — Z7901 Long term (current) use of anticoagulants: Secondary | ICD-10-CM | POA: Diagnosis not present

## 2024-02-17 DIAGNOSIS — Z923 Personal history of irradiation: Secondary | ICD-10-CM | POA: Diagnosis not present

## 2024-02-17 DIAGNOSIS — G43909 Migraine, unspecified, not intractable, without status migrainosus: Secondary | ICD-10-CM | POA: Diagnosis not present

## 2024-02-17 DIAGNOSIS — Z9221 Personal history of antineoplastic chemotherapy: Secondary | ICD-10-CM | POA: Diagnosis not present

## 2024-02-17 DIAGNOSIS — Z8572 Personal history of non-Hodgkin lymphomas: Secondary | ICD-10-CM | POA: Diagnosis not present

## 2024-02-17 DIAGNOSIS — D509 Iron deficiency anemia, unspecified: Secondary | ICD-10-CM | POA: Diagnosis not present

## 2024-02-17 DIAGNOSIS — R569 Unspecified convulsions: Secondary | ICD-10-CM | POA: Diagnosis not present

## 2024-02-17 DIAGNOSIS — Z8701 Personal history of pneumonia (recurrent): Secondary | ICD-10-CM | POA: Diagnosis not present

## 2024-02-17 DIAGNOSIS — Z90710 Acquired absence of both cervix and uterus: Secondary | ICD-10-CM | POA: Diagnosis not present

## 2024-02-17 DIAGNOSIS — E669 Obesity, unspecified: Secondary | ICD-10-CM | POA: Diagnosis not present

## 2024-02-20 ENCOUNTER — Telehealth: Payer: Self-pay | Admitting: Neurology

## 2024-02-20 ENCOUNTER — Encounter: Payer: Self-pay | Admitting: Neurology

## 2024-02-20 NOTE — Telephone Encounter (Signed)
 Pt called wanting to know if she is still able to get her COVID shot even though she had a seizure 6 months ago and a small stroke 1 month ago. Please advise.

## 2024-02-22 NOTE — Telephone Encounter (Signed)
 Ok to take the COVID shot. Thanks

## 2024-02-25 DIAGNOSIS — H524 Presbyopia: Secondary | ICD-10-CM | POA: Diagnosis not present

## 2024-02-26 DIAGNOSIS — F419 Anxiety disorder, unspecified: Secondary | ICD-10-CM | POA: Diagnosis not present

## 2024-02-26 DIAGNOSIS — R569 Unspecified convulsions: Secondary | ICD-10-CM | POA: Diagnosis not present

## 2024-02-26 DIAGNOSIS — Z90710 Acquired absence of both cervix and uterus: Secondary | ICD-10-CM | POA: Diagnosis not present

## 2024-02-26 DIAGNOSIS — J479 Bronchiectasis, uncomplicated: Secondary | ICD-10-CM | POA: Diagnosis not present

## 2024-02-26 DIAGNOSIS — I5043 Acute on chronic combined systolic (congestive) and diastolic (congestive) heart failure: Secondary | ICD-10-CM | POA: Diagnosis not present

## 2024-02-26 DIAGNOSIS — G43909 Migraine, unspecified, not intractable, without status migrainosus: Secondary | ICD-10-CM | POA: Diagnosis not present

## 2024-02-26 DIAGNOSIS — Z8572 Personal history of non-Hodgkin lymphomas: Secondary | ICD-10-CM | POA: Diagnosis not present

## 2024-02-26 DIAGNOSIS — Z923 Personal history of irradiation: Secondary | ICD-10-CM | POA: Diagnosis not present

## 2024-02-26 DIAGNOSIS — E669 Obesity, unspecified: Secondary | ICD-10-CM | POA: Diagnosis not present

## 2024-02-26 DIAGNOSIS — Z6830 Body mass index (BMI) 30.0-30.9, adult: Secondary | ICD-10-CM | POA: Diagnosis not present

## 2024-02-26 DIAGNOSIS — I4891 Unspecified atrial fibrillation: Secondary | ICD-10-CM | POA: Diagnosis not present

## 2024-02-26 DIAGNOSIS — Z5982 Transportation insecurity: Secondary | ICD-10-CM | POA: Diagnosis not present

## 2024-02-26 DIAGNOSIS — D509 Iron deficiency anemia, unspecified: Secondary | ICD-10-CM | POA: Diagnosis not present

## 2024-02-26 DIAGNOSIS — I447 Left bundle-branch block, unspecified: Secondary | ICD-10-CM | POA: Diagnosis not present

## 2024-02-26 DIAGNOSIS — M1990 Unspecified osteoarthritis, unspecified site: Secondary | ICD-10-CM | POA: Diagnosis not present

## 2024-02-26 DIAGNOSIS — Z9221 Personal history of antineoplastic chemotherapy: Secondary | ICD-10-CM | POA: Diagnosis not present

## 2024-02-26 DIAGNOSIS — Z8701 Personal history of pneumonia (recurrent): Secondary | ICD-10-CM | POA: Diagnosis not present

## 2024-02-26 DIAGNOSIS — Z8541 Personal history of malignant neoplasm of cervix uteri: Secondary | ICD-10-CM | POA: Diagnosis not present

## 2024-02-26 DIAGNOSIS — I110 Hypertensive heart disease with heart failure: Secondary | ICD-10-CM | POA: Diagnosis not present

## 2024-02-26 DIAGNOSIS — Z853 Personal history of malignant neoplasm of breast: Secondary | ICD-10-CM | POA: Diagnosis not present

## 2024-02-26 DIAGNOSIS — Z7901 Long term (current) use of anticoagulants: Secondary | ICD-10-CM | POA: Diagnosis not present

## 2024-02-26 DIAGNOSIS — F32A Depression, unspecified: Secondary | ICD-10-CM | POA: Diagnosis not present

## 2024-02-26 DIAGNOSIS — M353 Polymyalgia rheumatica: Secondary | ICD-10-CM | POA: Diagnosis not present

## 2024-02-28 ENCOUNTER — Ambulatory Visit (HOSPITAL_COMMUNITY)

## 2024-02-28 ENCOUNTER — Other Ambulatory Visit (HOSPITAL_COMMUNITY)

## 2024-02-28 DIAGNOSIS — Z8701 Personal history of pneumonia (recurrent): Secondary | ICD-10-CM | POA: Diagnosis not present

## 2024-02-28 DIAGNOSIS — Z853 Personal history of malignant neoplasm of breast: Secondary | ICD-10-CM | POA: Diagnosis not present

## 2024-02-28 DIAGNOSIS — F32A Depression, unspecified: Secondary | ICD-10-CM | POA: Diagnosis not present

## 2024-02-28 DIAGNOSIS — Z8572 Personal history of non-Hodgkin lymphomas: Secondary | ICD-10-CM | POA: Diagnosis not present

## 2024-02-28 DIAGNOSIS — M353 Polymyalgia rheumatica: Secondary | ICD-10-CM | POA: Diagnosis not present

## 2024-02-28 DIAGNOSIS — D509 Iron deficiency anemia, unspecified: Secondary | ICD-10-CM | POA: Diagnosis not present

## 2024-02-28 DIAGNOSIS — I110 Hypertensive heart disease with heart failure: Secondary | ICD-10-CM | POA: Diagnosis not present

## 2024-02-28 DIAGNOSIS — Z7901 Long term (current) use of anticoagulants: Secondary | ICD-10-CM | POA: Diagnosis not present

## 2024-02-28 DIAGNOSIS — I5043 Acute on chronic combined systolic (congestive) and diastolic (congestive) heart failure: Secondary | ICD-10-CM | POA: Diagnosis not present

## 2024-02-28 DIAGNOSIS — Z8541 Personal history of malignant neoplasm of cervix uteri: Secondary | ICD-10-CM | POA: Diagnosis not present

## 2024-02-28 DIAGNOSIS — I447 Left bundle-branch block, unspecified: Secondary | ICD-10-CM | POA: Diagnosis not present

## 2024-02-28 DIAGNOSIS — Z923 Personal history of irradiation: Secondary | ICD-10-CM | POA: Diagnosis not present

## 2024-02-28 DIAGNOSIS — Z6830 Body mass index (BMI) 30.0-30.9, adult: Secondary | ICD-10-CM | POA: Diagnosis not present

## 2024-02-28 DIAGNOSIS — Z90710 Acquired absence of both cervix and uterus: Secondary | ICD-10-CM | POA: Diagnosis not present

## 2024-02-28 DIAGNOSIS — M1990 Unspecified osteoarthritis, unspecified site: Secondary | ICD-10-CM | POA: Diagnosis not present

## 2024-02-28 DIAGNOSIS — R569 Unspecified convulsions: Secondary | ICD-10-CM | POA: Diagnosis not present

## 2024-02-28 DIAGNOSIS — G43909 Migraine, unspecified, not intractable, without status migrainosus: Secondary | ICD-10-CM | POA: Diagnosis not present

## 2024-02-28 DIAGNOSIS — E669 Obesity, unspecified: Secondary | ICD-10-CM | POA: Diagnosis not present

## 2024-02-28 DIAGNOSIS — I4891 Unspecified atrial fibrillation: Secondary | ICD-10-CM | POA: Diagnosis not present

## 2024-02-28 DIAGNOSIS — Z5982 Transportation insecurity: Secondary | ICD-10-CM | POA: Diagnosis not present

## 2024-02-28 DIAGNOSIS — Z9221 Personal history of antineoplastic chemotherapy: Secondary | ICD-10-CM | POA: Diagnosis not present

## 2024-02-28 DIAGNOSIS — J479 Bronchiectasis, uncomplicated: Secondary | ICD-10-CM | POA: Diagnosis not present

## 2024-02-28 DIAGNOSIS — F419 Anxiety disorder, unspecified: Secondary | ICD-10-CM | POA: Diagnosis not present

## 2024-03-01 DIAGNOSIS — I4891 Unspecified atrial fibrillation: Secondary | ICD-10-CM | POA: Diagnosis not present

## 2024-03-01 DIAGNOSIS — I447 Left bundle-branch block, unspecified: Secondary | ICD-10-CM | POA: Diagnosis not present

## 2024-03-01 DIAGNOSIS — I110 Hypertensive heart disease with heart failure: Secondary | ICD-10-CM | POA: Diagnosis not present

## 2024-03-01 DIAGNOSIS — I5042 Chronic combined systolic (congestive) and diastolic (congestive) heart failure: Secondary | ICD-10-CM | POA: Diagnosis not present

## 2024-03-04 ENCOUNTER — Ambulatory Visit: Admitting: Emergency Medicine

## 2024-03-06 DIAGNOSIS — J479 Bronchiectasis, uncomplicated: Secondary | ICD-10-CM | POA: Diagnosis not present

## 2024-03-06 DIAGNOSIS — Z79890 Hormone replacement therapy (postmenopausal): Secondary | ICD-10-CM | POA: Diagnosis not present

## 2024-03-06 DIAGNOSIS — Z90710 Acquired absence of both cervix and uterus: Secondary | ICD-10-CM | POA: Diagnosis not present

## 2024-03-06 DIAGNOSIS — Z6830 Body mass index (BMI) 30.0-30.9, adult: Secondary | ICD-10-CM | POA: Diagnosis not present

## 2024-03-06 DIAGNOSIS — Z7982 Long term (current) use of aspirin: Secondary | ICD-10-CM | POA: Diagnosis not present

## 2024-03-06 DIAGNOSIS — Z602 Problems related to living alone: Secondary | ICD-10-CM | POA: Diagnosis not present

## 2024-03-06 DIAGNOSIS — F32A Depression, unspecified: Secondary | ICD-10-CM | POA: Diagnosis not present

## 2024-03-06 DIAGNOSIS — Z8572 Personal history of non-Hodgkin lymphomas: Secondary | ICD-10-CM | POA: Diagnosis not present

## 2024-03-06 DIAGNOSIS — M353 Polymyalgia rheumatica: Secondary | ICD-10-CM | POA: Diagnosis not present

## 2024-03-06 DIAGNOSIS — Z8701 Personal history of pneumonia (recurrent): Secondary | ICD-10-CM | POA: Diagnosis not present

## 2024-03-06 DIAGNOSIS — Z853 Personal history of malignant neoplasm of breast: Secondary | ICD-10-CM | POA: Diagnosis not present

## 2024-03-06 DIAGNOSIS — Z5982 Transportation insecurity: Secondary | ICD-10-CM | POA: Diagnosis not present

## 2024-03-06 DIAGNOSIS — E669 Obesity, unspecified: Secondary | ICD-10-CM | POA: Diagnosis not present

## 2024-03-06 DIAGNOSIS — I447 Left bundle-branch block, unspecified: Secondary | ICD-10-CM | POA: Diagnosis not present

## 2024-03-06 DIAGNOSIS — F419 Anxiety disorder, unspecified: Secondary | ICD-10-CM | POA: Diagnosis not present

## 2024-03-06 DIAGNOSIS — I110 Hypertensive heart disease with heart failure: Secondary | ICD-10-CM | POA: Diagnosis not present

## 2024-03-06 DIAGNOSIS — I5042 Chronic combined systolic (congestive) and diastolic (congestive) heart failure: Secondary | ICD-10-CM | POA: Diagnosis not present

## 2024-03-06 DIAGNOSIS — D509 Iron deficiency anemia, unspecified: Secondary | ICD-10-CM | POA: Diagnosis not present

## 2024-03-06 DIAGNOSIS — Z9221 Personal history of antineoplastic chemotherapy: Secondary | ICD-10-CM | POA: Diagnosis not present

## 2024-03-06 DIAGNOSIS — I4891 Unspecified atrial fibrillation: Secondary | ICD-10-CM | POA: Diagnosis not present

## 2024-03-06 DIAGNOSIS — Z7901 Long term (current) use of anticoagulants: Secondary | ICD-10-CM | POA: Diagnosis not present

## 2024-03-06 DIAGNOSIS — Z923 Personal history of irradiation: Secondary | ICD-10-CM | POA: Diagnosis not present

## 2024-03-06 DIAGNOSIS — G43909 Migraine, unspecified, not intractable, without status migrainosus: Secondary | ICD-10-CM | POA: Diagnosis not present

## 2024-03-06 DIAGNOSIS — M1990 Unspecified osteoarthritis, unspecified site: Secondary | ICD-10-CM | POA: Diagnosis not present

## 2024-03-06 DIAGNOSIS — Z8541 Personal history of malignant neoplasm of cervix uteri: Secondary | ICD-10-CM | POA: Diagnosis not present

## 2024-03-10 ENCOUNTER — Ambulatory Visit: Attending: Physician Assistant | Admitting: Physician Assistant

## 2024-03-10 VITALS — BP 110/66 | HR 82 | Ht 66.0 in | Wt 187.0 lb

## 2024-03-10 DIAGNOSIS — Z8673 Personal history of transient ischemic attack (TIA), and cerebral infarction without residual deficits: Secondary | ICD-10-CM | POA: Diagnosis not present

## 2024-03-10 DIAGNOSIS — I5022 Chronic systolic (congestive) heart failure: Secondary | ICD-10-CM | POA: Diagnosis not present

## 2024-03-10 DIAGNOSIS — I480 Paroxysmal atrial fibrillation: Secondary | ICD-10-CM

## 2024-03-10 DIAGNOSIS — I447 Left bundle-branch block, unspecified: Secondary | ICD-10-CM | POA: Diagnosis not present

## 2024-03-10 NOTE — Progress Notes (Unsigned)
 Cardiology Office Note   Date:  03/10/2024  ID:  Jilda Kress, DOB [birth year], MRN [id number] PCP: Ricky Alfrieda DASEN, DO  [facility] Providers Cardiologist:  Lonni LITTIE Nanas, MD { Click to update primary MD,subspecialty MD or APP then REFRESH:1}    History of Present Illness Melissa Sosa is a 78 y.o. female with past medical history of HFmrEF, A-fib, seizure, non-Hodgkin lymphoma s/p chemo, uterine cancer, bilateral DCIS with lumpectomy, polymyalgia rheumatica and CVA.  Echocardiogram in April 2025 showed EF 55 to 60%, normal RV she was hospitalized in July 2025 with atrial fibrillation with RVR, community-acquired pneumonia and HFmrEF.  EF was found to be 40 to 45% with moderately reduced LVEF based on echocardiogram, abnormal septal wall motion consistent with intraventricular conduction delay.  She was started on Eliquis .  She converted to sinus rhythm on diltiazem  drip.    She was readmitted to the hospital on 12/24/2023 with chest pain and shortness of breath.  EKG shows she was back in A-fib with RVR and was started on Cardizem  drip, this was later switched back to metoprolol  tartrate due to reduced EF.  Chest pain was worse when she lay flat and better when she get up and walk.  It was suspected her chest discomfort is more related to her heart failure than angina.  BNP was elevated at 1036.  Creatinine 0.92.  Hemoglobin 13.3.  Serial troponin negative x 2.  Underwent IV diuresis which significantly improved her symptoms.  Losartan  was switched to Entresto .  Differential diagnosis behind her cardiomyopathy including tachycardia, chemotherapy induced cardiotoxicity or ischemia, although it was felt that ischemia is less likely.  She spontaneously converted to sinus rhythm.  She lost 12 pounds with diuresis, discharge dry weight was 189 pounds.  Since discharge, patient has been seen by heart failure TOC clinic on 12/30/2023, metoprolol  tartrate was switched to metoprolol  succinate 50 mg  daily.  Renal function was stable.  Repeat BNP was 522.  Since discharge, patient has been readmitted back to the hospital on 02/01/2024 due to left-sided facial numbness and tingling sensation in her left hand.  Troponin was negative.  Repeat echocardiogram obtained on 02/01/2024 showed EF 40 to 45%, trivial MR, abnormal septal motion consistent with left bundle branch block.  CT of the head was negative for large vessel occlusion.  MRI showed 6 mm acute nonhemorrhagic infarct in the lateral right thalamus.  Etiology behind stroke was likely small vessel disease.  Neurology was consulted.  Patient was started on aspirin  along with Crestor  on top of Eliquis .  She was also diagnosed with likely seizure and was started on Keppra  750 mg twice a day.  Patient presents today for follow-up.  Her current weight is actually lower than the dry weight.  We discussed the recent hospitalization for stroke and that the result of the repeat echocardiogram showing her EF remain at 40 to 45%.  She appears to be euvolemic on exam.  She has not taken any as needed dose of Lasix  recently.  Her weight is quite stable at home between 185 to 187 pounds.  Heart rate is quite regular on exam.  I will discuss with her cardiologist Dr. Nanas to see if he would recommend another repeat echocardiogram in 3 months given the recent stroke or with Dr. Nanas recommend earlier ischemic workup given persistently low EF.  ROS: ***  Studies Reviewed      *** Risk Assessment/Calculations {Does this patient have ATRIAL FIBRILLATION?:[phone]}  Physical Exam VS:  BP 110/66   Pulse 82   Ht 5' 6 (1.676 m)   Wt 187 lb (84.8 kg)   SpO2 98%   BMI 30.18 kg/m        Wt Readings from Last 3 Encounters:  03/10/24 187 lb (84.8 kg)  02/06/24 190 lb (86.2 kg)  01/31/24 189 lb (85.7 kg)    GEN: Well nourished, well developed in no acute distress NECK: No JVD; No carotid bruits CARDIAC: ***RRR, no murmurs, rubs,  gallops RESPIRATORY:  Clear to auscultation without rales, wheezing or rhonchi  ABDOMEN: Soft, non-tender, non-distended EXTREMITIES:  No edema; No deformity   ASSESSMENT AND PLAN ***    {Are you ordering a CV Procedure (e.g. stress test, cath, DCCV, TEE, etc)?   Press F2        :[phone]}  Dispo: ***  Signed, Scot Ford, PA

## 2024-03-10 NOTE — Patient Instructions (Signed)
 Medication Instructions:   Your physician recommends that you continue on your current medications as directed. Please refer to the Current Medication list given to you today.  *If you need a refill on your cardiac medications before your next appointment, please call your pharmacy*   Lab Work: NONE ORDERED  TODAY     If you have labs (blood work) drawn today and your tests are completely normal, you will receive your results only by: MyChart Message (if you have MyChart) OR A paper copy in the mail If you have any lab test that is abnormal or we need to change your treatment, we will call you to review the results.    Testing/Procedures: NONE ORDERED  TODAY    Follow-Up: At [facility], you and your health needs are our priority.  As part of our continuing mission to provide you with exceptional heart care, our providers are all part of one team.  This team includes your primary Cardiologist (physician) and Advanced Practice Providers or APPs (Physician Assistants and Nurse Practitioners) who all work together to provide you with the care you need, when you need it.    Your next appointment:   3 month(s)  Provider:    Lonni LITTIE Nanas, MD     We recommend signing up for the patient portal called MyChart.  Sign up information is provided on this After Visit Summary.  MyChart is used to connect with patients for Virtual Visits (Telemedicine).  Patients are able to view lab/test results, encounter notes, upcoming appointments, etc.  Non-urgent messages can be sent to your provider as well.   To learn more about what you can do with MyChart, go to [email or web address].   Other Instructions

## 2024-03-11 ENCOUNTER — Telehealth: Payer: Self-pay | Admitting: Physician Assistant

## 2024-03-11 NOTE — Telephone Encounter (Signed)
 Pt requesting a c/b to discuss a order for her Echo.

## 2024-03-11 NOTE — Telephone Encounter (Signed)
 Patient had an echo 01/2024. He was told at appt yesterday she would need an echo 3 months from her last.   She was also very appreciative of her care yesterday.   Per Scot PA, plan to discuss case with Dr. Kate and then will follow up   Olam Ford can be contacted to schedule echo

## 2024-03-12 ENCOUNTER — Other Ambulatory Visit: Payer: Self-pay

## 2024-03-12 ENCOUNTER — Telehealth: Payer: Self-pay | Admitting: Physician Assistant

## 2024-03-12 ENCOUNTER — Telehealth: Payer: Self-pay | Admitting: Cardiology

## 2024-03-12 DIAGNOSIS — Z6830 Body mass index (BMI) 30.0-30.9, adult: Secondary | ICD-10-CM | POA: Diagnosis not present

## 2024-03-12 DIAGNOSIS — Z8541 Personal history of malignant neoplasm of cervix uteri: Secondary | ICD-10-CM | POA: Diagnosis not present

## 2024-03-12 DIAGNOSIS — Z853 Personal history of malignant neoplasm of breast: Secondary | ICD-10-CM | POA: Diagnosis not present

## 2024-03-12 DIAGNOSIS — I447 Left bundle-branch block, unspecified: Secondary | ICD-10-CM | POA: Diagnosis not present

## 2024-03-12 DIAGNOSIS — M1990 Unspecified osteoarthritis, unspecified site: Secondary | ICD-10-CM | POA: Diagnosis not present

## 2024-03-12 DIAGNOSIS — Z5982 Transportation insecurity: Secondary | ICD-10-CM | POA: Diagnosis not present

## 2024-03-12 DIAGNOSIS — G43909 Migraine, unspecified, not intractable, without status migrainosus: Secondary | ICD-10-CM | POA: Diagnosis not present

## 2024-03-12 DIAGNOSIS — I5042 Chronic combined systolic (congestive) and diastolic (congestive) heart failure: Secondary | ICD-10-CM | POA: Diagnosis not present

## 2024-03-12 DIAGNOSIS — I110 Hypertensive heart disease with heart failure: Secondary | ICD-10-CM | POA: Diagnosis not present

## 2024-03-12 DIAGNOSIS — E669 Obesity, unspecified: Secondary | ICD-10-CM | POA: Diagnosis not present

## 2024-03-12 DIAGNOSIS — J479 Bronchiectasis, uncomplicated: Secondary | ICD-10-CM | POA: Diagnosis not present

## 2024-03-12 DIAGNOSIS — Z8572 Personal history of non-Hodgkin lymphomas: Secondary | ICD-10-CM | POA: Diagnosis not present

## 2024-03-12 DIAGNOSIS — Z7982 Long term (current) use of aspirin: Secondary | ICD-10-CM | POA: Diagnosis not present

## 2024-03-12 DIAGNOSIS — Z923 Personal history of irradiation: Secondary | ICD-10-CM | POA: Diagnosis not present

## 2024-03-12 DIAGNOSIS — I519 Heart disease, unspecified: Secondary | ICD-10-CM

## 2024-03-12 DIAGNOSIS — Z8701 Personal history of pneumonia (recurrent): Secondary | ICD-10-CM | POA: Diagnosis not present

## 2024-03-12 DIAGNOSIS — F419 Anxiety disorder, unspecified: Secondary | ICD-10-CM | POA: Diagnosis not present

## 2024-03-12 DIAGNOSIS — Z90710 Acquired absence of both cervix and uterus: Secondary | ICD-10-CM | POA: Diagnosis not present

## 2024-03-12 DIAGNOSIS — Z79890 Hormone replacement therapy (postmenopausal): Secondary | ICD-10-CM | POA: Diagnosis not present

## 2024-03-12 DIAGNOSIS — Z602 Problems related to living alone: Secondary | ICD-10-CM | POA: Diagnosis not present

## 2024-03-12 DIAGNOSIS — I4891 Unspecified atrial fibrillation: Secondary | ICD-10-CM | POA: Diagnosis not present

## 2024-03-12 DIAGNOSIS — F32A Depression, unspecified: Secondary | ICD-10-CM | POA: Diagnosis not present

## 2024-03-12 DIAGNOSIS — D509 Iron deficiency anemia, unspecified: Secondary | ICD-10-CM | POA: Diagnosis not present

## 2024-03-12 DIAGNOSIS — M353 Polymyalgia rheumatica: Secondary | ICD-10-CM | POA: Diagnosis not present

## 2024-03-12 DIAGNOSIS — Z7901 Long term (current) use of anticoagulants: Secondary | ICD-10-CM | POA: Diagnosis not present

## 2024-03-12 DIAGNOSIS — Z9221 Personal history of antineoplastic chemotherapy: Secondary | ICD-10-CM | POA: Diagnosis not present

## 2024-03-12 MED ORDER — APIXABAN 5 MG PO TABS: 5.0000 mg | ORAL_TABLET | Freq: Two times a day (BID) | ORAL | 1 refills | Status: DC

## 2024-03-12 NOTE — Telephone Encounter (Signed)
*  STAT* If patient is at the pharmacy, call can be transferred to refill team.   1. Which medications need to be refilled? (please list name of each medication and dose if known)   apixaban  (ELIQUIS ) 5 MG TABS tablet   2. Would you like to learn more about the convenience, safety, & potential cost savings by using the [facility]?   3. Are you open to using the [facility] (Type [facility]. ).  4. Which pharmacy/location (including street and city if local pharmacy) is medication to be sent to?  [facility] - [place], [place] - [address]   5. Do they need a 30 day or 90 day supply?   90 day  Patient stated she still has medication but will need a refill prescription sent to get 2025 rate.

## 2024-03-12 NOTE — Telephone Encounter (Signed)
 Patient identification verified by 2 forms.   Called and spoke to patient   -Relied message from Hao.  -Pt verbalized understanding.  -Order for limited echo already in computer.  -Echo will be scheduled for Late December.               Interventions/Plan: -Follow up appointment made with Dr. Kate.   Patient agrees with plan, no questions at this time

## 2024-03-12 NOTE — Telephone Encounter (Signed)
 Prescription refill request for Eliquis  received. Indication:AFIB Last office visit:10/25 Scr:0.90  9/25 Age: 78 Weight:84.8  kg  Prescription refilled

## 2024-03-12 NOTE — Telephone Encounter (Signed)
 Attempted to call Melissa Sosa without success.  Please let her know that I have discussed her case with Dr. Kate, given her recent stroke, Dr. Kate recommend continue on heart failure therapy and to obtain limited echocardiogram in 93-month prior to her follow-up with Dr. Kate in 3 to 4 months.  Please arrange limited echocardiogram and arrange follow-up with Dr. Kate in 3 to 4 months

## 2024-03-12 NOTE — Progress Notes (Signed)
 Limited echo reordered. Old order was not active.

## 2024-03-13 NOTE — Telephone Encounter (Signed)
 I spoke with Dr. Kate yesterday and addressed it. There was a separate phone message from yesterday  Thank you.

## 2024-03-19 DIAGNOSIS — F419 Anxiety disorder, unspecified: Secondary | ICD-10-CM | POA: Diagnosis not present

## 2024-03-19 DIAGNOSIS — E669 Obesity, unspecified: Secondary | ICD-10-CM | POA: Diagnosis not present

## 2024-03-19 DIAGNOSIS — Z5982 Transportation insecurity: Secondary | ICD-10-CM | POA: Diagnosis not present

## 2024-03-19 DIAGNOSIS — Z8701 Personal history of pneumonia (recurrent): Secondary | ICD-10-CM | POA: Diagnosis not present

## 2024-03-19 DIAGNOSIS — Z9221 Personal history of antineoplastic chemotherapy: Secondary | ICD-10-CM | POA: Diagnosis not present

## 2024-03-19 DIAGNOSIS — Z79890 Hormone replacement therapy (postmenopausal): Secondary | ICD-10-CM | POA: Diagnosis not present

## 2024-03-19 DIAGNOSIS — D509 Iron deficiency anemia, unspecified: Secondary | ICD-10-CM | POA: Diagnosis not present

## 2024-03-19 DIAGNOSIS — Z923 Personal history of irradiation: Secondary | ICD-10-CM | POA: Diagnosis not present

## 2024-03-19 DIAGNOSIS — M353 Polymyalgia rheumatica: Secondary | ICD-10-CM | POA: Diagnosis not present

## 2024-03-19 DIAGNOSIS — Z7982 Long term (current) use of aspirin: Secondary | ICD-10-CM | POA: Diagnosis not present

## 2024-03-19 DIAGNOSIS — I5042 Chronic combined systolic (congestive) and diastolic (congestive) heart failure: Secondary | ICD-10-CM | POA: Diagnosis not present

## 2024-03-19 DIAGNOSIS — I4891 Unspecified atrial fibrillation: Secondary | ICD-10-CM | POA: Diagnosis not present

## 2024-03-19 DIAGNOSIS — M1990 Unspecified osteoarthritis, unspecified site: Secondary | ICD-10-CM | POA: Diagnosis not present

## 2024-03-19 DIAGNOSIS — Z853 Personal history of malignant neoplasm of breast: Secondary | ICD-10-CM | POA: Diagnosis not present

## 2024-03-19 DIAGNOSIS — Z8541 Personal history of malignant neoplasm of cervix uteri: Secondary | ICD-10-CM | POA: Diagnosis not present

## 2024-03-19 DIAGNOSIS — I447 Left bundle-branch block, unspecified: Secondary | ICD-10-CM | POA: Diagnosis not present

## 2024-03-19 DIAGNOSIS — G43909 Migraine, unspecified, not intractable, without status migrainosus: Secondary | ICD-10-CM | POA: Diagnosis not present

## 2024-03-19 DIAGNOSIS — Z90710 Acquired absence of both cervix and uterus: Secondary | ICD-10-CM | POA: Diagnosis not present

## 2024-03-19 DIAGNOSIS — I110 Hypertensive heart disease with heart failure: Secondary | ICD-10-CM | POA: Diagnosis not present

## 2024-03-19 DIAGNOSIS — Z602 Problems related to living alone: Secondary | ICD-10-CM | POA: Diagnosis not present

## 2024-03-19 DIAGNOSIS — Z8572 Personal history of non-Hodgkin lymphomas: Secondary | ICD-10-CM | POA: Diagnosis not present

## 2024-03-19 DIAGNOSIS — J479 Bronchiectasis, uncomplicated: Secondary | ICD-10-CM | POA: Diagnosis not present

## 2024-03-19 DIAGNOSIS — F32A Depression, unspecified: Secondary | ICD-10-CM | POA: Diagnosis not present

## 2024-03-19 DIAGNOSIS — Z6830 Body mass index (BMI) 30.0-30.9, adult: Secondary | ICD-10-CM | POA: Diagnosis not present

## 2024-03-19 DIAGNOSIS — Z7901 Long term (current) use of anticoagulants: Secondary | ICD-10-CM | POA: Diagnosis not present

## 2024-03-23 ENCOUNTER — Other Ambulatory Visit (HOSPITAL_COMMUNITY): Payer: Self-pay

## 2024-03-23 MED ORDER — METOPROLOL SUCCINATE ER 50 MG PO TB24: 50.0000 mg | ORAL_TABLET | Freq: Every day | ORAL | 3 refills | Status: AC

## 2024-03-23 NOTE — Progress Notes (Signed)
 Refill sent to pharmacy.

## 2024-03-26 ENCOUNTER — Other Ambulatory Visit (HOSPITAL_COMMUNITY)

## 2024-03-27 DIAGNOSIS — Z7901 Long term (current) use of anticoagulants: Secondary | ICD-10-CM | POA: Diagnosis not present

## 2024-03-27 DIAGNOSIS — M353 Polymyalgia rheumatica: Secondary | ICD-10-CM | POA: Diagnosis not present

## 2024-03-27 DIAGNOSIS — Z6830 Body mass index (BMI) 30.0-30.9, adult: Secondary | ICD-10-CM | POA: Diagnosis not present

## 2024-03-27 DIAGNOSIS — I4891 Unspecified atrial fibrillation: Secondary | ICD-10-CM | POA: Diagnosis not present

## 2024-03-27 DIAGNOSIS — Z5982 Transportation insecurity: Secondary | ICD-10-CM | POA: Diagnosis not present

## 2024-03-27 DIAGNOSIS — Z9221 Personal history of antineoplastic chemotherapy: Secondary | ICD-10-CM | POA: Diagnosis not present

## 2024-03-27 DIAGNOSIS — D509 Iron deficiency anemia, unspecified: Secondary | ICD-10-CM | POA: Diagnosis not present

## 2024-03-27 DIAGNOSIS — J479 Bronchiectasis, uncomplicated: Secondary | ICD-10-CM | POA: Diagnosis not present

## 2024-03-27 DIAGNOSIS — I5042 Chronic combined systolic (congestive) and diastolic (congestive) heart failure: Secondary | ICD-10-CM | POA: Diagnosis not present

## 2024-03-27 DIAGNOSIS — I110 Hypertensive heart disease with heart failure: Secondary | ICD-10-CM | POA: Diagnosis not present

## 2024-03-27 DIAGNOSIS — Z853 Personal history of malignant neoplasm of breast: Secondary | ICD-10-CM | POA: Diagnosis not present

## 2024-03-27 DIAGNOSIS — Z923 Personal history of irradiation: Secondary | ICD-10-CM | POA: Diagnosis not present

## 2024-03-27 DIAGNOSIS — F32A Depression, unspecified: Secondary | ICD-10-CM | POA: Diagnosis not present

## 2024-03-27 DIAGNOSIS — E669 Obesity, unspecified: Secondary | ICD-10-CM | POA: Diagnosis not present

## 2024-03-27 DIAGNOSIS — Z8701 Personal history of pneumonia (recurrent): Secondary | ICD-10-CM | POA: Diagnosis not present

## 2024-03-27 DIAGNOSIS — F419 Anxiety disorder, unspecified: Secondary | ICD-10-CM | POA: Diagnosis not present

## 2024-03-27 DIAGNOSIS — I447 Left bundle-branch block, unspecified: Secondary | ICD-10-CM | POA: Diagnosis not present

## 2024-03-27 DIAGNOSIS — M1990 Unspecified osteoarthritis, unspecified site: Secondary | ICD-10-CM | POA: Diagnosis not present

## 2024-03-27 DIAGNOSIS — Z79890 Hormone replacement therapy (postmenopausal): Secondary | ICD-10-CM | POA: Diagnosis not present

## 2024-03-27 DIAGNOSIS — Z7982 Long term (current) use of aspirin: Secondary | ICD-10-CM | POA: Diagnosis not present

## 2024-03-27 DIAGNOSIS — Z90710 Acquired absence of both cervix and uterus: Secondary | ICD-10-CM | POA: Diagnosis not present

## 2024-03-27 DIAGNOSIS — G43909 Migraine, unspecified, not intractable, without status migrainosus: Secondary | ICD-10-CM | POA: Diagnosis not present

## 2024-03-27 DIAGNOSIS — Z8541 Personal history of malignant neoplasm of cervix uteri: Secondary | ICD-10-CM | POA: Diagnosis not present

## 2024-03-27 DIAGNOSIS — Z602 Problems related to living alone: Secondary | ICD-10-CM | POA: Diagnosis not present

## 2024-03-27 DIAGNOSIS — Z8572 Personal history of non-Hodgkin lymphomas: Secondary | ICD-10-CM | POA: Diagnosis not present

## 2024-03-31 DIAGNOSIS — K529 Noninfective gastroenteritis and colitis, unspecified: Secondary | ICD-10-CM | POA: Diagnosis not present

## 2024-04-02 ENCOUNTER — Emergency Department (HOSPITAL_COMMUNITY)

## 2024-04-02 ENCOUNTER — Other Ambulatory Visit: Payer: Self-pay

## 2024-04-02 ENCOUNTER — Emergency Department (HOSPITAL_COMMUNITY)
Admission: EM | Admit: 2024-04-02 | Discharge: 2024-04-03 | Disposition: A | Discharge status: Home / Self Care | Source: Ambulatory Visit | Attending: Emergency Medicine | Admitting: Emergency Medicine

## 2024-04-02 DIAGNOSIS — Z7982 Long term (current) use of aspirin: Secondary | ICD-10-CM | POA: Diagnosis not present

## 2024-04-02 DIAGNOSIS — R109 Unspecified abdominal pain: Secondary | ICD-10-CM | POA: Diagnosis not present

## 2024-04-02 DIAGNOSIS — K529 Noninfective gastroenteritis and colitis, unspecified: Secondary | ICD-10-CM | POA: Diagnosis not present

## 2024-04-02 DIAGNOSIS — R112 Nausea with vomiting, unspecified: Secondary | ICD-10-CM | POA: Diagnosis present

## 2024-04-02 DIAGNOSIS — E860 Dehydration: Secondary | ICD-10-CM | POA: Insufficient documentation

## 2024-04-02 DIAGNOSIS — R82998 Other abnormal findings in urine: Secondary | ICD-10-CM

## 2024-04-02 DIAGNOSIS — R8281 Pyuria: Secondary | ICD-10-CM | POA: Insufficient documentation

## 2024-04-02 DIAGNOSIS — Z7901 Long term (current) use of anticoagulants: Secondary | ICD-10-CM | POA: Diagnosis not present

## 2024-04-02 LAB — CBC
HCT: 44.4 % (ref 36.0–46.0)
Hemoglobin: 14.8 g/dL (ref 12.0–15.0)
MCH: 28.4 pg (ref 26.0–34.0)
MCHC: 33.3 g/dL (ref 30.0–36.0)
MCV: 85.2 fL (ref 80.0–100.0)
Platelets: 322 K/uL (ref 150–400)
RBC: 5.21 MIL/uL — ABNORMAL HIGH (ref 3.87–5.11)
RDW: 14.6 % (ref 11.5–15.5)
WBC: 11.7 K/uL — ABNORMAL HIGH (ref 4.0–10.5)
nRBC: 0 % (ref 0.0–0.2)

## 2024-04-02 LAB — URINALYSIS, ROUTINE W REFLEX MICROSCOPIC
Bacteria, UA: NONE SEEN
Bilirubin Urine: NEGATIVE
Glucose, UA: >=500 mg/dL — AB
Hgb urine dipstick: NEGATIVE
Ketones, ur: 20 mg/dL — AB
Nitrite: NEGATIVE
Protein, ur: NEGATIVE mg/dL
Specific Gravity, Urine: 1.028 (ref 1.005–1.030)
pH: 6 (ref 5.0–8.0)

## 2024-04-02 LAB — COMPREHENSIVE METABOLIC PANEL WITH GFR
ALT: 23 U/L (ref 0–44)
AST: 29 U/L (ref 15–41)
Albumin: 3 g/dL — ABNORMAL LOW (ref 3.5–5.0)
Alkaline Phosphatase: 103 U/L (ref 38–126)
Anion gap: 13 (ref 5–15)
BUN: 17 mg/dL (ref 8–23)
CO2: 21 mmol/L — ABNORMAL LOW (ref 22–32)
Calcium: 9.2 mg/dL (ref 8.9–10.3)
Chloride: 103 mmol/L (ref 98–111)
Creatinine, Ser: 0.96 mg/dL (ref 0.44–1.00)
GFR, Estimated: >60 mL/min (ref >60)
Glucose, Bld: 122 mg/dL — ABNORMAL HIGH (ref 70–99)
Potassium: 3.7 mmol/L (ref 3.5–5.1)
Sodium: 137 mmol/L (ref 135–145)
Total Bilirubin: 0.8 mg/dL (ref 0.0–1.2)
Total Protein: 6.6 g/dL (ref 6.5–8.1)

## 2024-04-02 LAB — LIPASE, BLOOD: Lipase: 20 U/L (ref 11–51)

## 2024-04-02 MED ORDER — AMOXICILLIN-POT CLAVULANATE 875-125 MG PO TABS: 1.0000 | ORAL_TABLET | Freq: Two times a day (BID) | ORAL | 0 refills | Status: DC

## 2024-04-02 MED ORDER — LACTATED RINGERS IV BOLUS
500.0000 mL | Freq: Once | INTRAVENOUS | Status: AC
  Administered 2024-04-03: 500 mL via INTRAVENOUS

## 2024-04-02 MED ORDER — LACTATED RINGERS IV BOLUS
500.0000 mL | Freq: Once | INTRAVENOUS | Status: AC
  Administered 2024-04-02: 500 mL via INTRAVENOUS

## 2024-04-02 MED ORDER — IOHEXOL 350 MG/ML SOLN
75.0000 mL | Freq: Once | INTRAVENOUS | Status: AC | PRN
  Administered 2024-04-02: 75 mL via INTRAVENOUS

## 2024-04-02 MED ORDER — SODIUM CHLORIDE 0.9 % IV SOLN
1.0000 g | Freq: Once | INTRAVENOUS | Status: AC
  Administered 2024-04-02: 1 g via INTRAVENOUS
  Filled 2024-04-02: qty 10

## 2024-04-02 NOTE — Discharge Instructions (Signed)
 It was our pleasure to provide your ER care today - we hope that you feel better.  Drink plenty of fluids/stay well hydrated.   Take augmentin (antibiotic) as prescribed.   Follow up closely with primary care doctor in the next 3-4 days if symptoms fail to improve/resolve.  Return to ER if worse, new symptoms, severe or worsening or intractable pain, persistent vomiting, high fevers, or other concern.

## 2024-04-02 NOTE — ED Triage Notes (Signed)
 Pt arrived POV saw doc on Tuesday for soe lab work and was called this morning d/t  elevated WBC MD advised pt to come to emergency room , no fever just reports chills 9/10 leg pain Hx heart problems

## 2024-04-02 NOTE — ED Provider Notes (Signed)
 [place] EMERGENCY DEPARTMENT AT [facility] Provider Note   CSN: [id number] Arrival date & time: 04/02/24  1014     Patient presents with: Emesis, Nausea, Diarrhea, and Chills   Melissa Sosa is a 78 y.o. female.   Patient with c/o recent nausea, diarrhea illness in past few days. Diarrhea mucousy, 4-5 episodes today. No bloody bms. Nausea, decreased appetite, decreased po intake, feels dehydrated. No emesis today. C/o mid/generalized abd pain/cramping. No fevers. No known ill contacts or bad food ingestion. No recent antibiotic use. No dysuria or gu c/o.   The history is provided by the patient, medical records and a relative.  Emesis Associated symptoms: abdominal pain and diarrhea   Associated symptoms: no chills, no cough, no fever, no headaches and no sore throat   Diarrhea Associated symptoms: abdominal pain and vomiting   Associated symptoms: no chills, no fever and no headaches        Prior to Admission medications   Medication Sig Start Date End Date Taking? Authorizing Provider  amoxicillin-clavulanate (AUGMENTIN) 875-125 MG tablet Take 1 tablet by mouth 2 (two) times daily. 04/02/24  Yes Bernard Drivers, MD  acetaminophen  (TYLENOL ) 500 MG tablet Take 2 tablets (1,000 mg total) by mouth every 6 (six) hours as needed. Patient taking differently: Take 500-1,000 mg by mouth every 6 (six) hours as needed. 09/07/23   Diona Perkins, MD  apixaban  (ELIQUIS ) 5 MG TABS tablet Take 1 tablet (5 mg total) by mouth 2 (two) times daily. 03/12/24   Kate Lonni CROME, MD  aspirin  EC 81 MG tablet Take 1 tablet (81 mg total) by mouth daily. Swallow whole. 02/02/24   Laurence Locus, DO  dapagliflozin  propanediol (FARXIGA ) 10 MG TABS tablet Take 1 tablet (10 mg total) by mouth daily. 02/07/24   Bensimhon, Toribio SAUNDERS, MD  furosemide  (LASIX ) 40 MG tablet Take one tablet  (40 mg) for weight gain greater than 3 pounds in 24 hours or 5 pounds in a week. 12/30/23   Colletta Manuelita Garre, PA-C   levETIRAcetam  (KEPPRA ) 750 MG tablet Take 1 tablet (750 mg total) by mouth 2 (two) times daily. 02/06/24 05/01/25  Camara, Amadou, MD  metoprolol  succinate (TOPROL -XL) 50 MG 24 hr tablet Take 1 tablet (50 mg total) by mouth daily. Take with or immediately following a meal. 03/23/24   Bensimhon, Toribio SAUNDERS, MD  Multiple Vitamin (MULTIVITAMIN) tablet Take 1 tablet by mouth daily.    [provider]  nortriptyline  (PAMELOR ) 25 MG capsule Take 25 mg by mouth at bedtime.    [provider]  nystatin (MYCOSTATIN/NYSTOP) powder Apply 1 Application topically See admin instructions. Application Topical 2-3 Times Daily 01/28/24   [provider]  rosuvastatin  (CRESTOR ) 20 MG tablet Take 1 tablet (20 mg total) by mouth daily. 02/02/24 05/02/24  Laurence Locus, DO  sacubitril -valsartan  (ENTRESTO ) 24-26 MG Take 1 tablet by mouth 2 (two) times daily. 01/16/24   Colletta Manuelita Garre, PA-C    Allergies: Sulfamethoxazole-trimethoprim, Venlafaxine, Imitrex [sumatriptan], and Morphine and codeine    Review of Systems  Constitutional:  Negative for chills and fever.  HENT:  Negative for sore throat.   Eyes:  Negative for redness.  Respiratory:  Negative for cough and shortness of breath.   Cardiovascular:  Negative for chest pain.  Gastrointestinal:  Positive for abdominal pain, diarrhea, nausea and vomiting.  Genitourinary:  Negative for dysuria and flank pain.  Musculoskeletal:  Negative for back pain and neck pain.  Skin:  Negative for rash.  Neurological:  Negative for headaches.    Updated Vital Signs BP (!) 108/50   Pulse 91   Temp (!) 97.5 F (36.4 C) (Oral)   Resp 20   SpO2 97%   Physical Exam Vitals and nursing note reviewed.  Constitutional:      Appearance: Normal appearance. She is well-developed.  HENT:     Head: Atraumatic.     Nose: Nose normal.     Mouth/Throat:     Mouth: Mucous membranes are moist.     Pharynx: Oropharynx is clear.  Eyes:     General: No  scleral icterus.    Conjunctiva/sclera: Conjunctivae normal.     Pupils: Pupils are equal, round, and reactive to light.  Neck:     Trachea: No tracheal deviation.     Comments: No stiffness or rigidity Cardiovascular:     Rate and Rhythm: Normal rate and regular rhythm.     Pulses: Normal pulses.     Heart sounds: Normal heart sounds. No murmur heard.    No friction rub. No gallop.  Pulmonary:     Effort: Pulmonary effort is normal. No respiratory distress.     Breath sounds: Normal breath sounds.  Abdominal:     General: Bowel sounds are normal. There is no distension.     Palpations: Abdomen is soft.     Tenderness: There is abdominal tenderness. There is no guarding.  Genitourinary:    Comments: No cva tenderness.  Musculoskeletal:        General: No swelling or tenderness.     Cervical back: Normal range of motion and neck supple. No rigidity. No muscular tenderness.  Skin:    General: Skin is warm and dry.     Findings: No rash.  Neurological:     Mental Status: She is alert.     Comments: Alert, speech normal. Motor/sens grossly intact bil.   Psychiatric:        Mood and Affect: Mood normal.     (all labs ordered are listed, but only abnormal results are displayed) Results for orders placed or performed during the hospital encounter of 04/02/24  Lipase, blood   Collection Time: 04/02/24 11:43 AM  Result Value Ref Range   Lipase 20 11 - 51 U/L  Comprehensive metabolic panel   Collection Time: 04/02/24 11:43 AM  Result Value Ref Range   Sodium 137 135 - 145 mmol/L   Potassium 3.7 3.5 - 5.1 mmol/L   Chloride 103 98 - 111 mmol/L   CO2 21 (L) 22 - 32 mmol/L   Glucose, Bld 122 (H) 70 - 99 mg/dL   BUN 17 8 - 23 mg/dL   Creatinine, Ser 9.03 0.44 - 1.00 mg/dL   Calcium  9.2 8.9 - 10.3 mg/dL   Total Protein 6.6 6.5 - 8.1 g/dL   Albumin 3.0 (L) 3.5 - 5.0 g/dL   AST 29 15 - 41 U/L   ALT 23 0 - 44 U/L   Alkaline Phosphatase 103 38 - 126 U/L   Total Bilirubin 0.8 0.0 -  1.2 mg/dL   GFR, Estimated >39 >39 mL/min   Anion gap 13 5 - 15  CBC   Collection Time: 04/02/24 11:43 AM  Result Value Ref Range   WBC 11.7 (H) 4.0 - 10.5 K/uL   RBC 5.21 (H) 3.87 - 5.11 MIL/uL   Hemoglobin 14.8 12.0 - 15.0 g/dL   HCT 55.5 63.9 - 53.9 %   MCV 85.2 80.0 - 100.0 fL   MCH 28.4 26.0 -  34.0 pg   MCHC 33.3 30.0 - 36.0 g/dL   RDW 85.3 88.4 - 84.4 %   Platelets 322 150 - 400 K/uL   nRBC 0.0 0.0 - 0.2 %  Urinalysis, Routine w reflex microscopic -Urine, Clean Catch   Collection Time: 04/02/24 10:29 PM  Result Value Ref Range   Color, Urine YELLOW YELLOW   APPearance HAZY (A) CLEAR   Specific Gravity, Urine 1.028 1.005 - 1.030   pH 6.0 5.0 - 8.0   Glucose, UA >=500 (A) NEGATIVE mg/dL   Hgb urine dipstick NEGATIVE NEGATIVE   Bilirubin Urine NEGATIVE NEGATIVE   Ketones, ur 20 (A) NEGATIVE mg/dL   Protein, ur NEGATIVE NEGATIVE mg/dL   Nitrite NEGATIVE NEGATIVE   Leukocytes,Ua SMALL (A) NEGATIVE   RBC / HPF 0-5 0 - 5 RBC/hpf   WBC, UA 11-20 0 - 5 WBC/hpf   Bacteria, UA NONE SEEN NONE SEEN   Squamous Epithelial / HPF 6-10 0 - 5 /HPF   Mucus PRESENT       EKG: None  Radiology: CT ABDOMEN PELVIS W CONTRAST Result Date: 04/02/2024 EXAM: CT ABDOMEN AND PELVIS WITH CONTRAST 04/02/2024 08:21:23 PM TECHNIQUE: CT of the abdomen and pelvis was performed with the administration of 75 mL of iohexol  (OMNIPAQUE ) 350 MG/ML injection. Multiplanar reformatted images are provided for review. Automated exposure control, iterative reconstruction, and/or weight-based adjustment of the mA/kV was utilized to reduce the radiation dose to as low as reasonably achievable. COMPARISON: 11/07/2020 CLINICAL HISTORY: Abdominal pain, acute, nonlocalized. FINDINGS: LOWER CHEST: Left lower lobe scarring. LIVER: The liver is unremarkable. GALLBLADDER AND BILE DUCTS: Cholecystectomy. No biliary ductal dilatation. SPLEEN: No acute abnormality. PANCREAS: No acute abnormality. ADRENAL GLANDS: No acute  abnormality. KIDNEYS, URETERS AND BLADDER: No stones in the kidneys or ureters. No hydronephrosis. No perinephric or periureteral stranding. Urinary bladder is unremarkable. GI AND BOWEL: Stomach demonstrates no acute abnormality. Mild wall thickening versus underdistention of the proximal transverse colon and descending colon . There is no bowel obstruction. PERITONEUM AND RETROPERITONEUM: No ascites. No free air. VASCULATURE: Aorta is normal in caliber. Aortic atherosclerosis. LYMPH NODES: No lymphadenopathy. REPRODUCTIVE ORGANS: Hysterectomy. BONES AND SOFT TISSUES: No acute osseous abnormality. No focal soft tissue abnormality. IMPRESSION: 1. Possible mild colitis versus underdistention of the proximal transverse colon and descending colon. Electronically signed by: Norman Gatlin MD 04/02/2024 08:27 PM EST RP Workstation: [id number]     Procedures   Medications Ordered in the ED  cefTRIAXone  (ROCEPHIN ) 1 g in sodium chloride  0.9 % 100 mL IVPB (1 g Intravenous New Bag/Given 04/02/24 2342)  lactated ringers  bolus 500 mL (has no administration in time range)  lactated ringers  bolus 500 mL (500 mLs Intravenous Bolus from Bag 04/02/24 1937)  iohexol  (OMNIPAQUE ) 350 MG/ML injection 75 mL (75 mLs Intravenous Contrast Given 04/02/24 2022)  lactated ringers  bolus 500 mL (500 mLs Intravenous New Bag/Given 04/02/24 2255)                                    Medical Decision Making Problems Addressed: Colitis: acute illness or injury Dehydration: acute illness or injury with systemic symptoms that poses a threat to life or bodily functions Nausea vomiting and diarrhea: acute illness or injury with systemic symptoms that poses a threat to life or bodily functions Urine white blood cells increased: acute illness or injury  Amount and/or Complexity of Data Reviewed Independent Historian:     Details: Family,  hx External Data Reviewed: notes. Labs: ordered. Decision-making details documented in ED  Course. Radiology: ordered and independent interpretation performed. Decision-making details documented in ED Course.  Risk Prescription drug management. Decision regarding hospitalization.  Iv ns. Continuous pulse ox and cardiac monitoring. Labs ordered/sent. Imaging ordered.   Differential diagnosis includes dehydration, gastroenteritis, food poisoning, diverticulitis, etc . Dispo decision including potential need for admission considered - will get labs and imaging and reassess.   Reviewed nursing notes and prior charts for additional history. External reports reviewed. Additional history from: family.  LR bolus.   Cardiac monitor: sinus rhythm, rate  Labs reviewed/interpreted by me - wbc mildly elev. Hgb normal. Chem largely unremarkable, hco3 sl low, ?dehydration, ivf. Ua w a few wbc, no dysuria. No cva or flank pain/tenderness.   CT reviewed/interpreted by me - ?mild colitis  Bp normal. No nv. Abd soft nt. Pt appears stable for ED d/c.   Rec close pcp f/u.  Return precautions provided.       Final diagnoses:  Nausea vomiting and diarrhea  Dehydration  Colitis  Urine white blood cells increased    ED Discharge Orders          Ordered    amoxicillin-clavulanate (AUGMENTIN) 875-125 MG tablet  2 times daily        04/02/24 2341               Karena Kinker, MD 04/02/24 2345

## 2024-04-03 DIAGNOSIS — R112 Nausea with vomiting, unspecified: Secondary | ICD-10-CM | POA: Diagnosis not present

## 2024-04-04 ENCOUNTER — Emergency Department (HOSPITAL_COMMUNITY)

## 2024-04-04 ENCOUNTER — Encounter (HOSPITAL_COMMUNITY): Payer: Self-pay

## 2024-04-04 ENCOUNTER — Other Ambulatory Visit: Payer: Self-pay

## 2024-04-04 ENCOUNTER — Emergency Department (HOSPITAL_COMMUNITY)
Admission: EM | Admit: 2024-04-04 | Discharge: 2024-04-04 | Disposition: A | Discharge status: Home / Self Care | Attending: Emergency Medicine | Admitting: Emergency Medicine

## 2024-04-04 DIAGNOSIS — R1111 Vomiting without nausea: Secondary | ICD-10-CM | POA: Diagnosis not present

## 2024-04-04 DIAGNOSIS — Z043 Encounter for examination and observation following other accident: Secondary | ICD-10-CM | POA: Diagnosis not present

## 2024-04-04 DIAGNOSIS — I509 Heart failure, unspecified: Secondary | ICD-10-CM | POA: Insufficient documentation

## 2024-04-04 DIAGNOSIS — R197 Diarrhea, unspecified: Secondary | ICD-10-CM | POA: Diagnosis not present

## 2024-04-04 DIAGNOSIS — E876 Hypokalemia: Secondary | ICD-10-CM | POA: Diagnosis not present

## 2024-04-04 DIAGNOSIS — R110 Nausea: Secondary | ICD-10-CM | POA: Diagnosis not present

## 2024-04-04 DIAGNOSIS — I6782 Cerebral ischemia: Secondary | ICD-10-CM | POA: Diagnosis not present

## 2024-04-04 DIAGNOSIS — Z7901 Long term (current) use of anticoagulants: Secondary | ICD-10-CM | POA: Diagnosis not present

## 2024-04-04 DIAGNOSIS — W19XXXA Unspecified fall, initial encounter: Secondary | ICD-10-CM

## 2024-04-04 DIAGNOSIS — Z7982 Long term (current) use of aspirin: Secondary | ICD-10-CM | POA: Diagnosis not present

## 2024-04-04 DIAGNOSIS — S199XXA Unspecified injury of neck, initial encounter: Secondary | ICD-10-CM | POA: Diagnosis not present

## 2024-04-04 DIAGNOSIS — S0993XA Unspecified injury of face, initial encounter: Secondary | ICD-10-CM | POA: Diagnosis present

## 2024-04-04 DIAGNOSIS — S0083XA Contusion of other part of head, initial encounter: Secondary | ICD-10-CM | POA: Diagnosis not present

## 2024-04-04 DIAGNOSIS — S0990XA Unspecified injury of head, initial encounter: Secondary | ICD-10-CM | POA: Diagnosis not present

## 2024-04-04 DIAGNOSIS — W010XXA Fall on same level from slipping, tripping and stumbling without subsequent striking against object, initial encounter: Secondary | ICD-10-CM | POA: Diagnosis not present

## 2024-04-04 DIAGNOSIS — M47812 Spondylosis without myelopathy or radiculopathy, cervical region: Secondary | ICD-10-CM | POA: Diagnosis not present

## 2024-04-04 LAB — COMPREHENSIVE METABOLIC PANEL WITH GFR
ALT: 19 U/L (ref 0–44)
AST: 27 U/L (ref 15–41)
Albumin: 2.7 g/dL — ABNORMAL LOW (ref 3.5–5.0)
Alkaline Phosphatase: 85 U/L (ref 38–126)
Anion gap: 19 — ABNORMAL HIGH (ref 5–15)
BUN: 13 mg/dL (ref 8–23)
CO2: 18 mmol/L — ABNORMAL LOW (ref 22–32)
Calcium: 8.8 mg/dL — ABNORMAL LOW (ref 8.9–10.3)
Chloride: 102 mmol/L (ref 98–111)
Creatinine, Ser: 0.96 mg/dL (ref 0.44–1.00)
GFR, Estimated: >60 mL/min (ref >60)
Glucose, Bld: 126 mg/dL — ABNORMAL HIGH (ref 70–99)
Potassium: 3.2 mmol/L — ABNORMAL LOW (ref 3.5–5.1)
Sodium: 139 mmol/L (ref 135–145)
Total Bilirubin: 0.8 mg/dL (ref 0.0–1.2)
Total Protein: 6 g/dL — ABNORMAL LOW (ref 6.5–8.1)

## 2024-04-04 LAB — CBC WITH DIFFERENTIAL/PLATELET
Basophils Absolute: 0.3 K/uL — ABNORMAL HIGH (ref 0.0–0.1)
Basophils Relative: 2 %
Eosinophils Absolute: 0 K/uL (ref 0.0–0.5)
Eosinophils Relative: 0 %
HCT: 42.2 % (ref 36.0–46.0)
Hemoglobin: 14.2 g/dL (ref 12.0–15.0)
Lymphocytes Relative: 11 %
Lymphs Abs: 1.4 K/uL (ref 0.7–4.0)
MCH: 28.4 pg (ref 26.0–34.0)
MCHC: 33.6 g/dL (ref 30.0–36.0)
MCV: 84.4 fL (ref 80.0–100.0)
Monocytes Absolute: 1.5 K/uL — ABNORMAL HIGH (ref 0.1–1.0)
Monocytes Relative: 12 %
Neutro Abs: 9.6 K/uL — ABNORMAL HIGH (ref 1.7–7.7)
Neutrophils Relative %: 75 %
Platelets: 281 K/uL (ref 150–400)
RBC: 5 MIL/uL (ref 3.87–5.11)
RDW: 14.8 % (ref 11.5–15.5)
WBC: 12.8 K/uL — ABNORMAL HIGH (ref 4.0–10.5)
nRBC: 0 % (ref 0.0–0.2)

## 2024-04-04 LAB — C DIFFICILE QUICK SCREEN W PCR REFLEX
C Diff antigen: POSITIVE — AB
C Diff toxin: NEGATIVE

## 2024-04-04 LAB — CLOSTRIDIUM DIFFICILE BY PCR, REFLEXED
Hypervirulent Strain: NEGATIVE
Toxigenic C. Difficile by PCR: POSITIVE — AB

## 2024-04-04 LAB — MAGNESIUM: Magnesium: 1.8 mg/dL (ref 1.7–2.4)

## 2024-04-04 MED ORDER — POTASSIUM CHLORIDE CRYS ER 20 MEQ PO TBCR
20.0000 meq | EXTENDED_RELEASE_TABLET | Freq: Once | ORAL | Status: AC
  Administered 2024-04-04: 20 meq via ORAL
  Filled 2024-04-04: qty 1

## 2024-04-04 NOTE — ED Notes (Signed)
 CCMD called for cardiac monitoring.

## 2024-04-04 NOTE — ED Triage Notes (Signed)
 Arrives [facility] from home after tripping and falling while exiting the shower and hit her head. Hematoma on head.   Compliant on Eliquis .   Paramedics report fluctuation of blood pressures while in transport.   She also says she has been having diarrhea lately. Seen yesterday and dx with Colitis.

## 2024-04-04 NOTE — ED Provider Notes (Signed)
 [place] EMERGENCY DEPARTMENT AT [facility] Provider Note   CSN: [id number] Arrival date & time: 04/04/24  9573     Patient presents with: No chief complaint on file.   Melissa Sosa is a 78 y.o. female.   The history is provided by the patient, the EMS personnel and medical records.  Melissa Sosa is a 78 y.o. female who presents to the Emergency Department complaining of fall.  She presents to the emergency department by EMS for evaluation of injuries following a fall.  She states that she lives at home alone and she was trying to clean up from having stool on her close and was try to get out of the shower when she slipped on the wet floor and she put her arms out to try and catch herself but was unable to catch herself and she struck her head.  She did not lose consciousness.  She did not have a phone with her or a call bell with her so she had to crawl on the floor to call for assistance.  She does report nonbloody diarrhea today.  1 week ago she had fevers with diarrhea described as mucus.  This overall improved and then returned.  No abdominal pain, nausea, vomiting, chest pain, shortness of breath.  She does take Eliquis  for history of CHF and is compliant with her medications. She reports feeling slightly weak in her arms, no neck pain.    Prior to Admission medications   Medication Sig Start Date End Date Taking? Authorizing Provider  acetaminophen  (TYLENOL ) 500 MG tablet Take 2 tablets (1,000 mg total) by mouth every 6 (six) hours as needed. Patient taking differently: Take 500-1,000 mg by mouth every 6 (six) hours as needed. 09/07/23   Diona Perkins, MD  amoxicillin-clavulanate (AUGMENTIN) 875-125 MG tablet Take 1 tablet by mouth 2 (two) times daily. 04/02/24   Bernard Drivers, MD  apixaban  (ELIQUIS ) 5 MG TABS tablet Take 1 tablet (5 mg total) by mouth 2 (two) times daily. 03/12/24   Kate Lonni CROME, MD  aspirin  EC 81 MG tablet Take 1 tablet (81 mg total) by mouth  daily. Swallow whole. 02/02/24   Laurence Locus, DO  dapagliflozin  propanediol (FARXIGA ) 10 MG TABS tablet Take 1 tablet (10 mg total) by mouth daily. 02/07/24   Bensimhon, Toribio SAUNDERS, MD  furosemide  (LASIX ) 40 MG tablet Take one tablet  (40 mg) for weight gain greater than 3 pounds in 24 hours or 5 pounds in a week. 12/30/23   Colletta Manuelita Garre, PA-C  levETIRAcetam  (KEPPRA ) 750 MG tablet Take 1 tablet (750 mg total) by mouth 2 (two) times daily. 02/06/24 05/01/25  Camara, Amadou, MD  metoprolol  succinate (TOPROL -XL) 50 MG 24 hr tablet Take 1 tablet (50 mg total) by mouth daily. Take with or immediately following a meal. 03/23/24   Bensimhon, Toribio SAUNDERS, MD  Multiple Vitamin (MULTIVITAMIN) tablet Take 1 tablet by mouth daily.    [provider]  nortriptyline  (PAMELOR ) 25 MG capsule Take 25 mg by mouth at bedtime.    [provider]  nystatin (MYCOSTATIN/NYSTOP) powder Apply 1 Application topically See admin instructions. Application Topical 2-3 Times Daily 01/28/24   [provider]  rosuvastatin  (CRESTOR ) 20 MG tablet Take 1 tablet (20 mg total) by mouth daily. 02/02/24 05/02/24  Laurence Locus, DO  sacubitril -valsartan  (ENTRESTO ) 24-26 MG Take 1 tablet by mouth 2 (two) times daily. 01/16/24   Colletta Manuelita Garre, PA-C    Allergies: Sulfamethoxazole-trimethoprim, Venlafaxine, Imitrex [sumatriptan], and Morphine  and codeine    Review of Systems  All other systems reviewed and are negative.   Updated Vital Signs BP 112/74 (BP Location: Left Arm)   Pulse 89   Temp 98.3 F (36.8 C) (Oral)   Resp 14   Ht 5' 6 (1.676 m)   Wt 83.9 kg   SpO2 94%   BMI 29.86 kg/m   Physical Exam Vitals and nursing note reviewed.  Constitutional:      Appearance: She is well-developed.  HENT:     Head: Normocephalic.     Comments: Soft tissue swelling to central forehead Cardiovascular:     Rate and Rhythm: Normal rate and regular rhythm.     Heart sounds: No murmur heard. Pulmonary:      Effort: Pulmonary effort is normal. No respiratory distress.     Breath sounds: Normal breath sounds.  Abdominal:     Palpations: Abdomen is soft.     Tenderness: There is no abdominal tenderness. There is no guarding or rebound.  Musculoskeletal:        General: No tenderness.     Cervical back: No tenderness.  Skin:    General: Skin is warm and dry.  Neurological:     Mental Status: She is alert and oriented to person, place, and time.     Comments: No asymmetry of facial movements.  5 out of 5 strength in all 4 extremities with sensation to light touch intact in all 4 extremities  Psychiatric:        Behavior: Behavior normal.     (all labs ordered are listed, but only abnormal results are displayed) Labs Reviewed  COMPREHENSIVE METABOLIC PANEL WITH GFR - Abnormal; Notable for the following components:      Result Value   Potassium 3.2 (*)    CO2 18 (*)    Glucose, Bld 126 (*)    Calcium  8.8 (*)    Total Protein 6.0 (*)    Albumin 2.7 (*)    Anion gap 19 (*)    All other components within normal limits  CBC WITH DIFFERENTIAL/PLATELET - Abnormal; Notable for the following components:   WBC 12.8 (*)    Neutro Abs 9.6 (*)    Monocytes Absolute 1.5 (*)    Basophils Absolute 0.3 (*)    All other components within normal limits  GASTROINTESTINAL PANEL BY PCR, STOOL (REPLACES STOOL CULTURE)  C DIFFICILE QUICK SCREEN W PCR REFLEX    MAGNESIUM     EKG: None  Radiology: DG Pelvis Portable Result Date: 04/04/2024 EXAM: 1 or 2 VIEW(S) XRAY OF THE PELVIS 04/04/2024 05:16:00 AM COMPARISON: None available. CLINICAL HISTORY: Fall Fall FINDINGS: BONES AND JOINTS: No acute fracture. No focal osseous lesion. No joint dislocation. SOFT TISSUES: The soft tissues are unremarkable. IMPRESSION: 1. No significant abnormality. Electronically signed by: Evalene Coho MD 04/04/2024 05:22 AM EST RP Workstation: [id number]   DG Chest Port 1 View Result Date: 04/04/2024 EXAM: 1 VIEW(S) XRAY  OF THE CHEST 04/04/2024 05:16:00 AM COMPARISON: AP radiograph of the chest dated 12/24/2023. CLINICAL HISTORY: fall FINDINGS: LUNGS AND PLEURA: No focal pulmonary opacity. No pleural effusion. No pneumothorax. HEART AND MEDIASTINUM: No acute abnormality of the cardiac and mediastinal silhouettes. BONES AND SOFT TISSUES: No acute osseous abnormality. IMPRESSION: 1. No acute process. Electronically signed by: Evalene Coho MD 04/04/2024 05:22 AM EST RP Workstation: [id number]   CT Cervical Spine Wo Contrast Result Date: 04/04/2024 CLINICAL DATA:  Tripped and fell exiting the shower.  Head injury. EXAM: CT  CERVICAL SPINE WITHOUT CONTRAST TECHNIQUE: Multidetector CT imaging of the cervical spine was performed without intravenous contrast. Multiplanar CT image reconstructions were also generated. RADIATION DOSE REDUCTION: This exam was performed according to the departmental dose-optimization program which includes automated exposure control, adjustment of the mA and/or kV according to patient size and/or use of iterative reconstruction technique. COMPARISON:  None Available. FINDINGS: Alignment: Straightening of normal cervical lordosis without evidence for traumatic subluxation. Skull base and vertebrae: No acute fracture. No primary bone lesion or focal pathologic process. Soft tissues and spinal canal: No prevertebral fluid or swelling. No visible canal hematoma. Disc levels: Marked loss of disc height noted at C4-5, C5-6 and C6-7 with endplate degeneration. 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 Upper chest: No acute findings Other: None. IMPRESSION: 1. No acute fracture or traumatic subluxation of the cervical spine. 2. Degenerative changes in the mid and lower cervical spine. Electronically Signed   By: Camellia Candle M.D.   On: 04/04/2024 05:16   CT Head Wo Contrast Result Date: 04/04/2024 CLINICAL DATA:  Patient tripped and fell. Head injury. Chronic anticoagulation. EXAM: CT  HEAD WITHOUT CONTRAST TECHNIQUE: Contiguous axial images were obtained from the base of the skull through the vertex without intravenous contrast. RADIATION DOSE REDUCTION: This exam was performed according to the departmental dose-optimization program which includes automated exposure control, adjustment of the mA and/or kV according to patient size and/or use of iterative reconstruction technique. COMPARISON:  02/01/2024 FINDINGS: Brain: There is no evidence for acute hemorrhage, hydrocephalus, mass lesion, or abnormal extra-axial fluid collection. No definite CT evidence for acute infarction. Patchy low attenuation in the deep hemispheric and periventricular white matter is nonspecific, but likely reflects chronic microvascular ischemic demyelination. Vascular: No hyperdense vessel or unexpected calcification. Skull: No evidence for fracture. No worrisome lytic or sclerotic lesion. Sinuses/Orbits: The visualized paranasal sinuses and mastoid air cells are clear. Visualized portions of the globes and intraorbital fat are unremarkable. Other: None. IMPRESSION: 1. No acute intracranial abnormality. 2. Chronic small vessel ischemic disease. Electronically Signed   By: Camellia Candle M.D.   On: 04/04/2024 05:09   CT ABDOMEN PELVIS W CONTRAST Result Date: 04/02/2024 EXAM: CT ABDOMEN AND PELVIS WITH CONTRAST 04/02/2024 08:21:23 PM TECHNIQUE: CT of the abdomen and pelvis was performed with the administration of 75 mL of iohexol  (OMNIPAQUE ) 350 MG/ML injection. Multiplanar reformatted images are provided for review. Automated exposure control, iterative reconstruction, and/or weight-based adjustment of the mA/kV was utilized to reduce the radiation dose to as low as reasonably achievable. COMPARISON: 11/07/2020 CLINICAL HISTORY: Abdominal pain, acute, nonlocalized. FINDINGS: LOWER CHEST: Left lower lobe scarring. LIVER: The liver is unremarkable. GALLBLADDER AND BILE DUCTS: Cholecystectomy. No biliary ductal  dilatation. SPLEEN: No acute abnormality. PANCREAS: No acute abnormality. ADRENAL GLANDS: No acute abnormality. KIDNEYS, URETERS AND BLADDER: No stones in the kidneys or ureters. No hydronephrosis. No perinephric or periureteral stranding. Urinary bladder is unremarkable. GI AND BOWEL: Stomach demonstrates no acute abnormality. Mild wall thickening versus underdistention of the proximal transverse colon and descending colon . There is no bowel obstruction. PERITONEUM AND RETROPERITONEUM: No ascites. No free air. VASCULATURE: Aorta is normal in caliber. Aortic atherosclerosis. LYMPH NODES: No lymphadenopathy. REPRODUCTIVE ORGANS: Hysterectomy. BONES AND SOFT TISSUES: No acute osseous abnormality. No focal soft tissue abnormality. IMPRESSION: 1. Possible mild colitis versus underdistention of the proximal transverse colon and descending colon. Electronically signed by: Norman Gatlin MD 04/02/2024 08:27 PM EST RP Workstation: [id number]     Procedures   Medications Ordered  in the ED  potassium chloride  SA (KLOR-CON  M) CR tablet 20 mEq (20 mEq Oral Given 04/04/24 0606)                                    Medical Decision Making Amount and/or Complexity of Data Reviewed Labs: ordered. Radiology: ordered.  Risk Prescription drug management.   Patient here for evaluation following a slip and fall, does have soft tissue swelling to her forehead on examination.  She is anticoagulated for history of atrial fibrillation.  Imaging is negative for significant intracranial abnormality or cervical spine injury.  Labs with mild hypokalemia-this was replaced orally.  CBC is stable.  She does have mild leukocytosis.  She did start her Augmentin, discussed that she may continue this.  Will try to obtain a stool sample if patient is able to provide one.  She is able to ambulate the department without difficulty and ambulate at her baseline.  Feel she is stable for discharge home with outpatient follow-up and  return precautions.  Daughter-in-law was at the bedside for discussion of findings of studies and treatment plan.     Final diagnoses:  Fall, initial encounter  Contusion of face, initial encounter  Hypokalemia    ED Discharge Orders     None          Griselda Norris, MD 04/04/24 [phone]

## 2024-04-04 NOTE — Progress Notes (Signed)
   04/04/24 0532  Spiritual Encounters  Type of Visit Initial  Care provided to: Pt and family  Conversation partners present during encounter Nurse  Reason for visit Trauma  OnCall Visit Yes   Responded to trauma level 2 due to FOT. Daughter at bedside.  Patient being discharged. Chaplain not needed at this time.

## 2024-04-04 NOTE — Progress Notes (Addendum)
 Orthopedic Tech Progress Note Patient Details:  Nethra Mehlberg 1945-05-28 [id number] Level II trauma FOT  no ortho tech orders at this time.  Patient ID: Laylana Gerwig, female   DOB: April 13, 1946, 78 y.o.   MRN: [id number]  Giovanni LITTIE Lukes 04/04/2024, 5:38 AM

## 2024-04-05 DIAGNOSIS — S0083XD Contusion of other part of head, subsequent encounter: Secondary | ICD-10-CM | POA: Diagnosis not present

## 2024-04-05 LAB — GASTROINTESTINAL PANEL BY PCR, STOOL (REPLACES STOOL CULTURE)

## 2024-04-07 ENCOUNTER — Ambulatory Visit: Admitting: Neurology

## 2024-04-09 ENCOUNTER — Ambulatory Visit (HOSPITAL_COMMUNITY): Payer: Self-pay

## 2024-04-10 ENCOUNTER — Telehealth (HOSPITAL_COMMUNITY): Payer: Self-pay

## 2024-04-10 NOTE — Telephone Encounter (Signed)
 Contacted pt. And 2 identifiers used.  Informed of results.  Continue to follow discharge instructions.  All questions answered and pt verbalized understanding.  Pt. Reports feeling better and diarrhea has improved, denies any fevers . She will inform her PCP.

## 2024-04-13 DIAGNOSIS — R509 Fever, unspecified: Secondary | ICD-10-CM | POA: Diagnosis not present

## 2024-04-18 ENCOUNTER — Emergency Department (HOSPITAL_BASED_OUTPATIENT_CLINIC_OR_DEPARTMENT_OTHER)
Admission: EM | Admit: 2024-04-18 | Discharge: 2024-04-18 | Disposition: A | Discharge status: Home / Self Care | Attending: Emergency Medicine | Admitting: Emergency Medicine

## 2024-04-18 ENCOUNTER — Other Ambulatory Visit: Payer: Self-pay

## 2024-04-18 ENCOUNTER — Other Ambulatory Visit (HOSPITAL_BASED_OUTPATIENT_CLINIC_OR_DEPARTMENT_OTHER): Payer: Self-pay

## 2024-04-18 ENCOUNTER — Encounter (HOSPITAL_BASED_OUTPATIENT_CLINIC_OR_DEPARTMENT_OTHER): Payer: Self-pay | Admitting: Emergency Medicine

## 2024-04-18 DIAGNOSIS — Z7901 Long term (current) use of anticoagulants: Secondary | ICD-10-CM | POA: Diagnosis not present

## 2024-04-18 DIAGNOSIS — R197 Diarrhea, unspecified: Secondary | ICD-10-CM | POA: Diagnosis not present

## 2024-04-18 DIAGNOSIS — Z7982 Long term (current) use of aspirin: Secondary | ICD-10-CM | POA: Diagnosis not present

## 2024-04-18 DIAGNOSIS — A0472 Enterocolitis due to Clostridium difficile, not specified as recurrent: Secondary | ICD-10-CM | POA: Diagnosis not present

## 2024-04-18 HISTORY — DX: Heart failure, unspecified: I50.9

## 2024-04-18 LAB — CBC WITH DIFFERENTIAL/PLATELET
Abs Immature Granulocytes: 0.02 K/uL (ref 0.00–0.07)
Basophils Absolute: 0 K/uL (ref 0.0–0.1)
Basophils Relative: 1 %
Eosinophils Absolute: 0.1 K/uL (ref 0.0–0.5)
Eosinophils Relative: 2 %
HCT: 39.9 % (ref 36.0–46.0)
Hemoglobin: 13.4 g/dL (ref 12.0–15.0)
Immature Granulocytes: 0 %
Lymphocytes Relative: 23 %
Lymphs Abs: 1.5 K/uL (ref 0.7–4.0)
MCH: 28.3 pg (ref 26.0–34.0)
MCHC: 33.6 g/dL (ref 30.0–36.0)
MCV: 84.4 fL (ref 80.0–100.0)
Monocytes Absolute: 0.9 K/uL (ref 0.1–1.0)
Monocytes Relative: 14 %
Neutro Abs: 3.9 K/uL (ref 1.7–7.7)
Neutrophils Relative %: 60 %
Platelets: 403 K/uL — ABNORMAL HIGH (ref 150–400)
RBC: 4.73 MIL/uL (ref 3.87–5.11)
RDW: 15.2 % (ref 11.5–15.5)
WBC: 6.5 K/uL (ref 4.0–10.5)
nRBC: 0 % (ref 0.0–0.2)

## 2024-04-18 LAB — COMPREHENSIVE METABOLIC PANEL WITH GFR
ALT: 12 U/L (ref 0–44)
AST: 20 U/L (ref 15–41)
Albumin: 3.3 g/dL — ABNORMAL LOW (ref 3.5–5.0)
Alkaline Phosphatase: 108 U/L (ref 38–126)
Anion gap: 15 (ref 5–15)
BUN: 9 mg/dL (ref 8–23)
CO2: 23 mmol/L (ref 22–32)
Calcium: 9.6 mg/dL (ref 8.9–10.3)
Chloride: 103 mmol/L (ref 98–111)
Creatinine, Ser: 0.64 mg/dL (ref 0.44–1.00)
GFR, Estimated: >60 mL/min (ref >60)
Glucose, Bld: 84 mg/dL (ref 70–99)
Potassium: 3.6 mmol/L (ref 3.5–5.1)
Sodium: 141 mmol/L (ref 135–145)
Total Bilirubin: 0.5 mg/dL (ref 0.0–1.2)
Total Protein: 6.5 g/dL (ref 6.5–8.1)

## 2024-04-18 LAB — MAGNESIUM: Magnesium: 1.9 mg/dL (ref 1.7–2.4)

## 2024-04-18 MED ORDER — SODIUM CHLORIDE 0.9 % IV BOLUS
500.0000 mL | Freq: Once | INTRAVENOUS | Status: AC
  Administered 2024-04-18: 500 mL via INTRAVENOUS

## 2024-04-18 MED ORDER — FIDAXOMICIN 200 MG PO TABS: 200.0000 mg | ORAL_TABLET | ORAL | Status: DC

## 2024-04-18 MED ORDER — VANCOMYCIN HCL 125 MG PO CAPS
125.0000 mg | ORAL_CAPSULE | Freq: Four times a day (QID) | ORAL | 0 refills | Status: AC
  Filled 2024-04-18: qty 40, 10d supply, fill #0

## 2024-04-18 MED ORDER — FIDAXOMICIN 200 MG PO TABS
200.0000 mg | ORAL_TABLET | Freq: Two times a day (BID) | ORAL | 0 refills | Status: DC
  Filled 2024-04-18: qty 20, 10d supply, fill #0

## 2024-04-18 NOTE — Discharge Instructions (Signed)
 You were seen for your diarrhea (C. difficile) in the emergency department.   At home, please take the oral vancomycin and a probiotic.    Check your MyChart online for the results of any tests that had not resulted by the time you left the emergency department.   Follow-up with your primary doctor in 2-3 days regarding your visit.  Follow-up with the gastroenterologist  Return immediately to the emergency department if you experience any of the following: Worsening pain, high fevers despite the medicine, or any other concerning symptoms.    Thank you for visiting our Emergency Department. It was a pleasure taking care of you today.

## 2024-04-18 NOTE — ED Triage Notes (Signed)
 Pt c/o diarrhea for weeks. Recently seen for same by pcp

## 2024-04-18 NOTE — ED Provider Notes (Addendum)
 [place] EMERGENCY DEPARTMENT AT [facility] Provider Note   CSN: [id number] Arrival date & time: 04/18/24  1105     Patient presents with: Diarrhea   Melissa Sosa is a 78 y.o. female.   78 year old female history of C. difficile who presents to the emergency department with diarrhea.  Patient reports that for the past few weeks has been having diarrhea intermittently.  Was seen for this on 11/13 in the emergency department had a CT that showed possible mild colitis versus underdistention.  Received Augmentin  for treatment for urinary tract infection.  She unfortunately continued to have diarrhea and fell and was seen again on 11/15.  She had a C. difficile sample that was sent that was antigen positive but toxin negative which later had a confirmatory test that showed little toxin production but recommended treatment if she remains symptomatic.  Apparently was called on 11/21 about this but said that her diarrhea had improved.  She tells me that since then she has been having intermittent bouts of diarrhea as well as fevers and chills and abdominal cramping       Prior to Admission medications   Medication Sig Start Date End Date Taking? Authorizing Provider  vancomycin (VANCOCIN) 125 MG capsule Take 1 capsule (125 mg total) by mouth 4 (four) times daily for 10 days. 04/18/24 04/28/24 Yes Yolande Lamar BROCKS, MD  acetaminophen  (TYLENOL ) 500 MG tablet Take 2 tablets (1,000 mg total) by mouth every 6 (six) hours as needed. Patient taking differently: Take 500-1,000 mg by mouth every 6 (six) hours as needed. 09/07/23   Diona Perkins, MD  apixaban  (ELIQUIS ) 5 MG TABS tablet Take 1 tablet (5 mg total) by mouth 2 (two) times daily. 03/12/24   Kate Lonni CROME, MD  aspirin  EC 81 MG tablet Take 1 tablet (81 mg total) by mouth daily. Swallow whole. 02/02/24   Laurence Locus, DO  dapagliflozin  propanediol (FARXIGA ) 10 MG TABS tablet Take 1 tablet (10 mg total) by mouth daily. 02/07/24    Bensimhon, Toribio SAUNDERS, MD  furosemide  (LASIX ) 40 MG tablet Take one tablet  (40 mg) for weight gain greater than 3 pounds in 24 hours or 5 pounds in a week. 12/30/23   Colletta Manuelita Garre, PA-C  levETIRAcetam  (KEPPRA ) 750 MG tablet Take 1 tablet (750 mg total) by mouth 2 (two) times daily. 02/06/24 05/01/25  Camara, Amadou, MD  metoprolol  succinate (TOPROL -XL) 50 MG 24 hr tablet Take 1 tablet (50 mg total) by mouth daily. Take with or immediately following a meal. 03/23/24   Bensimhon, Toribio SAUNDERS, MD  Multiple Vitamin (MULTIVITAMIN) tablet Take 1 tablet by mouth daily.    [provider]  nortriptyline  (PAMELOR ) 25 MG capsule Take 25 mg by mouth at bedtime.    [provider]  nystatin (MYCOSTATIN/NYSTOP) powder Apply 1 Application topically See admin instructions. Application Topical 2-3 Times Daily 01/28/24   [provider]  rosuvastatin  (CRESTOR ) 20 MG tablet Take 1 tablet (20 mg total) by mouth daily. 02/02/24 05/02/24  Laurence Locus, DO  sacubitril -valsartan  (ENTRESTO ) 24-26 MG Take 1 tablet by mouth 2 (two) times daily. 01/16/24   Colletta Manuelita Garre, PA-C    Allergies: Sulfamethoxazole-trimethoprim, Venlafaxine, Imitrex [sumatriptan], and Morphine and codeine    Review of Systems  Updated Vital Signs BP (!) 116/55   Pulse 81   Temp (!) 97.5 F (36.4 C)   Resp 18   Ht 5' 6 (1.676 m)   Wt 81.2 kg   SpO2 97%   BMI  28.89 kg/m   Physical Exam Vitals and nursing note reviewed.  Constitutional:      General: She is not in acute distress.    Appearance: She is well-developed.  HENT:     Head: Normocephalic and atraumatic.     Right Ear: External ear normal.     Left Ear: External ear normal.     Nose: Nose normal.  Eyes:     Extraocular Movements: Extraocular movements intact.     Conjunctiva/sclera: Conjunctivae normal.     Pupils: Pupils are equal, round, and reactive to light.  Abdominal:     General: Abdomen is flat. There is no distension.      Palpations: Abdomen is soft. There is no mass.     Tenderness: There is no abdominal tenderness. There is no guarding.  Musculoskeletal:     Cervical back: Normal range of motion and neck supple.     Right lower leg: No edema.     Left lower leg: No edema.  Skin:    General: Skin is warm and dry.  Neurological:     Mental Status: She is alert and oriented to person, place, and time. Mental status is at baseline.  Psychiatric:        Mood and Affect: Mood normal.     (all labs ordered are listed, but only abnormal results are displayed) Labs Reviewed  CBC WITH DIFFERENTIAL/PLATELET - Abnormal; Notable for the following components:      Result Value   Platelets 403 (*)    All other components within normal limits  COMPREHENSIVE METABOLIC PANEL WITH GFR - Abnormal; Notable for the following components:   Albumin 3.3 (*)    All other components within normal limits  MAGNESIUM     EKG: None  Radiology: No results found.   Procedures   Medications Ordered in the ED  sodium chloride  0.9 % bolus 500 mL (0 mLs Intravenous Stopped 04/18/24 1448)                                    Medical Decision Making Amount and/or Complexity of Data Reviewed Labs: ordered.  Risk Prescription drug management.   Melissa Sosa is a 78 year old female history of C. difficile who presents to the emergency department with diarrhea  Initial Ddx:  C. difficile colitis, intra-abdominal abscess, electrolyte abnormality, AKI, dehydration  MDM/Course:  Patient presents to the emergency department with persistent abdominal pain and diarrhea.  Has been happening intermittently over the past few weeks.  Tested positive for toxigenic C. difficile with little to no toxin production but was still recommended treatment if her symptoms persisted.  Still was having frequent diarrhea and some abdominal cramping.  Intermittent fevers at home but here is afebrile.  Vital signs otherwise reassuring.  No  significant abdominal tenderness to palpation.  Had blood work sent that did not show any significant electrolyte abnormalities.  No AKI.  Was given some IV fluids and upon re-evaluation remained stable.  Based on the prior C. difficile results we will go ahead and start her on oral vancomycin at this point in time for suspected C. difficile infection and have her follow-up with her primary doctor and GI as an outpatient.  Counseled also take a probiotic.  Oral vancomycin delivered to the bedside and given to the patient.  Return precautions discussed with the patient and her son prior to discharge  This patient presents to the  ED for concern of complaints listed in HPI, this involves an extensive number of treatment options, and is a complaint that carries with it a high risk of complications and morbidity. Disposition including potential need for admission considered.   Dispo: DC Home. Return precautions discussed including, but not limited to, those listed in the AVS. Allowed pt time to ask questions which were answered fully prior to dc.  Additional history obtained from son Records reviewed Outpatient Clinic Notes The following labs were independently interpreted: Chemistry and show no acute abnormality I personally reviewed and interpreted cardiac monitoring: normal sinus rhythm  I personally reviewed and interpreted the pt's EKG: see above for interpretation  I have reviewed the patients home medications and made adjustments as needed Social Determinants of health:  Geriatric  Portions of this note were generated with Scientist, clinical (histocompatibility and immunogenetics). Dictation errors may occur despite best attempts at proofreading.     Final diagnoses:  C. difficile diarrhea    ED Discharge Orders          Ordered    fidaxomicin (DIFICID) 200 MG TABS tablet  2 times daily,   Status:  Discontinued        04/18/24 1404    vancomycin (VANCOCIN) 125 MG capsule  4 times daily        04/18/24 1406                Yolande Lamar BROCKS, MD 04/18/24 1459    Yolande Lamar BROCKS, MD 04/18/24 1459

## 2024-04-19 DIAGNOSIS — A0472 Enterocolitis due to Clostridium difficile, not specified as recurrent: Secondary | ICD-10-CM | POA: Diagnosis not present

## 2024-04-20 ENCOUNTER — Other Ambulatory Visit (HOSPITAL_BASED_OUTPATIENT_CLINIC_OR_DEPARTMENT_OTHER): Payer: Self-pay

## 2024-04-29 ENCOUNTER — Other Ambulatory Visit (HOSPITAL_BASED_OUTPATIENT_CLINIC_OR_DEPARTMENT_OTHER): Payer: Self-pay

## 2024-04-30 DIAGNOSIS — A0472 Enterocolitis due to Clostridium difficile, not specified as recurrent: Secondary | ICD-10-CM | POA: Diagnosis not present

## 2024-05-19 ENCOUNTER — Ambulatory Visit (HOSPITAL_COMMUNITY)
Admission: RE | Admit: 2024-05-19 | Discharge: 2024-05-19 | Disposition: A | Discharge status: Home / Self Care | Source: Ambulatory Visit | Attending: Cardiovascular Disease | Admitting: Cardiovascular Disease

## 2024-05-19 DIAGNOSIS — I519 Heart disease, unspecified: Secondary | ICD-10-CM | POA: Diagnosis not present

## 2024-05-20 LAB — ECHOCARDIOGRAM LIMITED
Area-P 1/2: 2.44 cm2
S' Lateral: 2.7 cm

## 2024-05-25 ENCOUNTER — Ambulatory Visit: Admitting: Cardiology

## 2024-05-26 ENCOUNTER — Ambulatory Visit: Payer: Self-pay | Admitting: Physician Assistant

## 2024-05-26 NOTE — Progress Notes (Unsigned)
 " Cardiology Office Note:    Date:  05/27/2024   ID:  Melissa Sosa, DOB Jul 02, 1945, MRN [id number]  PCP:  Ricky Alfrieda DASEN, DO  Cardiologist:  Lonni LITTIE Nanas, MD  Electrophysiologist:  None   Referring MD: Ricky Alfrieda DASEN, DO   Chief Complaint  Patient presents with   Congestive Heart Failure    History of Present Illness:    Melissa Sosa is a 79 y.o. female with a hx of chronic systolic heart failure, atrial fibrillation, breast cancer, cervical cancer, non-Hodgkin's lymphoma, PMR, hypertension who presents for follow-up.  Echocardiogram 08/2023 showed normal biventricular function, no significant valvular disease.  She was admitted 11/2023 with new onset A-fib with RVR.  Echocardiogram showed EF 40 to 45%.  She spontaneously converted to sinus rhythm and was started on Eliquis  and metoprolol .  Presented back to ED 12/2023 with A-fib with RVR and decompensated heart failure.  She was diuresed with IV Lasix  and spontaneously converted to sinus rhythm.  Echocardiogram 01/2024 showed EF 40 to 45%.  She was admitted 01/2024 with acute CVA, started on aspirin  along with rosuvastatin  in addition to Eliquis ..  Echo 04/2024 showed EF 40-45%.    Since last clinic visit, she reports she is doing well.  Denies any chest pain but does report some dyspnea.  Reports some lightheadedness but denies any syncope.  No lower extremity edema or palpitations.  She has only used Lasix  twice over the last several months.  Reports weight has been decreasing.  She denies any bleeding on Eliquis .    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  Current Medications: Active Medications[1]   Allergies:    Sulfamethoxazole-trimethoprim, Venlafaxine, Codeine, Imitrex [sumatriptan], Morphine, Morphine and codeine, and Tramadol   Social History   Socioeconomic History   Marital status: Widowed    Spouse name: Not on file   Number of children: Not on file   Years of education: Not on file   Highest education level: Not on file  Occupational History   Not on file  Tobacco Use   Smoking status: Never   Smokeless tobacco: Never  Vaping Use   Vaping status: Never Used  Substance and Sexual Activity   Alcohol use: No   Drug use: Never   Sexual activity: Not on file  Other Topics Concern   Not on file  Social History Narrative   Right handed   Caffeine 1 cup daily   Lives alone   Social Drivers of Health   Tobacco Use: Low Risk (05/27/2024)   Patient History    Smoking Tobacco Use: Never    Smokeless Tobacco Use: Never    Passive Exposure: Not on file  Financial Resource Strain: Low Risk (12/27/2023)   Overall Financial Resource Strain (CARDIA)    Difficulty of Paying Living Expenses: Not hard at all  Food Insecurity: No Food Insecurity (12/24/2023)   Epic    Worried About Radiation Protection Practitioner of Food in the Last Year: Never true    Ran Out of Food in the Last Year: Never true  Transportation Needs: No Transportation Needs (12/24/2023)   Epic    Lack of Transportation (Medical):  No    Lack of Transportation (Non-Medical): No  Physical Activity: Not on file  Stress: Not on file  Social Connections: Patient Declined (12/24/2023)   Social Connection and Isolation Panel    Frequency of Communication with Friends and Family: Patient declined    Frequency of Social Gatherings with Friends and Family: Patient declined    Attends Religious Services: Patient declined    Active Member of Clubs or Organizations: Patient declined    Attends Banker Meetings: Patient declined    Marital Status: Patient declined  Depression (PHQ2-9): Not on file  Alcohol Screen: Low Risk (12/27/2023)    Alcohol Screen    Last Alcohol Screening Score (AUDIT): 0  Housing: Low Risk (12/24/2023)   Epic    Unable to Pay for Housing in the Last Year: No    Number of Times Moved in the Last Year: 0    Homeless in the Last Year: No  Utilities: Not At Risk (12/24/2023)   Epic    Threatened with loss of utilities: No  Health Literacy: Not on file     Family History: The patient's family history is not on file.  ROS:   Please see the history of present illness.     All other systems reviewed and are negative.  EKGs/Labs/Other Studies Reviewed:    The following studies were reviewed today:   EKG:   05/27/2024: Normal sinus rhythm, rate 94, T wave inversion in leads I, aVL  Recent Labs: 12/10/2023: TSH 0.799 12/30/2023: B Natriuretic Peptide 522.1 04/18/2024: ALT 12; BUN 9; Creatinine, Ser 0.64; Hemoglobin 13.4; Magnesium  1.9; Platelets 403; Potassium 3.6; Sodium 141  Recent Lipid Panel    Component Value Date/Time   CHOL 160 02/01/2024 0155   TRIG 112 02/01/2024 0155   HDL 52 02/01/2024 0155   CHOLHDL 3.1 02/01/2024 0155   VLDL 22 02/01/2024 0155   LDLCALC 86 02/01/2024 0155    Physical Exam:    VS:  BP 107/70   Pulse 99   Ht 5' 6 (1.676 m)   Wt 179 lb 3.2 oz (81.3 kg)   SpO2 96%   BMI 28.92 kg/m     Wt Readings from Last 3 Encounters:  05/27/24 179 lb 3.2 oz (81.3 kg)  04/18/24 179 lb 0.2 oz (81.2 kg)  04/04/24 185 lb (83.9 kg)     GEN:  Well nourished, well developed in no acute distress HEENT: Normal NECK: No JVD; No carotid bruits CARDIAC: RRR, no murmurs, rubs, gallops RESPIRATORY:  Clear to auscultation without rales, wheezing or rhonchi  ABDOMEN: Soft, non-tender, non-distended MUSCULOSKELETAL:  No edema; No deformity  SKIN: Warm and dry NEUROLOGIC:  Alert and oriented x 3 PSYCHIATRIC:  Normal affect   ASSESSMENT:    1. Chronic systolic CHF (congestive heart failure) (HCC) - LVEF 40-45%   2. Paroxysmal atrial fibrillation (HCC)   3. H/O: CVA  (cerebrovascular accident)    PLAN:    Chronic systolic heart failure: Echocardiogram 11/2023 with EF 40 to 45%.  Repeat echo 01/2024 in 04/2024 showed no change.  Ischemic workup was deferred initially given presented with acute CVA 01/2024 - Recommend stress PET to evaluate for ischemia - On Entresto  24-26 mg twice daily, Toprol -XL 50 mg daily, Farxiga  10 mg daily.  BP soft, will hold off on adding spironolactone at this time - On as needed Lasix , appears euvolemic - Having labs checked with PCP tomorrow  Paroxysmal atrial fibrillation: On Eliquis .  Continue Toprol -XL 50 mg daily.  Appears  maintaining sinus rhythm.  Will evaluate A-fib burden with Zio patch x 2 weeks  CVA: Admitted 01/2024 with acute CVA.  On aspirin , statin  RTC in 3 months  Informed Consent   Shared Decision Making/Informed Consent The risks [chest pain, shortness of breath, cardiac arrhythmias, dizziness, blood pressure fluctuations, myocardial infarction, stroke/transient ischemic attack, nausea, vomiting, allergic reaction, radiation exposure, metallic taste sensation and life-threatening complications (estimated to be 1 in 10,000)], benefits (risk stratification, diagnosing coronary artery disease, treatment guidance) and alternatives of a cardiac PET stress test were discussed in detail with Ms. Neth and she agrees to proceed.      Medication Adjustments/Labs and Tests Ordered: Current medicines are reviewed at length with the patient today.  Concerns regarding medicines are outlined above.  Orders Placed This Encounter  Procedures   NM PET CT CARDIAC PERFUSION MULTI W/ABSOLUTE BLOODFLOW   Cardiac Stress Test: Informed Consent Details: Physician/Practitioner Attestation; Transcribe to consent form and obtain patient signature   LONG TERM MONITOR (3-14 DAYS)   EKG 12-Lead   No orders of the defined types were placed in this encounter.   Patient Instructions  Medication Instructions:  Your physician  recommends that you continue on your current medications as directed. Please refer to the Current Medication list given to you today.  *If you need a refill on your cardiac medications before your next appointment, please call your pharmacy*  Lab Work: none If you have labs (blood work) drawn today and your tests are completely normal, you will receive your results only by: MyChart Message (if you have MyChart) OR A paper copy in the mail If you have any lab test that is abnormal or we need to change your treatment, we will call you to review the results.  Testing/Procedures:    Please report to Radiology at the [facility] Main Entrance 30 minutes early for your test.  [address] [place], [place] [address]   How to Prepare for Your Cardiac PET/CT Stress Test:  Nothing to eat or drink, except water, 3 hours prior to arrival time.  NO caffeine/decaffeinated products, or chocolate 12 hours prior to arrival. (Please note decaffeinated beverages (teas/coffees) still contain caffeine).  If you have caffeine within 12 hours prior, the test will need to be rescheduled.  Medication instructions: Do not take erectile dysfunction medications for 72 hours prior to test (sildenafil, tadalafil) Do not take nitrates (isosorbide mononitrate, Ranexa) the day before or day of test Do not take tamsulosin the day before or morning of test Hold theophylline containing medications for 12 hours. Hold Dipyridamole 48 hours prior to the test.  Diabetic Preparation: If able to eat breakfast prior to 3 hour fasting, you may take all medications, including your insulin. Do not worry if you miss your breakfast dose of insulin - start at your next meal. If you do not eat prior to 3 hour fast-Hold all diabetes (oral and insulin) medications. Patients who wear a continuous glucose monitor MUST remove the device prior to scanning.  You may take your remaining medications with water.  NO perfume,  cologne or lotion on chest or abdomen area. FEMALES - Please avoid wearing dresses to this appointment.  Total time is 1 to 2 hours; you may want to bring reading material for the waiting time.  IF YOU THINK YOU MAY BE PREGNANT, OR ARE NURSING PLEASE INFORM THE TECHNOLOGIST.  In preparation for your appointment, medication and supplies will be purchased.  Appointment availability is limited, so  if you need to cancel or reschedule, please call the Radiology Department Scheduler at [phone] 24 hours in advance to avoid a cancellation fee of $100.00  What to Expect When you Arrive:  Once you arrive and check in for your appointment, you will be taken to a preparation room within the Radiology Department.  A technologist or Nurse will obtain your medical history, verify that you are correctly prepped for the exam, and explain the procedure.  Afterwards, an IV will be started in your arm and electrodes will be placed on your skin for EKG monitoring during the stress portion of the exam. Then you will be escorted to the PET/CT scanner.  There, staff will get you positioned on the scanner and obtain a blood pressure and EKG.  During the exam, you will continue to be connected to the EKG and blood pressure machines.  A small, safe amount of a radioactive tracer will be injected in your IV to obtain a series of pictures of your heart along with an injection of a stress agent.    After your Exam:  It is recommended that you eat a meal and drink a caffeinated beverage to counter act any effects of the stress agent.  Drink plenty of fluids for the remainder of the day and urinate frequently for the first couple of hours after the exam.  Your doctor will inform you of your test results within 7-10 business days.  For more information and frequently asked questions, please visit our website: [email or web address]  For questions about your test or how to prepare for your test, please  call: Cardiac Imaging Nurse Navigators Office: [phone]   Follow-Up: At [facility], you and your health needs are our priority.  As part of our continuing mission to provide you with exceptional heart care, our providers are all part of one team.  This team includes your primary Cardiologist (physician) and Advanced Practice Providers or APPs (Physician Assistants and Nurse Practitioners) who all work together to provide you with the care you need, when you need it.  Your next appointment:   3 months  Provider:   Dr. Kate  We recommend signing up for the patient portal called MyChart.  Sign up information is provided on this After Visit Summary.  MyChart is used to connect with patients for Virtual Visits (Telemedicine).  Patients are able to view lab/test results, encounter notes, upcoming appointments, etc.  Non-urgent messages can be sent to your provider as well.   To learn more about what you can do with MyChart, go to [email or web address].   Other Instructions Zio   ZIO XT- Long Term Monitor Instructions   Your physician has requested you wear your ZIO patch monitor 14 days.   This is a single patch monitor.  [employer] supplies one patch monitor per enrollment.  Additional stickers are not available.   Please do not apply patch if you will be having a Nuclear Stress Test, Echocardiogram, Cardiac CT, MRI, or Chest Xray during the time frame you would be wearing the monitor. The patch cannot be worn during these tests.  You cannot remove and re-apply the ZIO XT patch monitor.   Your ZIO patch monitor will be sent USPS Priority mail from [facility] directly to your home address. The monitor may also be mailed to a PO BOX if home delivery is not available.   It may take 3-5 days to receive your monitor after you have been enrolled.   Once you have received  you monitor, please review enclosed instructions.  Your monitor has already been registered  assigning a specific monitor serial # to you.   Applying the monitor   Shave hair from upper left chest.   Hold abrader disc by orange tab.  Rub abrader in 40 strokes over left upper chest as indicated in your monitor instructions.   Clean area with 4 enclosed alcohol pads .  Use all pads to assure are is cleaned thoroughly.  Let dry.   Apply patch as indicated in monitor instructions.  Patch will be place under collarbone on left side of chest with arrow pointing upward.   Rub patch adhesive wings for 2 minutes.Remove white label marked 1.  Remove white label marked 2.  Rub patch adhesive wings for 2 additional minutes.   While looking in a mirror, press and release button in center of patch.  A small green light will flash 3-4 times .  This will be your only indicator the monitor has been turned on.     Do not shower for the first 24 hours.  You may shower after the first 24 hours.   Press button if you feel a symptom. You will hear a small click.  Record Date, Time and Symptom in the Patient Log Book.   When you are ready to remove patch, follow instructions on last 2 pages of Patient Log Book.  Stick patch monitor onto last page of Patient Log Book.   Place Patient Log Book in [place] box.  Use locking tab on box and tape box closed securely.  The [employer] has [employer] on it.  Please place in mailbox as soon as possible.  Your physician should have your test results approximately 7 days after the monitor has been mailed back to [facility].   Call [facility] Customer Care at [phone] if you have questions regarding your ZIO XT patch monitor.  Call them immediately if you see an orange light blinking on your monitor.   If your monitor falls off in less than 4 days contact our Monitor department at [phone].  If your monitor becomes loose or falls off after 4 days call [employer] at [phone] for suggestions on securing your monitor.              Signed, Lonni LITTIE Nanas, MD  05/27/2024 5:03 PM    [facility]     [1]  Current Meds  Medication Sig   acetaminophen  (TYLENOL ) 500 MG tablet Take 2 tablets (1,000 mg total) by mouth every 6 (six) hours as needed.   apixaban  (ELIQUIS ) 5 MG TABS tablet Take 1 tablet (5 mg total) by mouth 2 (two) times daily.   aspirin  EC 81 MG tablet Take 1 tablet (81 mg total) by mouth daily. Swallow whole.   dapagliflozin  propanediol (FARXIGA ) 10 MG TABS tablet Take 1 tablet (10 mg total) by mouth daily.   furosemide  (LASIX ) 40 MG tablet Take one tablet  (40 mg) for weight gain greater than 3 pounds in 24 hours or 5 pounds in a week.   levETIRAcetam  (KEPPRA ) 750 MG tablet Take 1 tablet (750 mg total) by mouth 2 (two) times daily.   metoprolol  succinate (TOPROL -XL) 50 MG 24 hr tablet Take 1 tablet (50 mg total) by mouth daily. Take with or immediately following a meal.   Multiple Vitamin (MULTIVITAMIN) tablet Take 1 tablet by mouth daily.   nortriptyline  (PAMELOR ) 25 MG capsule Take 25 mg by mouth at  bedtime.   nystatin (MYCOSTATIN/NYSTOP) powder Apply 1 Application topically See admin instructions. Application Topical 2-3 Times Daily   sacubitril -valsartan  (ENTRESTO ) 24-26 MG Take 1 tablet by mouth 2 (two) times daily.   "

## 2024-05-27 ENCOUNTER — Encounter: Payer: Self-pay | Admitting: Cardiology

## 2024-05-27 ENCOUNTER — Ambulatory Visit: Admitting: Cardiology

## 2024-05-27 ENCOUNTER — Ambulatory Visit

## 2024-05-27 VITALS — BP 107/70 | HR 99 | Ht 66.0 in | Wt 179.2 lb

## 2024-05-27 DIAGNOSIS — I5022 Chronic systolic (congestive) heart failure: Secondary | ICD-10-CM

## 2024-05-27 DIAGNOSIS — Z8673 Personal history of transient ischemic attack (TIA), and cerebral infarction without residual deficits: Secondary | ICD-10-CM | POA: Diagnosis not present

## 2024-05-27 DIAGNOSIS — I480 Paroxysmal atrial fibrillation: Secondary | ICD-10-CM

## 2024-05-27 NOTE — Progress Notes (Unsigned)
 Enrolled patient for a 14 day Zio XT  monitor to be mailed to patients home

## 2024-05-27 NOTE — Patient Instructions (Addendum)
 Medication Instructions:  Your physician recommends that you continue on your current medications as directed. Please refer to the Current Medication list given to you today.  *If you need a refill on your cardiac medications before your next appointment, please call your pharmacy*  Lab Work: none If you have labs (blood work) drawn today and your tests are completely normal, you will receive your results only by: MyChart Message (if you have MyChart) OR A paper copy in the mail If you have any lab test that is abnormal or we need to change your treatment, we will call you to review the results.  Testing/Procedures:    Please report to Radiology at the [facility] Main Entrance 30 minutes early for your test.  [address] [place], [place] [address]   How to Prepare for Your Cardiac PET/CT Stress Test:  Nothing to eat or drink, except water, 3 hours prior to arrival time.  NO caffeine/decaffeinated products, or chocolate 12 hours prior to arrival. (Please note decaffeinated beverages (teas/coffees) still contain caffeine).  If you have caffeine within 12 hours prior, the test will need to be rescheduled.  Medication instructions: Do not take erectile dysfunction medications for 72 hours prior to test (sildenafil, tadalafil) Do not take nitrates (isosorbide mononitrate, Ranexa) the day before or day of test Do not take tamsulosin the day before or morning of test Hold theophylline containing medications for 12 hours. Hold Dipyridamole 48 hours prior to the test.  Diabetic Preparation: If able to eat breakfast prior to 3 hour fasting, you may take all medications, including your insulin. Do not worry if you miss your breakfast dose of insulin - start at your next meal. If you do not eat prior to 3 hour fast-Hold all diabetes (oral and insulin) medications. Patients who wear a continuous glucose monitor MUST remove the device prior to scanning.  You may take your  remaining medications with water.  NO perfume, cologne or lotion on chest or abdomen area. FEMALES - Please avoid wearing dresses to this appointment.  Total time is 1 to 2 hours; you may want to bring reading material for the waiting time.  IF YOU THINK YOU MAY BE PREGNANT, OR ARE NURSING PLEASE INFORM THE TECHNOLOGIST.  In preparation for your appointment, medication and supplies will be purchased.  Appointment availability is limited, so if you need to cancel or reschedule, please call the Radiology Department Scheduler at [phone] 24 hours in advance to avoid a cancellation fee of $100.00  What to Expect When you Arrive:  Once you arrive and check in for your appointment, you will be taken to a preparation room within the Radiology Department.  A technologist or Nurse will obtain your medical history, verify that you are correctly prepped for the exam, and explain the procedure.  Afterwards, an IV will be started in your arm and electrodes will be placed on your skin for EKG monitoring during the stress portion of the exam. Then you will be escorted to the PET/CT scanner.  There, staff will get you positioned on the scanner and obtain a blood pressure and EKG.  During the exam, you will continue to be connected to the EKG and blood pressure machines.  A small, safe amount of a radioactive tracer will be injected in your IV to obtain a series of pictures of your heart along with an injection of a stress agent.    After your Exam:  It is recommended that you eat a meal  and drink a caffeinated beverage to counter act any effects of the stress agent.  Drink plenty of fluids for the remainder of the day and urinate frequently for the first couple of hours after the exam.  Your doctor will inform you of your test results within 7-10 business days.  For more information and frequently asked questions, please visit our website: [email or web address]  For questions about your test  or how to prepare for your test, please call: Cardiac Imaging Nurse Navigators Office: [phone]   Follow-Up: At [facility], you and your health needs are our priority.  As part of our continuing mission to provide you with exceptional heart care, our providers are all part of one team.  This team includes your primary Cardiologist (physician) and Advanced Practice Providers or APPs (Physician Assistants and Nurse Practitioners) who all work together to provide you with the care you need, when you need it.  Your next appointment:   3 months  Provider:   Dr. Kate  We recommend signing up for the patient portal called MyChart.  Sign up information is provided on this After Visit Summary.  MyChart is used to connect with patients for Virtual Visits (Telemedicine).  Patients are able to view lab/test results, encounter notes, upcoming appointments, etc.  Non-urgent messages can be sent to your provider as well.   To learn more about what you can do with MyChart, go to [email or web address].   Other Instructions Zio   ZIO XT- Long Term Monitor Instructions   Your physician has requested you wear your ZIO patch monitor 14 days.   This is a single patch monitor.  [employer] supplies one patch monitor per enrollment.  Additional stickers are not available.   Please do not apply patch if you will be having a Nuclear Stress Test, Echocardiogram, Cardiac CT, MRI, or Chest Xray during the time frame you would be wearing the monitor. The patch cannot be worn during these tests.  You cannot remove and re-apply the ZIO XT patch monitor.   Your ZIO patch monitor will be sent USPS Priority mail from [facility] directly to your home address. The monitor may also be mailed to a PO BOX if home delivery is not available.   It may take 3-5 days to receive your monitor after you have been enrolled.   Once you have received you monitor, please review enclosed instructions.  Your  monitor has already been registered assigning a specific monitor serial # to you.   Applying the monitor   Shave hair from upper left chest.   Hold abrader disc by orange tab.  Rub abrader in 40 strokes over left upper chest as indicated in your monitor instructions.   Clean area with 4 enclosed alcohol pads .  Use all pads to assure are is cleaned thoroughly.  Let dry.   Apply patch as indicated in monitor instructions.  Patch will be place under collarbone on left side of chest with arrow pointing upward.   Rub patch adhesive wings for 2 minutes.Remove white label marked 1.  Remove white label marked 2.  Rub patch adhesive wings for 2 additional minutes.   While looking in a mirror, press and release button in center of patch.  A small green light will flash 3-4 times .  This will be your only indicator the monitor has been turned on.     Do not shower for the first 24 hours.  You may shower after the first 24 hours.  Press button if you feel a symptom. You will hear a small click.  Record Date, Time and Symptom in the Patient Log Book.   When you are ready to remove patch, follow instructions on last 2 pages of Patient Log Book.  Stick patch monitor onto last page of Patient Log Book.   Place Patient Log Book in [place] box.  Use locking tab on box and tape box closed securely.  The [employer] has [employer] on it.  Please place in mailbox as soon as possible.  Your physician should have your test results approximately 7 days after the monitor has been mailed back to [facility].   Call [facility] Customer Care at [phone] if you have questions regarding your ZIO XT patch monitor.  Call them immediately if you see an orange light blinking on your monitor.   If your monitor falls off in less than 4 days contact our Monitor department at [phone].  If your monitor becomes loose or falls off after 4 days call [employer] at [phone] for suggestions on  securing your monitor.

## 2024-05-28 ENCOUNTER — Encounter: Payer: Self-pay | Admitting: Cardiology

## 2024-05-29 MED ORDER — APIXABAN 5 MG PO TABS: 5.0000 mg | ORAL_TABLET | Freq: Two times a day (BID) | ORAL | 1 refills | Status: AC

## 2024-05-29 MED ORDER — DAPAGLIFLOZIN PROPANEDIOL 10 MG PO TABS: 10.0000 mg | ORAL_TABLET | Freq: Every day | ORAL | 3 refills | Status: AC

## 2024-05-29 MED ORDER — SACUBITRIL-VALSARTAN 24-26 MG PO TABS: 1.0000 | ORAL_TABLET | Freq: Two times a day (BID) | ORAL | 3 refills | Status: AC

## 2024-06-02 ENCOUNTER — Ambulatory Visit: Admitting: Cardiology

## 2024-06-03 ENCOUNTER — Encounter: Payer: Self-pay | Admitting: Cardiology

## 2024-06-05 ENCOUNTER — Ambulatory Visit: Attending: Cardiology

## 2024-06-30 ENCOUNTER — Encounter (HOSPITAL_COMMUNITY)

## 2024-07-21 ENCOUNTER — Other Ambulatory Visit (HOSPITAL_COMMUNITY)

## 2025-02-11 ENCOUNTER — Ambulatory Visit: Admitting: Neurology
